# Patient Record
Sex: Female | Born: 1975 | Race: Black or African American | Hispanic: No | Marital: Single | Smoking: Former smoker
Health system: Southern US, Community
[De-identification: ages and names within clinical notes are randomized; demographics above are authoritative.]

## PROBLEM LIST (undated history)

## (undated) ENCOUNTER — Emergency Department (HOSPITAL_COMMUNITY): Admission: EM | Payer: Medicaid Other

## (undated) DIAGNOSIS — R21 Rash and other nonspecific skin eruption: Secondary | ICD-10-CM

## (undated) DIAGNOSIS — M1990 Unspecified osteoarthritis, unspecified site: Secondary | ICD-10-CM

## (undated) DIAGNOSIS — Z8042 Family history of malignant neoplasm of prostate: Secondary | ICD-10-CM

## (undated) DIAGNOSIS — E669 Obesity, unspecified: Secondary | ICD-10-CM

## (undated) DIAGNOSIS — G4730 Sleep apnea, unspecified: Secondary | ICD-10-CM

## (undated) DIAGNOSIS — N946 Dysmenorrhea, unspecified: Secondary | ICD-10-CM

## (undated) DIAGNOSIS — M659 Unspecified synovitis and tenosynovitis, unspecified site: Secondary | ICD-10-CM

## (undated) DIAGNOSIS — Z87898 Personal history of other specified conditions: Secondary | ICD-10-CM

## (undated) DIAGNOSIS — G5600 Carpal tunnel syndrome, unspecified upper limb: Secondary | ICD-10-CM

## (undated) DIAGNOSIS — G5602 Carpal tunnel syndrome, left upper limb: Secondary | ICD-10-CM

## (undated) DIAGNOSIS — R112 Nausea with vomiting, unspecified: Secondary | ICD-10-CM

## (undated) DIAGNOSIS — Z803 Family history of malignant neoplasm of breast: Secondary | ICD-10-CM

## (undated) DIAGNOSIS — K219 Gastro-esophageal reflux disease without esophagitis: Secondary | ICD-10-CM

## (undated) DIAGNOSIS — Z9889 Other specified postprocedural states: Secondary | ICD-10-CM

## (undated) DIAGNOSIS — R51 Headache: Secondary | ICD-10-CM

## (undated) DIAGNOSIS — M722 Plantar fascial fibromatosis: Secondary | ICD-10-CM

## (undated) HISTORY — PX: HERNIA REPAIR: SHX51

## (undated) HISTORY — PX: TOE SURGERY: SHX1073

## (undated) HISTORY — DX: Unspecified synovitis and tenosynovitis, unspecified site: M65.90

## (undated) HISTORY — DX: Dysmenorrhea, unspecified: N94.6

## (undated) HISTORY — DX: Synovitis and tenosynovitis, unspecified: M65.9

## (undated) HISTORY — DX: Family history of malignant neoplasm of breast: Z80.3

## (undated) HISTORY — PX: CHOLECYSTECTOMY: SHX55

## (undated) HISTORY — DX: Unspecified osteoarthritis, unspecified site: M19.90

## (undated) HISTORY — DX: Family history of malignant neoplasm of prostate: Z80.42

## (undated) HISTORY — DX: Plantar fascial fibromatosis: M72.2

## (undated) HISTORY — PX: HYSTEROPLASTY REPAIR OF UTERINE ANOMALY: SUR663

---

## 1997-06-15 ENCOUNTER — Encounter: Admission: RE | Admit: 1997-06-15 | Discharge: 1997-09-13 | Payer: Self-pay | Admitting: Obstetrics & Gynecology

## 1997-11-30 ENCOUNTER — Encounter: Admission: RE | Admit: 1997-11-30 | Discharge: 1998-02-28 | Payer: Self-pay | Admitting: Obstetrics & Gynecology

## 1998-11-15 ENCOUNTER — Emergency Department (HOSPITAL_COMMUNITY): Admission: EM | Admit: 1998-11-15 | Discharge: 1998-11-15 | Payer: Self-pay | Admitting: Emergency Medicine

## 1998-11-15 ENCOUNTER — Encounter: Payer: Self-pay | Admitting: Emergency Medicine

## 1999-05-28 ENCOUNTER — Encounter: Admission: RE | Admit: 1999-05-28 | Discharge: 1999-08-26 | Payer: Self-pay | Admitting: Family Medicine

## 1999-08-25 ENCOUNTER — Other Ambulatory Visit: Admission: RE | Admit: 1999-08-25 | Discharge: 1999-08-25 | Payer: Self-pay | Admitting: Obstetrics

## 1999-11-06 ENCOUNTER — Ambulatory Visit (HOSPITAL_COMMUNITY): Admission: RE | Admit: 1999-11-06 | Discharge: 1999-11-06 | Payer: Self-pay | Admitting: *Deleted

## 1999-11-06 ENCOUNTER — Encounter: Payer: Self-pay | Admitting: *Deleted

## 1999-12-15 ENCOUNTER — Inpatient Hospital Stay (HOSPITAL_COMMUNITY): Admission: AD | Admit: 1999-12-15 | Discharge: 1999-12-15 | Payer: Self-pay | Admitting: Obstetrics & Gynecology

## 2000-01-23 ENCOUNTER — Inpatient Hospital Stay (HOSPITAL_COMMUNITY): Admission: AD | Admit: 2000-01-23 | Discharge: 2000-01-23 | Payer: Self-pay | Admitting: Obstetrics & Gynecology

## 2000-02-11 ENCOUNTER — Ambulatory Visit (HOSPITAL_COMMUNITY): Admission: RE | Admit: 2000-02-11 | Discharge: 2000-02-11 | Payer: Self-pay | Admitting: Obstetrics & Gynecology

## 2000-02-20 ENCOUNTER — Inpatient Hospital Stay (HOSPITAL_COMMUNITY): Admission: AD | Admit: 2000-02-20 | Discharge: 2000-02-20 | Payer: Self-pay | Admitting: Obstetrics and Gynecology

## 2000-02-23 ENCOUNTER — Observation Stay (HOSPITAL_COMMUNITY): Admission: AD | Admit: 2000-02-23 | Discharge: 2000-02-24 | Payer: Self-pay | Admitting: Obstetrics and Gynecology

## 2000-03-08 ENCOUNTER — Inpatient Hospital Stay (HOSPITAL_COMMUNITY): Admission: AD | Admit: 2000-03-08 | Discharge: 2000-03-08 | Payer: Self-pay | Admitting: Obstetrics and Gynecology

## 2000-03-11 ENCOUNTER — Encounter: Payer: Self-pay | Admitting: Obstetrics and Gynecology

## 2000-03-11 ENCOUNTER — Inpatient Hospital Stay (HOSPITAL_COMMUNITY): Admission: RE | Admit: 2000-03-11 | Discharge: 2000-03-15 | Payer: Self-pay | Admitting: Obstetrics and Gynecology

## 2000-03-19 ENCOUNTER — Inpatient Hospital Stay (HOSPITAL_COMMUNITY): Admission: AD | Admit: 2000-03-19 | Discharge: 2000-03-19 | Payer: Self-pay | Admitting: Obstetrics and Gynecology

## 2000-04-23 ENCOUNTER — Encounter: Payer: Self-pay | Admitting: Emergency Medicine

## 2000-04-23 ENCOUNTER — Emergency Department (HOSPITAL_COMMUNITY): Admission: EM | Admit: 2000-04-23 | Discharge: 2000-04-23 | Payer: Self-pay | Admitting: Emergency Medicine

## 2000-06-17 ENCOUNTER — Encounter (HOSPITAL_BASED_OUTPATIENT_CLINIC_OR_DEPARTMENT_OTHER): Payer: Self-pay | Admitting: General Surgery

## 2000-06-21 ENCOUNTER — Encounter (HOSPITAL_BASED_OUTPATIENT_CLINIC_OR_DEPARTMENT_OTHER): Payer: Self-pay | Admitting: General Surgery

## 2000-06-21 ENCOUNTER — Ambulatory Visit (HOSPITAL_COMMUNITY): Admission: RE | Admit: 2000-06-21 | Discharge: 2000-06-22 | Payer: Self-pay | Admitting: General Surgery

## 2000-09-30 ENCOUNTER — Emergency Department (HOSPITAL_COMMUNITY): Admission: EM | Admit: 2000-09-30 | Discharge: 2000-09-30 | Payer: Self-pay

## 2000-10-08 ENCOUNTER — Ambulatory Visit (HOSPITAL_COMMUNITY): Admission: RE | Admit: 2000-10-08 | Discharge: 2000-10-08 | Payer: Self-pay | Admitting: General Surgery

## 2002-02-09 ENCOUNTER — Emergency Department (HOSPITAL_COMMUNITY): Admission: EM | Admit: 2002-02-09 | Discharge: 2002-02-09 | Payer: Self-pay | Admitting: Emergency Medicine

## 2003-11-10 ENCOUNTER — Inpatient Hospital Stay (HOSPITAL_COMMUNITY): Admission: AD | Admit: 2003-11-10 | Discharge: 2003-11-10 | Payer: Self-pay | Admitting: Obstetrics and Gynecology

## 2003-12-17 ENCOUNTER — Ambulatory Visit (HOSPITAL_COMMUNITY): Admission: RE | Admit: 2003-12-17 | Discharge: 2003-12-17 | Payer: Self-pay | Admitting: Obstetrics and Gynecology

## 2003-12-24 ENCOUNTER — Inpatient Hospital Stay (HOSPITAL_COMMUNITY): Admission: RE | Admit: 2003-12-24 | Discharge: 2003-12-27 | Payer: Self-pay | Admitting: Obstetrics and Gynecology

## 2007-01-24 HISTORY — PX: COLONOSCOPY: SHX174

## 2010-03-15 ENCOUNTER — Encounter: Payer: Self-pay | Admitting: Obstetrics and Gynecology

## 2010-05-21 ENCOUNTER — Emergency Department (HOSPITAL_COMMUNITY)
Admission: EM | Admit: 2010-05-21 | Discharge: 2010-05-21 | Disposition: A | Discharge status: Home / Self Care | Payer: Self-pay | Attending: Emergency Medicine | Admitting: Emergency Medicine

## 2010-05-21 DIAGNOSIS — K047 Periapical abscess without sinus: Secondary | ICD-10-CM | POA: Insufficient documentation

## 2010-05-21 DIAGNOSIS — K089 Disorder of teeth and supporting structures, unspecified: Secondary | ICD-10-CM | POA: Insufficient documentation

## 2010-05-21 DIAGNOSIS — K029 Dental caries, unspecified: Secondary | ICD-10-CM | POA: Insufficient documentation

## 2010-07-24 ENCOUNTER — Inpatient Hospital Stay (HOSPITAL_COMMUNITY)
Admission: AD | Admit: 2010-07-24 | Discharge: 2010-07-24 | Disposition: A | Discharge status: Home / Self Care | Payer: Self-pay | Source: Ambulatory Visit | Attending: Obstetrics & Gynecology | Admitting: Obstetrics & Gynecology

## 2010-07-24 ENCOUNTER — Inpatient Hospital Stay (HOSPITAL_COMMUNITY): Payer: Self-pay

## 2010-07-24 DIAGNOSIS — N949 Unspecified condition associated with female genital organs and menstrual cycle: Secondary | ICD-10-CM

## 2010-07-24 DIAGNOSIS — N938 Other specified abnormal uterine and vaginal bleeding: Secondary | ICD-10-CM | POA: Insufficient documentation

## 2010-07-24 LAB — WET PREP, GENITAL
Clue Cells Wet Prep HPF POC: NONE SEEN
Trich, Wet Prep: NONE SEEN
Yeast Wet Prep HPF POC: NONE SEEN

## 2010-07-24 LAB — CBC
HCT: 42.4 % (ref 36.0–46.0)
Hemoglobin: 13.7 g/dL (ref 12.0–15.0)
MCH: 28.9 pg (ref 26.0–34.0)
MCHC: 32.3 g/dL (ref 30.0–36.0)
MCV: 89.5 fL (ref 78.0–100.0)
Platelets: 304 10*3/uL (ref 150–400)
RBC: 4.74 MIL/uL (ref 3.87–5.11)
RDW: 14.1 % (ref 11.5–15.5)
WBC: 13.1 10*3/uL — ABNORMAL HIGH (ref 4.0–10.5)

## 2010-07-24 LAB — URINE MICROSCOPIC-ADD ON

## 2010-07-24 LAB — URINALYSIS, ROUTINE W REFLEX MICROSCOPIC
Bilirubin Urine: NEGATIVE
Glucose, UA: NEGATIVE mg/dL
Ketones, ur: NEGATIVE mg/dL
Nitrite: NEGATIVE
Protein, ur: NEGATIVE mg/dL
Specific Gravity, Urine: 1.025 (ref 1.005–1.030)
Urobilinogen, UA: 0.2 mg/dL (ref 0.0–1.0)
pH: 6 (ref 5.0–8.0)

## 2010-07-24 LAB — POCT PREGNANCY, URINE: Preg Test, Ur: NEGATIVE

## 2010-07-25 LAB — GC/CHLAMYDIA PROBE AMP, GENITAL
Chlamydia, DNA Probe: NEGATIVE
GC Probe Amp, Genital: NEGATIVE

## 2010-09-03 ENCOUNTER — Encounter: Payer: Self-pay | Admitting: Obstetrics and Gynecology

## 2010-10-16 ENCOUNTER — Ambulatory Visit: Payer: Self-pay | Admitting: Obstetrics and Gynecology

## 2010-11-07 ENCOUNTER — Encounter: Payer: Self-pay | Admitting: Obstetrics and Gynecology

## 2010-11-20 HISTORY — PX: WISDOM TOOTH EXTRACTION: SHX21

## 2010-12-03 ENCOUNTER — Other Ambulatory Visit (HOSPITAL_COMMUNITY)
Admission: RE | Admit: 2010-12-03 | Discharge: 2010-12-03 | Disposition: A | Discharge status: Home / Self Care | Payer: Medicaid Other | Source: Ambulatory Visit | Attending: Obstetrics and Gynecology | Admitting: Obstetrics and Gynecology

## 2010-12-03 ENCOUNTER — Ambulatory Visit (INDEPENDENT_AMBULATORY_CARE_PROVIDER_SITE_OTHER): Payer: Medicaid Other | Admitting: Obstetrics and Gynecology

## 2010-12-03 ENCOUNTER — Encounter: Payer: Self-pay | Admitting: Obstetrics and Gynecology

## 2010-12-03 VITALS — BP 130/83 | HR 89 | Temp 98.7°F | Ht 65.0 in | Wt 363.2 lb

## 2010-12-03 DIAGNOSIS — N949 Unspecified condition associated with female genital organs and menstrual cycle: Secondary | ICD-10-CM

## 2010-12-03 DIAGNOSIS — Z01812 Encounter for preprocedural laboratory examination: Secondary | ICD-10-CM

## 2010-12-03 DIAGNOSIS — N938 Other specified abnormal uterine and vaginal bleeding: Secondary | ICD-10-CM

## 2010-12-03 DIAGNOSIS — Z01419 Encounter for gynecological examination (general) (routine) without abnormal findings: Secondary | ICD-10-CM | POA: Insufficient documentation

## 2010-12-03 LAB — POCT PREGNANCY, URINE: Preg Test, Ur: NEGATIVE

## 2010-12-03 LAB — CBC
HCT: 34.8 % — ABNORMAL LOW (ref 36.0–46.0)
Hemoglobin: 10.8 g/dL — ABNORMAL LOW (ref 12.0–15.0)
MCH: 27 pg (ref 26.0–34.0)
MCHC: 31 g/dL (ref 30.0–36.0)
MCV: 87 fL (ref 78.0–100.0)
Platelets: 406 10*3/uL — ABNORMAL HIGH (ref 150–400)
RBC: 4 MIL/uL (ref 3.87–5.11)
RDW: 14.6 % (ref 11.5–15.5)
WBC: 9.9 10*3/uL (ref 4.0–10.5)

## 2010-12-03 MED ORDER — MEDROXYPROGESTERONE ACETATE 150 MG/ML IM SUSP
150.0000 mg | Freq: Once | INTRAMUSCULAR | Status: AC
  Administered 2010-12-03: 150 mg via INTRAMUSCULAR

## 2010-12-03 NOTE — Progress Notes (Signed)
Addended by: Lynnell Dike on: 12/03/2010 02:59 PM   Modules accepted: Orders

## 2010-12-03 NOTE — Progress Notes (Signed)
History   Dawn Galvan is a 35 year old G2P2 who presents today with complaint of dysfunctional uterine bleeding. The patient was referred from [facility] after being seen in May of this year for the same complaint. The bleeding started in May and has lightened slightly on occasion but never completely stopped still at the time of appointment. The bleeding is heavy and the patient also endorses passing large clots up to the size of a golf ball. She states that she has had to wear 2-3 pads at a time in order to avoid leaking blood during the day. She has to change her pads often, however the duration of time varies depending on the number of clots passed. The patient also reports fatigue, dizziness and lightheadedness over the past couple of months.   Chief Complaint  Patient presents with  . Menorrhagia    bleeding since May, large clots    Pertinent Gynecological History: Menses: flow is excessive with use of multiple pads or tampons on heaviest days Bleeding: dysfunctional uterine bleeding Contraception: none DES exposure: unknown Blood transfusions: none Sexually transmitted diseases: not currently sexually active Previous GYN Procedures: uterine repair following birth of second child  Last pap: patient unsure. performed today    Past Medical History  Diagnosis Date  . Abnormal Pap smear of cervix years ago    have been normal since    Past Surgical History  Procedure Date  . Wisdom tooth extraction 11/20/10    all four extracted  . Toe surgery     broken toe  . Cesarean section   . Cholecystectomy   . Hernia repair   . Hernia repair   . Hysteroplasty repair of uterine anomaly     Family History  Problem Relation Age of Onset  . Hypertension Mother   . Hypertension Father   . Diabetes Maternal Grandmother   . Diabetes Paternal Grandmother     History  Substance Use Topics  . Smoking status: Former Games developer  . Smokeless tobacco: Never Used  . Alcohol Use: No    Allergies:  No Known Allergies   (Not in a hospital admission)  Review of Systems - Negative except as noted in HPI  Physical Exam Blood pressure 130/83, pulse 89, temperature 98.7 F (37.1 C), temperature source Oral, height 5\' 5"  (1.651 m), weight 363 lb 3.2 oz (164.746 kg), last menstrual period 07/24/2010. General: Well-developed, morbidly obese female in no acute distress HEENT: Normocephalic, atraumatic. PERRLA  Female genitalia: Vagina: not indicated and normal appearing vagina with normal color and discharge, no lesions Cervix: not indicated and normal appearing cervix without discharge or lesions exam limited by obesity difficult to visualize the entire cervix.   Assessment: 1. DUB  Plan The endometrial biopsy was unable to be performed today due to difficulty visualizing the cervix secondary to body habitus. The pap smear was obtained and sent for cytology. The patient will have a CBC drawn today. She will also receive Depo Provera 150 mg IM today in an attempt to stop her bleeding. She is asked to follow-up in 2 weeks if she has not stopped bleeding or in 3 months for her next dose of Depo Provera if her bleeding is well-controlled.

## 2010-12-19 ENCOUNTER — Ambulatory Visit: Payer: Medicaid Other | Admitting: Family

## 2011-02-04 ENCOUNTER — Ambulatory Visit: Payer: Medicaid Other | Admitting: Advanced Practice Midwife

## 2011-02-19 ENCOUNTER — Ambulatory Visit (INDEPENDENT_AMBULATORY_CARE_PROVIDER_SITE_OTHER): Payer: Medicaid Other | Admitting: *Deleted

## 2011-02-19 VITALS — BP 123/83 | HR 86

## 2011-02-19 DIAGNOSIS — Z3049 Encounter for surveillance of other contraceptives: Secondary | ICD-10-CM

## 2011-02-19 MED ORDER — MEDROXYPROGESTERONE ACETATE 150 MG/ML IM SUSP
150.0000 mg | Freq: Once | INTRAMUSCULAR | Status: AC
  Administered 2011-02-19: 150 mg via INTRAMUSCULAR

## 2011-05-01 ENCOUNTER — Ambulatory Visit (INDEPENDENT_AMBULATORY_CARE_PROVIDER_SITE_OTHER): Payer: Medicaid Other | Admitting: *Deleted

## 2011-05-01 VITALS — BP 141/89 | HR 84 | Temp 97.9°F | Ht 64.0 in | Wt 353.4 lb

## 2011-05-01 DIAGNOSIS — Z3049 Encounter for surveillance of other contraceptives: Secondary | ICD-10-CM

## 2011-05-01 MED ORDER — MEDROXYPROGESTERONE ACETATE 150 MG/ML IM SUSP
150.0000 mg | Freq: Once | INTRAMUSCULAR | Status: AC
  Administered 2011-05-01: 150 mg via INTRAMUSCULAR

## 2011-05-07 ENCOUNTER — Ambulatory Visit: Payer: Medicaid Other

## 2011-05-27 ENCOUNTER — Ambulatory Visit: Payer: Medicaid Other | Admitting: Obstetrics and Gynecology

## 2011-06-11 ENCOUNTER — Ambulatory Visit: Payer: Medicaid Other | Admitting: Physician Assistant

## 2011-06-12 ENCOUNTER — Encounter: Payer: Self-pay | Admitting: Obstetrics and Gynecology

## 2011-06-12 ENCOUNTER — Ambulatory Visit (INDEPENDENT_AMBULATORY_CARE_PROVIDER_SITE_OTHER): Payer: Medicaid Other | Admitting: Obstetrics and Gynecology

## 2011-06-12 VITALS — BP 141/85 | HR 90 | Ht 64.0 in | Wt 357.7 lb

## 2011-06-12 DIAGNOSIS — E669 Obesity, unspecified: Secondary | ICD-10-CM

## 2011-06-12 DIAGNOSIS — N920 Excessive and frequent menstruation with regular cycle: Secondary | ICD-10-CM

## 2011-06-12 DIAGNOSIS — N939 Abnormal uterine and vaginal bleeding, unspecified: Secondary | ICD-10-CM

## 2011-06-12 DIAGNOSIS — N898 Other specified noninflammatory disorders of vagina: Secondary | ICD-10-CM

## 2011-06-12 MED ORDER — MISOPROSTOL 200 MCG PO TABS: 200.0000 ug | ORAL_TABLET | ORAL | Status: DC

## 2011-06-12 NOTE — Progress Notes (Signed)
Patient ID: Dawn Galvan, female   DOB: Apr 06, 1975, 36 y.o.   MRN: [id number] Dawn Galvan [id number] Chief Complaint  Patient presents with  . Discuss Depo    bleeds 2-3 weeks before next shot due      SUBJECTIVE  HPI: This 36 yo morbidly obese AAF G2P2002 has been followed for menorrhagia since 06/2010 and reports onset of bleeding 2-3 weeks before receiving her Depo Provera injection in Feb 2013. No heavy bleeding or othostatic sx and not bleeding currently. She was seen here Oct 2012 for menorrhagia and endometrial biopsy attempted by Dr. Okey Dupre was unsuccessful. At that time she had hgb  80f 10.8, neg UPT, Pap (neg) and received first Depo Provera. In May 2012 she had normal pelvic US with thin homogenous stripe and neg WP, GC and CT. After that visit she was briefly on OCPs to control bleeding but did not tolerate SE (nausea). She is in a mutually monogamous relationship and does not desire any more children. She is not taking iron supplement and to her knowledge has not had TSH. No PCP.   Past Medical History  Diagnosis Date  . Abnormal Pap smear of cervix years ago    have been normal since   OB hx significant for NSVD, then C/S for uterine rupture in second stage labor. Past Surgical History  Procedure Date  . Wisdom tooth extraction 11/20/10    all four extracted  . Toe surgery     broken toe  . Cesarean section   . Cholecystectomy   . Hernia repair   . Hernia repair   . Hysteroplasty repair of uterine anomaly    History   Social History  . Marital Status: Single    Spouse Name: N/A    Number of Children: N/A  . Years of Education: N/A   Occupational History  . Not on file.   Social History Main Topics  . Smoking status: Former Games developer  . Smokeless tobacco: Never Used  . Alcohol Use: No  . Drug Use: No  . Sexually Active: Not Currently   Other Topics Concern  . Not on file   Social History Narrative  . No narrative on file   Current Outpatient  Prescriptions on File Prior to Visit  Medication Sig Dispense Refill  . diphenhydrAMINE (BENADRYL) 25 mg capsule Take 25 mg by mouth every 6 (six) hours as needed.      . medroxyPROGESTERone (DEPO-PROVERA) 150 MG/ML injection Inject 150 mg into the muscle every 3 (three) months.      Marland Kitchen ibuprofen (ADVIL,MOTRIN) 800 MG tablet Take 800 mg by mouth every 8 (eight) hours as needed.        . misoprostol (CYTOTEC) 200 MCG tablet Take 1 tablet (200 mcg total) by mouth 1 day or 1 dose.  3 tablet  0  . oxyCODONE-acetaminophen (PERCOCET) 5-325 MG per tablet Take 1 tablet by mouth every 4 (four) hours as needed.         No Known Allergies  ROS: Pertinent items in HPI  OBJECTIVE Blood pressure 141/85, pulse 90, height 5\' 4"  (1.626 m), weight 162.252 kg (357 lb 11.2 oz). GENERAL: Well-developed, obese female in no acute distress.  Exam deferred    ASSESSMENT Morbid obesity Menorrhagia with bleeding while on DepoProvera  PLAN  Start iron 325 mg 1 tab bid Weight loss encouraged TSH sent Rx cytotetc 200 mg 3 po to take the night before next visit here for endometrial biopsy. C/W Dr. Marice Potter who  will do the biopsy and suggests endometrial ablation as a good option. Info given to pt.    Return in about 3 weeks (around 07/03/2011).    [name] 06/12/2011 2:14 PM

## 2011-06-12 NOTE — Patient Instructions (Signed)
Endometrial Ablation Endometrial ablation removes the lining of the uterus (endometrium). It is usually a same day, outpatient treatment. Ablation helps avoid major surgery (such as a hysterectomy). A hysterectomy is removal of the cervix and uterus. Endometrial ablation has less risk and complications, has a shorter recovery period and is less expensive. After endometrial ablation, most women will have little or no menstrual bleeding. You may not keep your fertility. Pregnancy is no longer likely after this procedure but if you are pre-menopausal, you still need to use a reliable method of birth control following the procedure because pregnancy can occur. REASONS TO HAVE THE PROCEDURE MAY INCLUDE:  Heavy periods.   Bleeding that is causing anemia.   Anovulatory bleeding, very irregular, bleeding.   Bleeding submucous fibroids (on the lining inside the uterus) if they are smaller than 3 centimeters.  REASONS NOT TO HAVE THE PROCEDURE MAY INCLUDE:  You wish to have more children.   You have a pre-cancerous or cancerous problem. The cause of any abnormal bleeding must be diagnosed before having the procedure.   You have pain coming from the uterus.   You have a submucus fibroid larger than 3 centimeters.   You recently had a baby.   You recently had an infection in the uterus.   You have a severe retro-flexed, tipped uterus and cannot insert the instrument to do the ablation.   You had a Cesarean section or deep major surgery on the uterus.   The inner cavity of the uterus is too large for the endometrial ablation instrument.  RISKS AND COMPLICATIONS   Perforation of the uterus.   Bleeding.   Infection of the uterus, bladder or vagina.   Injury to surrounding organs.   Cutting the cervix.   An air bubble to the lung (air embolus).   Pregnancy following the procedure.   Failure of the procedure to help the problem requiring hysterectomy.   Decreased ability to diagnose  cancer in the lining of the uterus.  BEFORE THE PROCEDURE  The lining of the uterus must be tested to make sure there is no pre-cancerous or cancer cells present.   Medications may be given to make the lining of the uterus thinner.   Ultrasound may be used to evaluate the size and look for abnormalities of the uterus.   Future pregnancy is not desired.  PROCEDURE  There are different ways to destroy the lining of the uterus.   Resectoscope - radio frequency-alternating electric current is the most common one used.   Cryotherapy - freezing the lining of the uterus.   Heated Free Liquid - heated salt (saline) solution inserted into the uterus.   Microwave - uses high energy microwaves in the uterus.   Thermal Balloon - a catheter with a balloon tip is inserted into the uterus and filled with heated fluid.  Your caregiver will talk with you about the method used in this clinic. They will also instruct you on the pros and cons of the procedure. Endometrial ablation is performed along with a procedure called operative hysteroscopy. A narrow viewing tube is inserted through the birth canal (vagina) and through the cervix into the uterus. A tiny camera attached to the viewing tube (hysteroscope) allows the uterine cavity to be shown on a TV monitor during surgery. Your uterus is filled with a harmless liquid to make the procedure easier. The lining of the uterus is then removed. The lining can also be removed with a resectoscope which allows your surgeon   to cut away the lining of the uterus under direct vision. Usually, you will be able to go home within an hour after the procedure. HOME CARE INSTRUCTIONS   Do not drive for 24 hours.   No tampons, douching or intercourse for 2 weeks or until your caregiver approves.   Rest at home for 24 to 48 hours. You may then resume normal activities unless told differently by your caregiver.   Take your temperature two times a day for 4 days, and record  it.   Take any medications your caregiver has ordered, as directed.   Use some form of contraception if you are pre-menopausal and do not want to get pregnant.  Bleeding after the procedure is normal. It varies from light spotting and mildly watery to bloody discharge for 4 to 6 weeks. You may also have mild cramping. Only take over-the-counter or prescription medicines for pain, discomfort, or fever as directed by your caregiver. Do not use aspirin, as this may aggravate bleeding. Frequent urination during the first 24 hours is normal. You will not know how effective your surgery is until at least 3 months after the surgery. SEEK IMMEDIATE MEDICAL CARE IF:   Bleeding is heavier than a normal menstrual cycle.   An oral temperature above 102 F (38.9 C) develops.   You have increasing cramps or pains not relieved with medication or develop belly (abdominal) pain which does not seem to be related to the same area of earlier cramping and pain.   You are light headed, weak or have fainting episodes.   You develop pain in the shoulder strap areas.   You have chest or leg pain.   You have abnormal vaginal discharge.   You have painful urination.  Document Released: 12/20/2003 Document Revised: 01/29/2011 Document Reviewed: 03/19/2007 [employer] Patient Information 2012 [employer]. 

## 2011-06-22 ENCOUNTER — Telehealth: Payer: Self-pay | Admitting: *Deleted

## 2011-06-22 NOTE — Telephone Encounter (Signed)
Patient left a message stating that at her last visit she was told to take iron but was not given a prescription. She also wanted to know if she could have a refill on her pain medicine.

## 2011-06-24 NOTE — Telephone Encounter (Signed)
Called pt and spoke w/gentleman who stated that Anihya would be available after 3pm today. I stated that we would call back.  Per Deirdre's not from 4/19, pt can be told to take ferrous sulfate 325 mg po BID. Also, pt may have Rx for ibuprofen sent per standing orders.

## 2011-06-24 NOTE — Telephone Encounter (Signed)
Pt left message @ 1554 that she was returning the nurse's call. I called pt and left message that I was calling to discuss her pain and to instruct her on how to take the recommended iron.  I will call back tomorrow.

## 2011-06-25 NOTE — Telephone Encounter (Signed)
Called pt and left message that I eas calling her back to discuss her pain and answer her question.

## 2011-06-26 NOTE — Telephone Encounter (Signed)
Patient called and left a message that she is unable to call us until after 3 pm, or receive a call until that time.

## 2011-07-02 MED ORDER — IBUPROFEN 800 MG PO TABS: 800.0000 mg | ORAL_TABLET | Freq: Three times a day (TID) | ORAL | Status: DC | PRN

## 2011-07-02 NOTE — Telephone Encounter (Signed)
Pt called [place] back and stated that she is off work all day today and can talk anytime. I called her back and she answered and then put me on hold and hung up.

## 2011-07-02 NOTE — Telephone Encounter (Signed)
Called pt and I advised pt that she can get the iron otc.  Pt stated that she is tired of the bleeding and that she is having severe cramping.  Pt wanted something for pain.  Per Leola Brazil, pt can have ibuprofen 800 mg prescribed.  I informed pt that it would be sent to pharmacy in which I verified with the pt.  I also informed pt to take the cytotec medication the night before her appt 07/16/11 so they will be able to do the endo bx.  Pt stated understanding and had no further questions.

## 2011-07-16 ENCOUNTER — Telehealth: Payer: Self-pay | Admitting: *Deleted

## 2011-07-16 ENCOUNTER — Ambulatory Visit: Payer: Medicaid Other | Admitting: Obstetrics & Gynecology

## 2011-07-16 NOTE — Telephone Encounter (Signed)
Patient called very upset that no one explained the side effects of cytotec to her. Patient states having severe cramping and diarrhea and wanted pain med called in to pharmacy. Advised patient that she could speak to physician at her appointment today about pain med. Patient states she was ill and our office would just have to deal with that today and hung up.

## 2011-07-17 ENCOUNTER — Ambulatory Visit (INDEPENDENT_AMBULATORY_CARE_PROVIDER_SITE_OTHER): Payer: Medicaid Other | Admitting: Medical

## 2011-07-17 DIAGNOSIS — N920 Excessive and frequent menstruation with regular cycle: Secondary | ICD-10-CM

## 2011-07-17 MED ORDER — MEDROXYPROGESTERONE ACETATE 150 MG/ML IM SUSP
150.0000 mg | INTRAMUSCULAR | Status: DC
  Administered 2011-07-17 – 2011-10-15 (×2): 150 mg via INTRAMUSCULAR

## 2011-08-19 ENCOUNTER — Ambulatory Visit: Payer: Medicaid Other | Admitting: Obstetrics & Gynecology

## 2011-10-05 ENCOUNTER — Ambulatory Visit: Payer: Medicaid Other

## 2011-10-07 ENCOUNTER — Telehealth: Payer: Self-pay | Admitting: *Deleted

## 2011-10-07 MED ORDER — IBUPROFEN 800 MG PO TABS: 800.0000 mg | ORAL_TABLET | Freq: Three times a day (TID) | ORAL | Status: DC | PRN

## 2011-10-07 NOTE — Telephone Encounter (Signed)
Pt wants a refill on ibuprofen 800 mg to help with her cramps. She is coming in for endo bx with Dr. Marice Potter on 8/21

## 2011-10-07 NOTE — Telephone Encounter (Signed)
Called pt and informed her that her refill has been sent to her pharmacy.  Pt voiced understanding.

## 2011-10-14 ENCOUNTER — Ambulatory Visit: Payer: Medicaid Other | Admitting: Obstetrics & Gynecology

## 2011-10-15 ENCOUNTER — Ambulatory Visit (INDEPENDENT_AMBULATORY_CARE_PROVIDER_SITE_OTHER): Payer: Medicaid Other

## 2011-10-15 VITALS — BP 143/89 | HR 79 | Wt 357.9 lb

## 2011-10-15 DIAGNOSIS — N920 Excessive and frequent menstruation with regular cycle: Secondary | ICD-10-CM

## 2011-11-11 ENCOUNTER — Other Ambulatory Visit: Payer: Medicaid Other | Admitting: Obstetrics & Gynecology

## 2011-11-30 ENCOUNTER — Other Ambulatory Visit: Payer: Medicaid Other | Admitting: Obstetrics & Gynecology

## 2011-12-01 ENCOUNTER — Ambulatory Visit (INDEPENDENT_AMBULATORY_CARE_PROVIDER_SITE_OTHER): Payer: Medicaid Other

## 2011-12-01 DIAGNOSIS — N390 Urinary tract infection, site not specified: Secondary | ICD-10-CM

## 2011-12-01 LAB — POCT URINALYSIS DIP (DEVICE)
Bilirubin Urine: NEGATIVE
Glucose, UA: NEGATIVE mg/dL
Ketones, ur: NEGATIVE mg/dL
Nitrite: NEGATIVE
Protein, ur: 30 mg/dL — AB
Specific Gravity, Urine: 1.025 (ref 1.005–1.030)
Urobilinogen, UA: 0.2 mg/dL (ref 0.0–1.0)
pH: 6 (ref 5.0–8.0)

## 2011-12-01 MED ORDER — SULFAMETHOXAZOLE-TRIMETHOPRIM 800-160 MG PO TABS: 1.0000 | ORAL_TABLET | Freq: Two times a day (BID) | ORAL | Status: AC

## 2011-12-01 MED ORDER — FLUCONAZOLE 150 MG PO TABS: 150.0000 mg | ORAL_TABLET | Freq: Once | ORAL | Status: DC

## 2011-12-01 NOTE — Progress Notes (Signed)
Pt brought urine in for burning during urination, urinary frequency.  Rx Septra DS according to protocol and ordered diflucan cause pt stated she gets a yeast infection after taking antibiotics.  Will send urine off for culture and I informed pt that if she its another bacteria that her current antibiotic does not tx will give her a return call back.  Pt stated understanding and had no further questions.

## 2011-12-03 LAB — URINE CULTURE: Colony Count: 30000

## 2011-12-07 ENCOUNTER — Telehealth: Payer: Self-pay | Admitting: *Deleted

## 2011-12-07 DIAGNOSIS — R102 Pelvic and perineal pain: Secondary | ICD-10-CM

## 2011-12-07 NOTE — Telephone Encounter (Signed)
Patient left a message requesting that we please refill her ibuprofen. She states that she spoke with a nurse last week that told her to just use otc ibuprofen but she states that this is not strong enough. Her pain is very severe and she needs to have the rx strength.

## 2011-12-08 MED ORDER — IBUPROFEN 800 MG PO TABS: 800.0000 mg | ORAL_TABLET | Freq: Three times a day (TID) | ORAL | Status: DC | PRN

## 2011-12-08 NOTE — Telephone Encounter (Signed)
Will route to house provider, Dr. Macon Large.

## 2011-12-08 NOTE — Telephone Encounter (Signed)
Rx for Ibuprofen refilled. Please call patient and advise her to pick up prescription.

## 2011-12-09 NOTE — Telephone Encounter (Signed)
Called patient and left her a message to return my call.  

## 2011-12-09 NOTE — Telephone Encounter (Signed)
Called pt and informed her that her Rx has been sent to her pharmacy as requested. Pt voiced understanding.

## 2011-12-28 ENCOUNTER — Telehealth: Payer: Self-pay | Admitting: *Deleted

## 2011-12-28 NOTE — Telephone Encounter (Signed)
Pt left message requesting a call back from the nurse.  

## 2011-12-29 NOTE — Telephone Encounter (Signed)
Spoke with patient. She was with someone at this time. She did not want to discuss her concerns and will call back at a more convenient time for her to talk.

## 2011-12-30 NOTE — Telephone Encounter (Signed)
Called pt and pt stated that she is having an odor.  I advised pt to try Refresh otc for a week to see if it gives some comfort as far as the odor and if does not to please schedule an appt to be evaluated by a provider.  Pt stated understanding.

## 2011-12-31 ENCOUNTER — Other Ambulatory Visit (HOSPITAL_COMMUNITY)
Admission: RE | Admit: 2011-12-31 | Discharge: 2011-12-31 | Disposition: A | Discharge status: Home / Self Care | Payer: Medicaid Other | Source: Ambulatory Visit | Attending: Obstetrics & Gynecology | Admitting: Obstetrics & Gynecology

## 2011-12-31 ENCOUNTER — Encounter: Payer: Self-pay | Admitting: Obstetrics & Gynecology

## 2011-12-31 ENCOUNTER — Ambulatory Visit: Payer: Medicaid Other

## 2011-12-31 ENCOUNTER — Ambulatory Visit (INDEPENDENT_AMBULATORY_CARE_PROVIDER_SITE_OTHER): Payer: Medicaid Other | Admitting: Obstetrics & Gynecology

## 2011-12-31 VITALS — BP 140/87 | HR 103 | Temp 97.5°F | Ht 65.0 in | Wt 363.0 lb

## 2011-12-31 DIAGNOSIS — N938 Other specified abnormal uterine and vaginal bleeding: Secondary | ICD-10-CM

## 2011-12-31 DIAGNOSIS — N760 Acute vaginitis: Secondary | ICD-10-CM | POA: Insufficient documentation

## 2011-12-31 DIAGNOSIS — N949 Unspecified condition associated with female genital organs and menstrual cycle: Secondary | ICD-10-CM

## 2011-12-31 DIAGNOSIS — N898 Other specified noninflammatory disorders of vagina: Secondary | ICD-10-CM

## 2011-12-31 DIAGNOSIS — B9689 Other specified bacterial agents as the cause of diseases classified elsewhere: Secondary | ICD-10-CM

## 2011-12-31 DIAGNOSIS — E669 Obesity, unspecified: Secondary | ICD-10-CM

## 2011-12-31 DIAGNOSIS — A499 Bacterial infection, unspecified: Secondary | ICD-10-CM

## 2011-12-31 DIAGNOSIS — Z01812 Encounter for preprocedural laboratory examination: Secondary | ICD-10-CM

## 2011-12-31 LAB — POCT PREGNANCY, URINE: Preg Test, Ur: NEGATIVE

## 2011-12-31 MED ORDER — MEDROXYPROGESTERONE ACETATE 150 MG/ML IM SUSP
300.0000 mg | INTRAMUSCULAR | Status: DC
  Administered 2011-12-31: 300 mg via INTRAMUSCULAR

## 2011-12-31 NOTE — Progress Notes (Signed)
History:  36 y.o. Dawn Galvan here today for evaluation of ongoing DUB. She has been on Depo Provera for a few years.   Also reports malodorous vaginal discharge.  The following portions of the patient's history were reviewed and updated as appropriate: allergies, current medications, past family history, past medical history, past social history, past surgical history and problem list.  Review of Systems:  Pertinent items are noted in HPI.  Objective:  Physical Exam Blood pressure 140/87, pulse 103, temperature 97.5 F (36.4 C), temperature source Oral, height 5\' 5"  (1.651 m), weight 363 lb (164.656 kg), last menstrual period 11/24/2011. Gen: NAD Abd: Soft, nontender and nondistended, morbidly obese habitus Pelvic: Normal appearing external genitalia; normal appearing vaginal mucosa.  Long vagina, used long speculum. Cervix tilted posteriorly.  Brown discharge seen.  Sample taken for wet prep.   ENDOMETRIAL BIOPSY     The indications for endometrial biopsy were reviewed.   Risks of the biopsy including cramping, bleeding, infection, uterine perforation, inadequate specimen and need for additional procedures  were discussed. The patient states she understands and agrees to undergo procedure today. Consent was signed. Time out was performed. Urine HCG was negative. A single-toothed tenaculum was placed on the anterior lip of the cervix to stabilize it. The 3 mm pipelle was attempted to be introduced into the endometrial cavity, unable to pass through internal os easily.  Patient did not tolerate procedure attempt well, asked me to stop the biopsy.   The instruments were removed from the patient's vagina. Minimal bleeding from the cervix was noted.   Assessment & Plan:  Will attempt endometrial biopsy in the OR under anesthesia, will do hysteroscopy, D&C in the OR. Depo Provera 300mg  IM given; the 150 mg dose may have been insufficient Follow up wet prep and manage accordingly Patient will be  contacted by surgical scheduler about her hysteroscopy, D&C

## 2011-12-31 NOTE — Patient Instructions (Signed)
Return to clinic for any scheduled appointments or for any gynecologic concerns as needed.   

## 2012-01-04 ENCOUNTER — Telehealth: Payer: Self-pay | Admitting: Obstetrics and Gynecology

## 2012-01-04 ENCOUNTER — Encounter (HOSPITAL_COMMUNITY): Payer: Self-pay | Admitting: Pharmacist

## 2012-01-04 DIAGNOSIS — N760 Acute vaginitis: Secondary | ICD-10-CM

## 2012-01-04 DIAGNOSIS — B9689 Other specified bacterial agents as the cause of diseases classified elsewhere: Secondary | ICD-10-CM

## 2012-01-04 DIAGNOSIS — N390 Urinary tract infection, site not specified: Secondary | ICD-10-CM

## 2012-01-04 MED ORDER — TINIDAZOLE 500 MG PO TABS: 2.0000 g | ORAL_TABLET | Freq: Every day | ORAL | Status: DC

## 2012-01-04 MED ORDER — FLUCONAZOLE 150 MG PO TABS: 150.0000 mg | ORAL_TABLET | Freq: Once | ORAL | Status: DC

## 2012-01-04 NOTE — Telephone Encounter (Signed)
Patient called back and notified of BV result and Rx. Patient states she gets yeast infection every time after taking antibiotic- patient request Diflucan. Sent to pharmacy on file. Patient agrees and satisfied.

## 2012-01-04 NOTE — Telephone Encounter (Signed)
Called patient in regard of BV result and Rx as directed by Dr. Macon Large. No Answer; left message to call clinic back.

## 2012-01-04 NOTE — Addendum Note (Signed)
Addended by: Jaynie Collins A on: 01/04/2012 09:41 AM   Modules accepted: Orders

## 2012-01-04 NOTE — Telephone Encounter (Signed)
Message copied by Toula Moos on Mon Jan 04, 2012  1:49 PM ------      Message from: Jaynie Collins A      Created: Mon Jan 04, 2012  9:42 AM       Wet prep showed BV, Tindamax e-prescribed to pharmacy on file.  Patient will be contacted regarding her procedure time and date this week.  Please let her know to pick up prescription.

## 2012-01-05 ENCOUNTER — Ambulatory Visit (HOSPITAL_COMMUNITY): Payer: Medicaid Other

## 2012-01-12 ENCOUNTER — Encounter (HOSPITAL_COMMUNITY)
Admission: RE | Admit: 2012-01-12 | Discharge: 2012-01-12 | Disposition: A | Discharge status: Home / Self Care | Payer: Medicaid Other | Source: Ambulatory Visit | Attending: Obstetrics & Gynecology | Admitting: Obstetrics & Gynecology

## 2012-01-12 ENCOUNTER — Ambulatory Visit (HOSPITAL_COMMUNITY)
Admission: RE | Admit: 2012-01-12 | Discharge: 2012-01-12 | Disposition: A | Discharge status: Home / Self Care | Payer: Medicaid Other | Source: Ambulatory Visit | Attending: Obstetrics & Gynecology | Admitting: Obstetrics & Gynecology

## 2012-01-12 ENCOUNTER — Encounter (HOSPITAL_COMMUNITY): Payer: Self-pay

## 2012-01-12 DIAGNOSIS — N938 Other specified abnormal uterine and vaginal bleeding: Secondary | ICD-10-CM

## 2012-01-12 DIAGNOSIS — N949 Unspecified condition associated with female genital organs and menstrual cycle: Secondary | ICD-10-CM | POA: Insufficient documentation

## 2012-01-12 HISTORY — DX: Obesity, unspecified: E66.9

## 2012-01-12 HISTORY — DX: Headache: R51

## 2012-01-12 LAB — CBC
HCT: 39.2 % (ref 36.0–46.0)
Hemoglobin: 12.6 g/dL (ref 12.0–15.0)
RBC: 4.62 MIL/uL (ref 3.87–5.11)
WBC: 14.1 10*3/uL — ABNORMAL HIGH (ref 4.0–10.5)

## 2012-01-12 NOTE — Patient Instructions (Signed)
Your procedure is scheduled on:01/13/12  Enter through the Main Entrance at :1130 am Pick up desk phone and dial [phone] and inform us of your arrival.  Please call [phone] if you have any problems the morning of surgery.  Remember: Do not eat or drink after midnight:tonight  Clear liquids ok until 9am Wed. Take these meds the morning of surgery with a sip of water:none  DO NOT wear jewelry, eye make-up, lipstick,body lotion, or dark fingernail polish. Do not shave for 48 hours prior to surgery.   Patients discharged on the day of surgery will not be allowed to drive home.

## 2012-01-13 ENCOUNTER — Encounter (HOSPITAL_COMMUNITY): Payer: Self-pay | Admitting: Anesthesiology

## 2012-01-13 ENCOUNTER — Encounter (HOSPITAL_COMMUNITY)
Admission: RE | Disposition: A | Discharge status: Home / Self Care | Payer: Self-pay | Source: Ambulatory Visit | Attending: Obstetrics & Gynecology

## 2012-01-13 ENCOUNTER — Ambulatory Visit (HOSPITAL_COMMUNITY): Payer: Medicaid Other | Admitting: Anesthesiology

## 2012-01-13 ENCOUNTER — Ambulatory Visit (HOSPITAL_COMMUNITY)
Admission: RE | Admit: 2012-01-13 | Discharge: 2012-01-13 | Disposition: A | Discharge status: Home / Self Care | Payer: Medicaid Other | Source: Ambulatory Visit | Attending: Obstetrics & Gynecology | Admitting: Obstetrics & Gynecology

## 2012-01-13 DIAGNOSIS — Z01812 Encounter for preprocedural laboratory examination: Secondary | ICD-10-CM | POA: Insufficient documentation

## 2012-01-13 DIAGNOSIS — E669 Obesity, unspecified: Secondary | ICD-10-CM

## 2012-01-13 DIAGNOSIS — R102 Pelvic and perineal pain: Secondary | ICD-10-CM

## 2012-01-13 DIAGNOSIS — Z30430 Encounter for insertion of intrauterine contraceptive device: Secondary | ICD-10-CM

## 2012-01-13 DIAGNOSIS — Z01818 Encounter for other preprocedural examination: Secondary | ICD-10-CM | POA: Insufficient documentation

## 2012-01-13 DIAGNOSIS — N938 Other specified abnormal uterine and vaginal bleeding: Secondary | ICD-10-CM | POA: Insufficient documentation

## 2012-01-13 DIAGNOSIS — N949 Unspecified condition associated with female genital organs and menstrual cycle: Secondary | ICD-10-CM | POA: Insufficient documentation

## 2012-01-13 HISTORY — PX: HYSTEROSCOPY WITH D & C: SHX1775

## 2012-01-13 LAB — PREGNANCY, URINE: Preg Test, Ur: NEGATIVE

## 2012-01-13 SURGERY — DILATATION AND CURETTAGE /HYSTEROSCOPY
Anesthesia: General | Site: Vagina | Wound class: Clean Contaminated

## 2012-01-13 MED ORDER — PROPOFOL 10 MG/ML IV EMUL
INTRAVENOUS | Status: AC
  Filled 2012-01-13: qty 20

## 2012-01-13 MED ORDER — DOCUSATE SODIUM 100 MG PO CAPS: 100.0000 mg | ORAL_CAPSULE | Freq: Two times a day (BID) | ORAL | Status: DC | PRN

## 2012-01-13 MED ORDER — KETOROLAC TROMETHAMINE 30 MG/ML IJ SOLN
INTRAMUSCULAR | Status: DC | PRN
  Administered 2012-01-13: 30 mg via INTRAVENOUS

## 2012-01-13 MED ORDER — ONDANSETRON HCL 4 MG/2ML IJ SOLN
INTRAMUSCULAR | Status: AC
  Filled 2012-01-13: qty 2

## 2012-01-13 MED ORDER — LIDOCAINE HCL (CARDIAC) 20 MG/ML IV SOLN
INTRAVENOUS | Status: DC | PRN
  Administered 2012-01-13: 50 mg via INTRAVENOUS

## 2012-01-13 MED ORDER — FENTANYL CITRATE 0.05 MG/ML IJ SOLN
INTRAMUSCULAR | Status: AC
  Filled 2012-01-13: qty 5

## 2012-01-13 MED ORDER — ONDANSETRON HCL 4 MG/2ML IJ SOLN
INTRAMUSCULAR | Status: DC | PRN
  Administered 2012-01-13: 4 mg via INTRAVENOUS

## 2012-01-13 MED ORDER — KETOROLAC TROMETHAMINE 30 MG/ML IJ SOLN
INTRAMUSCULAR | Status: AC
  Filled 2012-01-13: qty 1

## 2012-01-13 MED ORDER — LIDOCAINE HCL (CARDIAC) 20 MG/ML IV SOLN
INTRAVENOUS | Status: AC
  Filled 2012-01-13: qty 5

## 2012-01-13 MED ORDER — MIDAZOLAM HCL 2 MG/2ML IJ SOLN
INTRAMUSCULAR | Status: AC
  Filled 2012-01-13: qty 2

## 2012-01-13 MED ORDER — FENTANYL CITRATE 0.05 MG/ML IJ SOLN
INTRAMUSCULAR | Status: AC
  Administered 2012-01-13: 25 ug via INTRAVENOUS
  Filled 2012-01-13: qty 2

## 2012-01-13 MED ORDER — PROPOFOL 10 MG/ML IV EMUL
INTRAVENOUS | Status: DC | PRN
  Administered 2012-01-13: 300 mg via INTRAVENOUS

## 2012-01-13 MED ORDER — FENTANYL CITRATE 0.05 MG/ML IJ SOLN
25.0000 ug | INTRAMUSCULAR | Status: DC | PRN
  Administered 2012-01-13: 25 ug via INTRAVENOUS

## 2012-01-13 MED ORDER — BUPIVACAINE HCL 0.5 % IJ SOLN
INTRAMUSCULAR | Status: DC | PRN
  Administered 2012-01-13: 30 mL

## 2012-01-13 MED ORDER — FENTANYL CITRATE 0.05 MG/ML IJ SOLN
INTRAMUSCULAR | Status: DC | PRN
  Administered 2012-01-13 (×5): 50 ug via INTRAVENOUS
  Administered 2012-01-13: 100 ug via INTRAVENOUS
  Administered 2012-01-13: 50 ug via INTRAVENOUS

## 2012-01-13 MED ORDER — MEPERIDINE HCL 25 MG/ML IJ SOLN: 6.2500 mg | INTRAMUSCULAR | Status: DC | PRN

## 2012-01-13 MED ORDER — GLYCINE 1.5 % IR SOLN
Status: DC | PRN
  Administered 2012-01-13: 3000 mL

## 2012-01-13 MED ORDER — IBUPROFEN 800 MG PO TABS: 800.0000 mg | ORAL_TABLET | Freq: Three times a day (TID) | ORAL | Status: DC | PRN

## 2012-01-13 MED ORDER — OXYCODONE-ACETAMINOPHEN 5-325 MG PO TABS: 1.0000 | ORAL_TABLET | Freq: Four times a day (QID) | ORAL | Status: DC | PRN

## 2012-01-13 MED ORDER — MIDAZOLAM HCL 5 MG/5ML IJ SOLN
INTRAMUSCULAR | Status: DC | PRN
  Administered 2012-01-13: 1 mg via INTRAVENOUS

## 2012-01-13 MED ORDER — BUPIVACAINE HCL (PF) 0.5 % IJ SOLN
INTRAMUSCULAR | Status: AC
  Filled 2012-01-13: qty 30

## 2012-01-13 MED ORDER — METOCLOPRAMIDE HCL 5 MG/ML IJ SOLN: 10.0000 mg | Freq: Once | INTRAMUSCULAR | Status: DC | PRN

## 2012-01-13 MED ORDER — LACTATED RINGERS IV SOLN
INTRAVENOUS | Status: DC
  Administered 2012-01-13 (×2): via INTRAVENOUS

## 2012-01-13 MED ORDER — DEXAMETHASONE SODIUM PHOSPHATE 10 MG/ML IJ SOLN
INTRAMUSCULAR | Status: AC
  Filled 2012-01-13: qty 1

## 2012-01-13 SURGICAL SUPPLY — 16 items
CANISTER SUCTION 2500CC (MISCELLANEOUS) ×2 IMPLANT
CATH ROBINSON RED A/P 16FR (CATHETERS) ×2 IMPLANT
CLOTH BEACON ORANGE TIMEOUT ST (SAFETY) ×2 IMPLANT
CONTAINER PREFILL 10% NBF 60ML (FORM) ×2 IMPLANT
DRAPE PROXIMA HALF (DRAPES) ×2 IMPLANT
DRESSING TELFA 8X3 (GAUZE/BANDAGES/DRESSINGS) ×2 IMPLANT
ELECT LOOP GYNE PRO 24FR (CUTTING LOOP)
ELECTRODE LOOP GYNE PRO 24FR (CUTTING LOOP) IMPLANT
GLOVE BIO SURGEON STRL SZ7 (GLOVE) ×2 IMPLANT
GOWN STRL REIN XL XLG (GOWN DISPOSABLE) ×4 IMPLANT
LOOP ANGLED CUTTING 22FR (CUTTING LOOP) IMPLANT
Mirena IUD (Miscellaneous) ×2 IMPLANT
PACK HYSTEROSCOPY LF (CUSTOM PROCEDURE TRAY) ×2 IMPLANT
PAD OB MATERNITY 4.3X12.25 (PERSONAL CARE ITEMS) ×2 IMPLANT
TOWEL OR 17X24 6PK STRL BLUE (TOWEL DISPOSABLE) ×4 IMPLANT
WATER STERILE IRR 1000ML POUR (IV SOLUTION) ×2 IMPLANT

## 2012-01-13 NOTE — Transfer of Care (Signed)
Immediate Anesthesia Transfer of Care Note  Patient: Dawn Galvan  Procedure(s) Performed: Procedure(s) (LRB) with comments: DILATATION AND CURETTAGE /HYSTEROSCOPY (N/A) - Pap smear  Patient Location: PACU  Anesthesia Type:General  Level of Consciousness: awake, alert  and oriented  Airway & Oxygen Therapy: Patient Spontanous Breathing and Patient connected to nasal cannula oxygen  Post-op Assessment: Report given to PACU RN and Post -op Vital signs reviewed and stable  Post vital signs: Reviewed and stable  Complications: No apparent anesthesia complications

## 2012-01-13 NOTE — H&P (View-Only) (Signed)
History:  35 y.o. G2P2002 here today for evaluation of ongoing DUB. She has been on Depo Provera for a few years.   Also reports malodorous vaginal discharge.  The following portions of the patient's history were reviewed and updated as appropriate: allergies, current medications, past family history, past medical history, past social history, past surgical history and problem list.  Review of Systems:  Pertinent items are noted in HPI.  Objective:  Physical Exam Blood pressure 140/87, pulse 103, temperature 97.5 F (36.4 C), temperature source Oral, height 5' 5" (1.651 m), weight 363 lb (164.656 kg), last menstrual period 11/24/2011. Gen: NAD Abd: Soft, nontender and nondistended, morbidly obese habitus Pelvic: Normal appearing external genitalia; normal appearing vaginal mucosa.  Long vagina, used long speculum. Cervix tilted posteriorly.  Brown discharge seen.  Sample taken for wet prep.   ENDOMETRIAL BIOPSY     The indications for endometrial biopsy were reviewed.   Risks of the biopsy including cramping, bleeding, infection, uterine perforation, inadequate specimen and need for additional procedures  were discussed. The patient states she understands and agrees to undergo procedure today. Consent was signed. Time out was performed. Urine HCG was negative. A single-toothed tenaculum was placed on the anterior lip of the cervix to stabilize it. The 3 mm pipelle was attempted to be introduced into the endometrial cavity, unable to pass through internal os easily.  Patient did not tolerate procedure attempt well, asked me to stop the biopsy.   The instruments were removed from the patient's vagina. Minimal bleeding from the cervix was noted.   Assessment & Plan:  Will attempt endometrial biopsy in the OR under anesthesia, will do hysteroscopy, D&C in the OR. Depo Provera 300mg IM given; the 150 mg dose may have been insufficient Follow up wet prep and manage accordingly Patient will be  contacted by surgical scheduler about her hysteroscopy, D&C   

## 2012-01-13 NOTE — Anesthesia Postprocedure Evaluation (Signed)
Anesthesia Post Note  Patient: Dawn Galvan  Procedure(s) Performed: Procedure(s) (LRB): DILATATION AND CURETTAGE /HYSTEROSCOPY (N/A)  Anesthesia type: General  Patient location: PACU  Post pain: Pain level controlled  Post assessment: Post-op Vital signs reviewed  Last Vitals:  Filed Vitals:   01/13/12 1400  BP:   Pulse: 95  Temp: 37.2 C  Resp:     Post vital signs: Reviewed  Level of consciousness: sedated  Complications: No apparent anesthesia complicationsfj

## 2012-01-13 NOTE — Interval H&P Note (Signed)
History and Physical Interval Note:  01/13/2012 12:21 PM  Dawn Galvan  has presented today for surgery, with the diagnosis of DUB  The various methods of treatment have been discussed with the patient and family. After consideration of risks, benefits and other options for treatment, the patient has consented to Procedure(s) (LRB) with comments:DILATATION AND CURETTAGE /HYSTEROSCOPY (N/A), pap smear as a surgical intervention.  Also added on Mirena IUD insertion after detailed counseling.   The patient's history has been reviewed, patient examined, no change in status, stable for surgery.  I have reviewed the patient's chart and labs.  Ultrasound report is below.  01/12/2012  *RADIOLOGY REPORT*  Clinical Data: Dysfunctional uterine bleeding.  Morbid obesity.  On Depo-Provera.  TRANSABDOMINAL AND TRANSVAGINAL ULTRASOUND OF PELVIS  Technique:  Both transabdominal and transvaginal ultrasound examinations of the pelvis were performed.  Transabdominal technique was performed for global imaging of the pelvis including uterus, ovaries, adnexal regions, and pelvic cul-de-sac.  It was necessary to proceed with endovaginal exam following the transabdominal exam to visualize the endometrium and ovaries.  Comparison:  07/24/2010  Findings: Uterus:  8.3 x 4.5 x 5.0 cm.  No fibroids or other uterine mass identified.  Endometrium: Double layer thickness measures 9 mm.  No focal lesion visualized.  Right ovary: 1.5 x 1.2 x 1.8 cm.  Normal appearance.  Left ovary: not directly visualized by transabdominal or transvaginal sonography, however no adnexal mass identified.  Other Findings:  No free fluid  IMPRESSION:  1.  Normal appearance of uterus and right ovary. 2.  Nonvisualization of left ovary, however no adnexal mass or other significant abnormality identified. 3.  Endometrial thickness measures 9 mm.  If bleeding remains unresponsive to hormonal or medical therapy, sonohysterogram should be considered for focal lesion  work-up. (Ref:  Radiological Reasoning: Algorithmic Workup of Abnormal Vaginal Bleeding with Endovaginal Sonography and Sonohysterography. AJR 2008; 161:W96-04)   Original Report Authenticated By: Myles Rosenthal, M.D.    01/12/2012  TRANSABDOMINAL AND TRANSVAGINAL ULTRASOUND OF PELVIS Clinical Data: Dysfunctional uterine bleeding.  Morbid obesity.  On Depo-Provera.  S  Technique:  Both transabdominal and transvaginal ultrasound examinations of the pelvis were performed.  Transabdominal technique was performed for global imaging of the pelvis including uterus, ovaries, adnexal regions, and pelvic cul-de-sac.  It was necessary to proceed with endovaginal exam following the transabdominal exam to visualize the endometrium and ovaries.  Comparison:  07/24/2010  Findings: Uterus:  8.3 x 4.5 x 5.0 cm.  No fibroids or other uterine mass identified.  Endometrium: Double layer thickness measures 9 mm.  No focal lesion visualized.  Right ovary: 1.5 x 1.2 x 1.8 cm.  Normal appearance.  Left ovary: not directly visualized by transabdominal or transvaginal sonography, however no adnexal mass identified.  Other Findings:  No free fluid  IMPRESSION:  1.  Normal appearance of uterus and right ovary. 2.  Nonvisualization of left ovary, however no adnexal mass or other significant abnormality identified. 3.  Endometrial thickness measures 9 mm.  If bleeding remains unresponsive to hormonal or medical therapy, sonohysterogram should be considered for focal lesion work-up. (Ref:  Radiological Reasoning: Algorithmic Workup of Abnormal Vaginal Bleeding with Endovaginal Sonography and Sonohysterography. AJR 2008; 540:J81-19)   Original Report Authenticated By: Myles Rosenthal, M.D.    Questions were answered to the patient's satisfaction.  To OR when ready.   Jaynie Collins, MD, FACOG Attending Obstetrician & Gynecologist Faculty Practice, [facility] [place]

## 2012-01-13 NOTE — Anesthesia Preprocedure Evaluation (Addendum)
Anesthesia Evaluation  Patient identified by MRN, date of birth, ID band Patient awake    Reviewed: Allergy & Precautions, H&P , NPO status , Patient's Chart, lab work & pertinent test results  Airway Mallampati: III TM Distance: >3 FB Neck ROM: Full    Dental No notable dental hx. (+) Teeth Intact   Pulmonary shortness of breath and with exertion, sleep apnea ,  Probable undiagnosed OSA breath sounds clear to auscultation  Pulmonary exam normal       Cardiovascular negative cardio ROS  Rhythm:Regular Rate:Normal     Neuro/Psych  Headaches, Anxiety    GI/Hepatic negative GI ROS, Neg liver ROS,   Endo/Other  Morbid obesity  Renal/GU negative Renal ROS  negative genitourinary   Musculoskeletal negative musculoskeletal ROS (+)   Abdominal Normal abdominal exam  (+) + obese,   Peds  Hematology negative hematology ROS (+)   Anesthesia Other Findings   Reproductive/Obstetrics negative OB ROS DUB                           Anesthesia Physical Anesthesia Plan  ASA: III  Anesthesia Plan: General   Post-op Pain Management:    Induction: Intravenous  Airway Management Planned: Natural Airway and LMA  Additional Equipment:   Intra-op Plan:   Post-operative Plan: Extubation in OR  Informed Consent: I have reviewed the patients History and Physical, chart, labs and discussed the procedure including the risks, benefits and alternatives for the proposed anesthesia with the patient or authorized representative who has indicated his/her understanding and acceptance.   Dental advisory given  Plan Discussed with: CRNA, Anesthesiologist and Surgeon  Anesthesia Plan Comments:        Anesthesia Quick Evaluation

## 2012-01-13 NOTE — Op Note (Signed)
PREOPERATIVE DIAGNOSIS:  Dysfunctional uterine bleeding, morbid obesity, inability to do in-office examination. POSTOPERATIVE DIAGNOSIS: The same PROCEDURE: Hysteroscopy, Dilation and Curettage, Mirena IUD placement, Pap Smear. SURGEON:  Dr. Jaynie Collins   INDICATIONS: 36 y.o. [id number]  here for scheduled surgery for evaluation of DUB.   Risks of surgery were discussed with the patient including but not limited to: bleeding ; infection; injury to uterus or surrounding organs; intrauterine scarring which may impair future fertility; need for additional procedures including laparotomy or laparoscopy; and other postoperative/anesthesia complications. Written informed consent was obtained.    FINDINGS:  8 week size uterus.  Diffuse proliferative endometrium with polypoid lesions noted next to right ostium.  Normal ostia bilaterally.  ANESTHESIA:   General, paracervical block. INTRAVENOUS FLUIDS: 900 ml of LR FLUID DEFICITS:  110 ml of Glycine ESTIMATED BLOOD LOSS:  50 ml SPECIMENS: Endometrial curettings sent to pathology COMPLICATIONS:  None immediate.  PROCEDURE DETAILS:  The patient was then taken to the operating room where general anesthesia was administered and was found to be adequate.  After an adequate timeout was performed, she was placed in the dorsal lithotomy position and examined; speculum was placed in the vagina and a pap smear was obtained with some difficulty secondary to habitus. She was then prepped and draped in the sterile manner.   Her bladder was catheterized for a small amount of clear, yellow urine. A speculum was then placed in the patient's vagina and a single tooth tenaculum was applied to the anterior lip of the cervix.   A paracervical block using 30 ml of 0.5% Marcaine was administered.  The cervix was sounded to 8 cm and dilated manually with metal dilators to accommodate the 5 mm diagnostic hysteroscope.  Once the cervix was dilated, the hysteroscope was inserted under  direct visualization using glycine as a suspension medium.  The uterine cavity was carefully examined, both ostia were recognized, and the findings above were noted.   After further careful visualization of the uterine cavity, the hysteroscope was removed under direct visualization.  A sharp curettage was then performed to obtain a moderate amount of endometrial curettings; also used the polyp forceps to remove the polypoid lesions.  Mirena IUD was then placed per the manufacturer's recommendations.  Strings trimmed to 3 cm.   The tenaculum was removed from the anterior lip of the cervix and the vaginal speculum was removed after noting good hemostasis.  The patient tolerated the procedure well and was taken to the recovery area awake, extubated and in stable condition.  The patient will be discharged to home as per PACU criteria.  Routine postoperative instructions given.  She was prescribed Percocet, Ibuprofen and Colace.  She will follow up in the clinic on 02/01/12  for postoperative evaluation.

## 2012-01-14 ENCOUNTER — Telehealth: Payer: Self-pay | Admitting: *Deleted

## 2012-01-14 ENCOUNTER — Encounter (HOSPITAL_COMMUNITY): Payer: Self-pay | Admitting: Obstetrics & Gynecology

## 2012-01-14 NOTE — Telephone Encounter (Signed)
Called pt and informed her of results as requested by Dr. Macon Large. Pt instructed to keep clinic appt as scheduled on 02/01/12. She voiced understanding.

## 2012-01-14 NOTE — Telephone Encounter (Signed)
Message copied by Jill Side on Thu Jan 14, 2012 12:05 PM ------      Message from: Jaynie Collins A      Created: Thu Jan 14, 2012 11:25 AM       Benign pathology, just had some polyps.  Please call to inform patient of results.

## 2012-01-25 ENCOUNTER — Telehealth: Payer: Self-pay | Admitting: General Practice

## 2012-01-25 NOTE — Telephone Encounter (Signed)
Patient called and stated that Dr Macon Large did her surgery the week before last and has been having a lot of cramping since then and has been taking a lot of pain medicine percocet, tylenol, and ibuprofen and nothing is working and she is urinating a whole lot and thinks it's the IUD causing all these problems and pain and needs an appt as soon as possible to get this IUD taken out immediately and would like a call back. Called patient back and told her I received her message and instructed the patient to please stop taking tylenol & percocet at the same time because that's very damaging to your liver. Patient stated she could not help it she knows it's bad for her but the pain is absolutely unbearable and that she needs this IUD out and that she needed to be seen sooner than Monday and that she could not wait till then nor did she have the time or someone to watch her kids so that she could go to the emergency room. Was able to get patient a sooner appt for 12/4 at 3:45, patient stated she did not know if she could make that appt because there was no one to watch her kids and get them off the bus and she does not live here in [place]. Patient states she is going to call her mother and talk to her about what arrangements can be made and will call us back.

## 2012-01-27 ENCOUNTER — Ambulatory Visit: Payer: Medicaid Other | Admitting: Obstetrics & Gynecology

## 2012-02-01 ENCOUNTER — Ambulatory Visit: Payer: Medicaid Other | Admitting: Obstetrics & Gynecology

## 2012-02-10 ENCOUNTER — Telehealth: Payer: Self-pay

## 2012-02-10 NOTE — Telephone Encounter (Signed)
Pt called and stated that she is still having an odor and that she was recommended refresh otc that is too expensive can something be prescribed for me to take.  Called pt and pt informed me what is stated above.  I informed that there is nothing that can be prescribed for the odor, but we do have samples of the Represh that she can come pick up.  Pt stated I will not be able to come to [place] till next week.  I informed her that the sample will be here for her.  Pt stated "ok" and had no further questions.

## 2012-02-15 ENCOUNTER — Ambulatory Visit: Payer: Medicaid Other | Admitting: Obstetrics & Gynecology

## 2012-03-03 ENCOUNTER — Telehealth: Payer: Self-pay

## 2012-03-03 NOTE — Telephone Encounter (Signed)
Pt called and stated that she is still having pain and bleeding with urinary frequency since her surgery and she can not get transportation.    Called pt and pt informed me that same thing as stated above.  I asked pt if she had a post op appt.  Pt stated that she could not make it to the post op appt due to not having transportation.  I informed pt that it is highly encouraged that you come in for her post op cause that is the time for when issues such as she is having can be evaluated and addressed.  Pt stated that she would have to call back to set up an appt. so that she can find transportation.  I advised pt that if pain becomes intense to please go to [facility].  Pt stated understanding and did not have any further questions.

## 2012-03-04 ENCOUNTER — Ambulatory Visit: Payer: Medicaid Other | Admitting: Obstetrics & Gynecology

## 2012-03-17 ENCOUNTER — Ambulatory Visit: Payer: Medicaid Other

## 2012-03-18 ENCOUNTER — Encounter: Payer: Medicaid Other | Admitting: Obstetrics & Gynecology

## 2012-03-25 ENCOUNTER — Telehealth: Payer: Self-pay

## 2012-03-25 NOTE — Telephone Encounter (Signed)
Pt called and stated that she was refused the Depo Provera last week and now she is having bleeding.  Called pt and pt informed the above.  Pt was yelling on another phone call while I was on hold.  Pt hung up phone.  Returned pt call and pt was still yelling on another phone and she asked me to hold.  Pt returns to phone yelling and saying that she has an IUD and that she was told that she did not need the Depo Provera.  Pt keeps yelling and I can not understand pt.  I stated to pt that I can not understand her if she continues to yell at me.  Pt got upset and hung the phone.

## 2012-03-28 NOTE — Progress Notes (Signed)
This encounter was created in error - please disregard.

## 2012-04-07 ENCOUNTER — Ambulatory Visit: Payer: Medicaid Other | Admitting: Obstetrics & Gynecology

## 2012-04-20 ENCOUNTER — Encounter: Payer: Self-pay | Admitting: Obstetrics & Gynecology

## 2012-05-05 ENCOUNTER — Encounter: Payer: Self-pay | Admitting: Obstetrics & Gynecology

## 2012-05-05 ENCOUNTER — Ambulatory Visit (INDEPENDENT_AMBULATORY_CARE_PROVIDER_SITE_OTHER): Payer: Medicaid Other | Admitting: Obstetrics & Gynecology

## 2012-05-05 ENCOUNTER — Other Ambulatory Visit: Payer: Self-pay | Admitting: Obstetrics & Gynecology

## 2012-05-05 VITALS — BP 143/97 | HR 87 | Temp 98.7°F | Ht 65.0 in | Wt 361.2 lb

## 2012-05-05 DIAGNOSIS — N898 Other specified noninflammatory disorders of vagina: Secondary | ICD-10-CM

## 2012-05-05 DIAGNOSIS — N760 Acute vaginitis: Secondary | ICD-10-CM

## 2012-05-05 DIAGNOSIS — B9689 Other specified bacterial agents as the cause of diseases classified elsewhere: Secondary | ICD-10-CM

## 2012-05-05 DIAGNOSIS — Z9889 Other specified postprocedural states: Secondary | ICD-10-CM

## 2012-05-05 DIAGNOSIS — Z09 Encounter for follow-up examination after completed treatment for conditions other than malignant neoplasm: Secondary | ICD-10-CM

## 2012-05-05 NOTE — Patient Instructions (Signed)
Return to clinic for any scheduled appointments or for any gynecologic concerns as needed.   

## 2012-05-05 NOTE — Progress Notes (Signed)
History:  37 y.o. [id number] here today for follow up after pap smear, hysteroscopy, D&C, Mirena insertion on 01/13/12. Did not come for her postop appointment secondary to transportation issues. Reports vaginal discharge and odor. No bleeding since 02/2012, she is satisfied with the Mirena.  The following portions of the patient's history were reviewed and updated as appropriate: allergies, current medications, past family history, past medical history, past social history, past surgical history and problem list.  Review of Systems:  Pertinent items are noted in HPI.  Objective:  Physical Exam BP 143/97  Pulse 87  Temp(Src) 98.7 F (37.1 C) (Oral)  Ht 5\' 5"  (1.651 m)  Wt 361 lb 3.2 oz (163.839 kg)  BMI 60.11 kg/m2 Gen: NAD Abd: Soft, obese, nontender and nondistended Pelvic: Normal external genitalia. White discharge seen, sample taken for wet prep.  Labs and Imaging 01/13/12 Endometrium, curettage - POLYPOID PROLIFERATIVE PHASE ENDOMETRIUM WITH PROGESTERONE INDUCED STROMAL CHANGE. - NEGATIVE FOR HYPERPLASIA OR MALIGNANCY. - BENIGN ENDOCERVICAL MUCOSA. - FRAGMENTS OF BENIGN SMOOTH MUSCLE. - DETACHED FRAGMENTS OF SQUAMOUS EPITHELIUM; NEGATIVE FOR INTRAEPITHELIAL LESION OR MALIGNANCY. Negative pap smear, HPV negative   Assessment & Plan:  Normal postoperative exam Will follow up results of wet prep and manage accordingly

## 2012-05-07 LAB — WET PREP BY MOLECULAR PROBE
Candida species: NEGATIVE
Trichomonas vaginosis: NEGATIVE

## 2012-05-09 ENCOUNTER — Telehealth: Payer: Self-pay | Admitting: Obstetrics and Gynecology

## 2012-05-09 MED ORDER — TINIDAZOLE 500 MG PO TABS: 2.0000 g | ORAL_TABLET | Freq: Every day | ORAL | Status: DC

## 2012-05-09 NOTE — Addendum Note (Signed)
Addended by: Jaynie Collins A on: 05/09/2012 01:58 PM   Modules accepted: Orders

## 2012-05-09 NOTE — Telephone Encounter (Addendum)
Message copied by Toula Moos on Mon May 09, 2012  3:20 PM ------      Message from: Jaynie Collins A      Created: Mon May 09, 2012  1:59 PM       BV on wet prep. Tinidazole eprescribed.  Please call to inform patient of results and tell her to pick up prescription.       ------called patient; no answer. Left message to call clinic back for important information.

## 2012-05-10 ENCOUNTER — Telehealth: Payer: Self-pay

## 2012-05-10 NOTE — Telephone Encounter (Addendum)
Patient returned a nurse call and left an alternate number to be contacted at.  [phone] I tried to reach patient at the number given to [place] on the nurse line and was informed that I had reached the wrong number.  I confirmed the number with the person I had on the line and was informed Dawn Galvan could not be reached at that number.  Patient has BV on wet prep.  Tinidazole was eprescribed for her.  Please call patient to inform her of the results and that her medication is ready for pick up.

## 2012-05-12 NOTE — Telephone Encounter (Signed)
Attempted to contact patient but a message stated this person is unavailable right now- couldn't leave message

## 2012-05-16 ENCOUNTER — Encounter: Payer: Self-pay | Admitting: *Deleted

## 2012-05-16 NOTE — Telephone Encounter (Signed)
Called patient on both numbers listed. Both had message that patient voicemail box was full. Will send certified letter with results.

## 2012-12-28 ENCOUNTER — Telehealth: Payer: Self-pay | Admitting: *Deleted

## 2012-12-28 NOTE — Telephone Encounter (Signed)
Dawn Galvan called and left a message stating she spoke with a nurse earlier this morning.  States the nurse told her to go to urgent care or community wellness because we did not have any appointments to see a provider.  States she was trying to tell her that she has medicaid and she can't be seen in urgent care unless her primary care doctor ( whom she has never seen) approves it.  States she called urgent care and they said she couldn't be seen with medicaid. She states she hasn't gotten an answer at [facility] and wellness.  States she is having irritaiton and lots of urination.  Per chart review no telephone notes viewed, but patient does have an appointment to give urine specimen in the am. Discussed with another nurse and she did tell patient to come in to provide urine to check for uti. Also did tell her to go to urgent care and they should take medicaid or call others of her choice.  Called [name] and left a message we are returning your call, please keep your appointment to give specimen tomorrow.

## 2012-12-29 ENCOUNTER — Ambulatory Visit (INDEPENDENT_AMBULATORY_CARE_PROVIDER_SITE_OTHER): Payer: Medicaid Other | Admitting: General Practice

## 2012-12-29 DIAGNOSIS — R350 Frequency of micturition: Secondary | ICD-10-CM

## 2012-12-29 LAB — POCT URINALYSIS DIP (DEVICE)
Bilirubin Urine: NEGATIVE
Glucose, UA: NEGATIVE mg/dL
Ketones, ur: NEGATIVE mg/dL
Leukocytes, UA: NEGATIVE
Nitrite: NEGATIVE
Protein, ur: NEGATIVE mg/dL
Specific Gravity, Urine: 1.025 (ref 1.005–1.030)
Urobilinogen, UA: 0.2 mg/dL (ref 0.0–1.0)
pH: 6 (ref 5.0–8.0)

## 2012-12-29 NOTE — Telephone Encounter (Signed)
Patient came by today and dropped off urine. Urine sent for culture and patient knows we will call her if anything comes back.

## 2013-01-03 ENCOUNTER — Telehealth: Payer: Self-pay | Admitting: *Deleted

## 2013-01-03 DIAGNOSIS — G8929 Other chronic pain: Secondary | ICD-10-CM

## 2013-01-03 LAB — URINE CULTURE

## 2013-01-03 MED ORDER — IBUPROFEN 800 MG PO TABS: 800.0000 mg | ORAL_TABLET | Freq: Three times a day (TID) | ORAL | Status: DC | PRN

## 2013-01-03 NOTE — Telephone Encounter (Signed)
Pt called nurse line requesting advice for what to do for pain in back (sharp) with cramps, bleeding (clots) since Friday.  Pt does have mirena IUD and is taking tylenol.

## 2013-01-03 NOTE — Telephone Encounter (Signed)
Called pt and discussed her sx. She states that she has had very little problem or pain since receiving the Mirena IUD 1 year ago. Now she is having a very heavy episode of bleeding with back pain and abdominal cramps. Tylenol is not working. I advised pt that she will most likely have better pain relief from ibuprofen. Pt voiced understanding and requested Rx to be sent to her pharmacy. Rx order received from Wynelle Bourgeois CNM and sent to pharmacy.

## 2013-01-04 ENCOUNTER — Telehealth: Payer: Self-pay | Admitting: *Deleted

## 2013-01-04 ENCOUNTER — Other Ambulatory Visit: Payer: Self-pay | Admitting: Obstetrics and Gynecology

## 2013-01-04 MED ORDER — CEPHALEXIN 500 MG PO CAPS: 500.0000 mg | ORAL_CAPSULE | Freq: Four times a day (QID) | ORAL | Status: DC

## 2013-01-04 NOTE — Telephone Encounter (Addendum)
Message copied by Jill Side on Wed Jan 04, 2013  9:30 AM ------      Message from: CONSTANT, PEGGY      Created: Wed Jan 04, 2013  8:44 AM       Please inform patient of positive UTI and Rx e-prescribed            Thanks            Peggy  ------  Called pt and informed her of +UTI and need for treatment. Pt voiced understanding and will obtain medication today.

## 2013-02-20 ENCOUNTER — Ambulatory Visit (INDEPENDENT_AMBULATORY_CARE_PROVIDER_SITE_OTHER): Payer: Medicaid Other | Admitting: Obstetrics & Gynecology

## 2013-02-20 ENCOUNTER — Telehealth: Payer: Self-pay | Admitting: *Deleted

## 2013-02-20 VITALS — BP 135/88 | HR 92 | Ht 65.0 in | Wt 375.8 lb

## 2013-02-20 DIAGNOSIS — Z309 Encounter for contraceptive management, unspecified: Secondary | ICD-10-CM

## 2013-02-20 DIAGNOSIS — N949 Unspecified condition associated with female genital organs and menstrual cycle: Secondary | ICD-10-CM

## 2013-02-20 DIAGNOSIS — Z30432 Encounter for removal of intrauterine contraceptive device: Secondary | ICD-10-CM

## 2013-02-20 DIAGNOSIS — N938 Other specified abnormal uterine and vaginal bleeding: Secondary | ICD-10-CM

## 2013-02-20 DIAGNOSIS — Z30017 Encounter for initial prescription of implantable subdermal contraceptive: Secondary | ICD-10-CM

## 2013-02-20 DIAGNOSIS — N925 Other specified irregular menstruation: Secondary | ICD-10-CM

## 2013-02-20 MED ORDER — ETONOGESTREL 68 MG ~~LOC~~ IMPL
68.0000 mg | DRUG_IMPLANT | Freq: Once | SUBCUTANEOUS | Status: AC
  Administered 2013-02-20: 68 mg via SUBCUTANEOUS

## 2013-02-20 NOTE — Telephone Encounter (Signed)
Pt called nurse line demanding an appointment. States her IUD is embedded and confirmed by CAT scan.  Tresa Endo gave appointment for 02/20/2013 at 1445.    Spoke with patient.  CAT scan from [facility].  Pt states she will be here for appt at 1445.

## 2013-02-20 NOTE — Progress Notes (Signed)
Patient ID: Dawn Galvan, female   DOB: [birth year], 37 y.o.   MRN: [id number]  Chief Complaint  Patient presents with  . IUD Problem    HPI Dawn Galvan is a 37 y.o. female.  [id number] No LMP recorded. Patient is not currently having periods (Reason: IUD). Pelvic and low back pain with CT showing possible iud embedded in myometrium. She requests removal and wants Nexplanon. The method and the risk of insertion problem, difficult removal, irregular bleeding and possible less effective than IUD for contraception.  Risk that pelvic pain may not be relieved.   HPI  Past Medical History  Diagnosis Date  . Abnormal Pap smear of cervix years ago    have been normal since  . Obesity   . Anxiety   . Shortness of breath     on exertion  . Headache     Past Surgical History  Procedure Laterality Date  . Wisdom tooth extraction  11/20/10    all four extracted  . Toe surgery      broken toe  . Cesarean section    . Cholecystectomy    . Hernia repair    . Hernia repair    . Hysteroplasty repair of uterine anomaly    . Hysteroscopy w/d&c  01/13/2012    Procedure: DILATATION AND CURETTAGE /HYSTEROSCOPY;  Surgeon: Tereso Newcomer, MD;  Location: [facility];  Service: Gynecology;  Laterality: N/A;  Pap smear    Family History  Problem Relation Age of Onset  . Hypertension Mother   . Hypertension Father   . Diabetes Maternal Grandmother   . Diabetes Paternal Grandmother     Social History History  Substance Use Topics  . Smoking status: Former Games developer  . Smokeless tobacco: Never Used  . Alcohol Use: No    No Known Allergies  Current Outpatient Prescriptions  Medication Sig Dispense Refill  . ibuprofen (ADVIL,MOTRIN) 800 MG tablet Take 1 tablet (800 mg total) by mouth every 8 (eight) hours as needed.  30 tablet  0  . acetaminophen (TYLENOL) 500 MG tablet Take 500 mg by mouth every 6 (six) hours as needed for pain.      . cephALEXin (KEFLEX) 500 MG capsule Take 1 capsule (500 mg  total) by mouth 4 (four) times daily.  28 capsule  0  . diphenhydrAMINE (BENADRYL) 25 mg capsule Take 25 mg by mouth every 6 (six) hours as needed.      . docusate sodium (COLACE) 100 MG capsule Take 1 capsule (100 mg total) by mouth 2 (two) times daily as needed for constipation.  30 capsule  2  . fluconazole (DIFLUCAN) 150 MG tablet Take 1 tablet (150 mg total) by mouth once.  1 tablet  0  . oxyCODONE-acetaminophen (PERCOCET/ROXICET) 5-325 MG per tablet Take 1-2 tablets by mouth every 6 (six) hours as needed for pain.  30 tablet  0  . tinidazole (TINDAMAX) 500 MG tablet Take 4 tablets (2,000 mg total) by mouth daily with breakfast. For two days  8 tablet  2  . tinidazole (TINDAMAX) 500 MG tablet Take 4 tablets (2,000 mg total) by mouth daily with breakfast. For two days  8 tablet  2   No current facility-administered medications for this visit.    Review of Systems Review of Systems  Constitutional: Negative for fever.  Genitourinary: Positive for pelvic pain. Negative for frequency and flank pain.  Musculoskeletal: Positive for back pain.    Blood pressure 135/88, pulse 92, height 5\' 5"  (  1.651 m), weight 375 lb 12.8 oz (170.462 kg).  Physical Exam Physical Exam  Nursing note and vitals reviewed. Constitutional: She is oriented to person, place, and time. No distress.  Morbid obesity  Genitourinary: Vagina normal and uterus normal. No vaginal discharge found.  iud string seen, grasped and remove intact  Neurological: She is alert and oriented to person, place, and time.  Skin: Skin is warm and dry.  Psychiatric: She has a normal mood and affect. Her behavior is normal.  Patient given informed consent, signed copy in the chart, time out was performed. Pregnancy test was neg. Appropriate time out taken.  Patient's left arm was prepped and draped in the usual sterile fashion.. The ruler used to measure and mark insertion area.  Pt was prepped with alcohol swab and then injected with 3  cc of 1% lidocaine with epinephrine.  Pt was prepped with betadine, Nexplanon removed form packaging,  Device confirmed in needle, then inserted full length of needle and withdrawn per handbook instructions.  Pt insertion site covered with guaze.   Minimal blood loss.  Pt tolerated the procedure well.   Data Reviewed CT scan, notes  Assessment    H/O menstrual problems IUD removed   Nexplanon placed  Plan    F/u 3  months        ARNOLD,JAMES 02/20/2013, 4:33 PM

## 2013-02-20 NOTE — Progress Notes (Signed)
Had cat scan to r/o kidney stones. Showed that iud was imbedded.

## 2013-02-27 ENCOUNTER — Encounter: Payer: Self-pay | Admitting: *Deleted

## 2013-02-27 ENCOUNTER — Telehealth: Payer: Self-pay | Admitting: *Deleted

## 2013-02-27 NOTE — Telephone Encounter (Signed)
Pt called nurse line requesting information regarding nexplanon and overweight patient.  Questions the effectiveness of the medication in prevention of pregnancy.  If it is lower than 85% effective, desires additional medication.

## 2013-02-28 NOTE — Telephone Encounter (Signed)
Called patient stating I was returning her phone call. Patient voiced concern over her bleeding history and that she's been bleeding since she got the nexplanon put it and  Is worried about the nexplanon not working or helping. Discussed concerns with patient and advised her to call us back if she still wasn't feeling well in the next few weeks and to not wait to call us until she is very sick. Told patient I would send a note to our front office staff to make her an appt for April to follow up on her nexplanon and at that time her and the provider can discuss if the nexplanon is working for her or not. Patient was satisfied, verbalized understanding and had no further questions

## 2013-03-01 ENCOUNTER — Encounter: Payer: Self-pay | Admitting: *Deleted

## 2013-05-01 ENCOUNTER — Telehealth: Payer: Self-pay | Admitting: *Deleted

## 2013-05-01 NOTE — Telephone Encounter (Signed)
Pt left message stating that she has the Nexplanon for birth control. She is having light spotting and wants to know if this is normal. Also, she is waiting to hear about an appt for April.  I returned pt's call and explained that she may have irregular bleeding for the first 3-6 months after Nexplanon insertion. Some women continue to have periods but most times they are very light and not every month. She should call us if she is having severe or heavy bleeding.  Pt should call the appt line to set up her follow up appt with Dr. Debroah LoopArnold in April. Pt voiced understanding.

## 2013-05-04 ENCOUNTER — Ambulatory Visit: Payer: Medicaid Other | Admitting: Obstetrics & Gynecology

## 2013-06-09 ENCOUNTER — Ambulatory Visit: Payer: Medicaid Other | Admitting: Obstetrics & Gynecology

## 2013-07-03 ENCOUNTER — Telehealth: Payer: Self-pay | Admitting: *Deleted

## 2013-07-03 ENCOUNTER — Ambulatory Visit: Payer: Medicaid Other | Admitting: Obstetrics & Gynecology

## 2013-07-03 NOTE — Telephone Encounter (Signed)
Called Dawn HarmanDana and left a message she missed a scheduled appointment- please call back to reschedule if you are having any issues.  ( per chart was follow up for nexaplanon insertion/ bleeding issues)

## 2013-07-10 ENCOUNTER — Other Ambulatory Visit: Payer: Self-pay | Admitting: Orthopedic Surgery

## 2013-07-24 DIAGNOSIS — G5602 Carpal tunnel syndrome, left upper limb: Secondary | ICD-10-CM

## 2013-07-24 HISTORY — DX: Carpal tunnel syndrome, left upper limb: G56.02

## 2013-08-10 ENCOUNTER — Encounter (HOSPITAL_BASED_OUTPATIENT_CLINIC_OR_DEPARTMENT_OTHER): Payer: Self-pay | Admitting: *Deleted

## 2013-08-10 DIAGNOSIS — R21 Rash and other nonspecific skin eruption: Secondary | ICD-10-CM

## 2013-08-10 HISTORY — DX: Rash and other nonspecific skin eruption: R21

## 2013-08-10 NOTE — Pre-Procedure Instructions (Signed)
BMI reviewed with Dr. Ivin Bootyrews; pt. is at great risk of complications due to obesity; needs to be done at [facility].  Sarah at Dr. Merrilee SeashoreKuzma's office notified.

## 2013-08-10 NOTE — Progress Notes (Signed)
08/10/13 1414  OBSTRUCTIVE SLEEP APNEA  Have you ever been diagnosed with sleep apnea through a sleep study? No  Do you snore loudly (loud enough to be heard through closed doors)?  1  Do you often feel tired, fatigued, or sleepy during the daytime? 1  Has anyone observed you stop breathing during your sleep? 1  Do you have, or are you being treated for high blood pressure? 0  BMI more than 35 kg/m2? 1  Age over 874 years old? 0  Gender: 0  Obstructive Sleep Apnea Score 4  Score 4 or greater  Results sent to PCP (Tanna FurryBrittany Hout, PA-C)

## 2013-08-16 ENCOUNTER — Encounter (HOSPITAL_COMMUNITY): Payer: Self-pay | Admitting: Pharmacy Technician

## 2013-08-17 ENCOUNTER — Encounter (HOSPITAL_COMMUNITY): Payer: Self-pay | Admitting: *Deleted

## 2013-08-17 ENCOUNTER — Ambulatory Visit (HOSPITAL_BASED_OUTPATIENT_CLINIC_OR_DEPARTMENT_OTHER): Admission: RE | Admit: 2013-08-17 | Payer: Medicaid Other | Source: Ambulatory Visit | Admitting: Orthopedic Surgery

## 2013-08-17 HISTORY — DX: Carpal tunnel syndrome, left upper limb: G56.02

## 2013-08-17 HISTORY — DX: Unspecified osteoarthritis, unspecified site: M19.90

## 2013-08-17 HISTORY — DX: Rash and other nonspecific skin eruption: R21

## 2013-08-17 HISTORY — DX: Personal history of other specified conditions: Z87.898

## 2013-08-17 SURGERY — CARPAL TUNNEL RELEASE
Anesthesia: Choice | Laterality: Left

## 2013-08-17 MED ORDER — FENTANYL CITRATE 0.05 MG/ML IJ SOLN: 50.0000 ug | INTRAMUSCULAR | Status: DC | PRN

## 2013-08-17 MED ORDER — LACTATED RINGERS IV SOLN: INTRAVENOUS | Status: DC

## 2013-08-17 MED ORDER — DEXTROSE 5 % IV SOLN
3.0000 g | INTRAVENOUS | Status: AC
  Administered 2013-08-18: 3 g via INTRAVENOUS
  Filled 2013-08-17: qty 3000

## 2013-08-17 MED ORDER — MIDAZOLAM HCL 2 MG/ML PO SYRP: 12.0000 mg | ORAL_SOLUTION | Freq: Once | ORAL | Status: DC | PRN

## 2013-08-17 MED ORDER — MIDAZOLAM HCL 2 MG/2ML IJ SOLN: 1.0000 mg | INTRAMUSCULAR | Status: DC | PRN

## 2013-08-17 MED ORDER — CHLORHEXIDINE GLUCONATE 4 % EX LIQD
60.0000 mL | Freq: Once | CUTANEOUS | Status: DC
  Filled 2013-08-17: qty 60

## 2013-08-18 ENCOUNTER — Encounter (HOSPITAL_COMMUNITY): Payer: Medicaid Other | Admitting: Anesthesiology

## 2013-08-18 ENCOUNTER — Ambulatory Visit (HOSPITAL_COMMUNITY): Payer: Medicaid Other | Admitting: Anesthesiology

## 2013-08-18 ENCOUNTER — Encounter (HOSPITAL_COMMUNITY): Payer: Self-pay | Admitting: Anesthesiology

## 2013-08-18 ENCOUNTER — Ambulatory Visit (HOSPITAL_COMMUNITY)
Admission: RE | Admit: 2013-08-18 | Discharge: 2013-08-18 | Disposition: A | Discharge status: Home / Self Care | Payer: Medicaid Other | Source: Ambulatory Visit | Attending: Orthopedic Surgery | Admitting: Orthopedic Surgery

## 2013-08-18 ENCOUNTER — Encounter (HOSPITAL_COMMUNITY)
Admission: RE | Disposition: A | Discharge status: Home / Self Care | Payer: Self-pay | Source: Ambulatory Visit | Attending: Orthopedic Surgery

## 2013-08-18 DIAGNOSIS — Z6841 Body Mass Index (BMI) 40.0 and over, adult: Secondary | ICD-10-CM | POA: Insufficient documentation

## 2013-08-18 DIAGNOSIS — Z79899 Other long term (current) drug therapy: Secondary | ICD-10-CM | POA: Insufficient documentation

## 2013-08-18 DIAGNOSIS — G5600 Carpal tunnel syndrome, unspecified upper limb: Secondary | ICD-10-CM | POA: Diagnosis not present

## 2013-08-18 DIAGNOSIS — E669 Obesity, unspecified: Secondary | ICD-10-CM | POA: Diagnosis not present

## 2013-08-18 DIAGNOSIS — Z87891 Personal history of nicotine dependence: Secondary | ICD-10-CM | POA: Insufficient documentation

## 2013-08-18 HISTORY — PX: CARPAL TUNNEL RELEASE: SHX101

## 2013-08-18 LAB — BASIC METABOLIC PANEL
BUN: 7 mg/dL (ref 6–23)
CO2: 23 mEq/L (ref 19–32)
Calcium: 9.4 mg/dL (ref 8.4–10.5)
Chloride: 105 mEq/L (ref 96–112)
Creatinine, Ser: 0.67 mg/dL (ref 0.50–1.10)
Glucose, Bld: 100 mg/dL — ABNORMAL HIGH (ref 70–99)
POTASSIUM: 4.6 meq/L (ref 3.7–5.3)
SODIUM: 141 meq/L (ref 137–147)

## 2013-08-18 LAB — CBC
HCT: 40.1 % (ref 36.0–46.0)
HEMOGLOBIN: 13 g/dL (ref 12.0–15.0)
MCH: 28.3 pg (ref 26.0–34.0)
MCHC: 32.4 g/dL (ref 30.0–36.0)
MCV: 87.4 fL (ref 78.0–100.0)
PLATELETS: 303 10*3/uL (ref 150–400)
RBC: 4.59 MIL/uL (ref 3.87–5.11)
RDW: 14.6 % (ref 11.5–15.5)
WBC: 12.1 10*3/uL — ABNORMAL HIGH (ref 4.0–10.5)

## 2013-08-18 LAB — HCG, SERUM, QUALITATIVE: PREG SERUM: NEGATIVE

## 2013-08-18 SURGERY — CARPAL TUNNEL RELEASE
Anesthesia: Choice | Site: Hand | Laterality: Left

## 2013-08-18 MED ORDER — BUPIVACAINE HCL (PF) 0.25 % IJ SOLN
INTRAMUSCULAR | Status: AC
  Filled 2013-08-18: qty 30

## 2013-08-18 MED ORDER — HYDROCODONE-ACETAMINOPHEN 5-325 MG PO TABS: ORAL_TABLET | ORAL | Status: DC

## 2013-08-18 MED ORDER — 0.9 % SODIUM CHLORIDE (POUR BTL) OPTIME
TOPICAL | Status: DC | PRN
  Administered 2013-08-18: 1000 mL

## 2013-08-18 MED ORDER — HYDROMORPHONE HCL PF 1 MG/ML IJ SOLN
INTRAMUSCULAR | Status: AC
  Filled 2013-08-18: qty 1

## 2013-08-18 MED ORDER — FENTANYL CITRATE 0.05 MG/ML IJ SOLN
INTRAMUSCULAR | Status: DC | PRN
  Administered 2013-08-18 (×5): 100 ug via INTRAVENOUS

## 2013-08-18 MED ORDER — ONDANSETRON HCL 4 MG/2ML IJ SOLN
INTRAMUSCULAR | Status: DC | PRN
Start: 2013-08-18 — End: 2013-08-18
  Administered 2013-08-18: 4 mg via INTRAVENOUS

## 2013-08-18 MED ORDER — PROPOFOL 10 MG/ML IV BOLUS
INTRAVENOUS | Status: AC
  Filled 2013-08-18: qty 20

## 2013-08-18 MED ORDER — LIDOCAINE HCL (CARDIAC) 20 MG/ML IV SOLN
INTRAVENOUS | Status: DC | PRN
  Administered 2013-08-18: 100 mg via INTRAVENOUS

## 2013-08-18 MED ORDER — PROPOFOL 10 MG/ML IV BOLUS
INTRAVENOUS | Status: DC | PRN
  Administered 2013-08-18 (×4): 100 mg via INTRAVENOUS

## 2013-08-18 MED ORDER — DEXTROSE 5 % IV SOLN
INTRAVENOUS | Status: DC | PRN
  Administered 2013-08-18: 13:00:00 via INTRAVENOUS

## 2013-08-18 MED ORDER — ONDANSETRON HCL 4 MG/2ML IJ SOLN
INTRAMUSCULAR | Status: AC
  Filled 2013-08-18: qty 2

## 2013-08-18 MED ORDER — HYDROMORPHONE HCL PF 1 MG/ML IJ SOLN
0.2500 mg | INTRAMUSCULAR | Status: DC | PRN
  Administered 2013-08-18: 0.5 mg via INTRAVENOUS

## 2013-08-18 MED ORDER — FENTANYL CITRATE 0.05 MG/ML IJ SOLN
INTRAMUSCULAR | Status: AC
  Filled 2013-08-18: qty 5

## 2013-08-18 MED ORDER — BUPIVACAINE HCL (PF) 0.25 % IJ SOLN
INTRAMUSCULAR | Status: DC | PRN
  Administered 2013-08-18: 30 mL

## 2013-08-18 MED ORDER — HYDROCODONE-ACETAMINOPHEN 5-325 MG PO TABS
ORAL_TABLET | ORAL | Status: AC
  Filled 2013-08-18: qty 2

## 2013-08-18 MED ORDER — LACTATED RINGERS IV SOLN
INTRAVENOUS | Status: DC | PRN
  Administered 2013-08-18: 13:00:00 via INTRAVENOUS

## 2013-08-18 MED ORDER — LACTATED RINGERS IV SOLN
INTRAVENOUS | Status: DC
  Administered 2013-08-18: 11:00:00 via INTRAVENOUS

## 2013-08-18 SURGICAL SUPPLY — 41 items
BANDAGE ELASTIC 3 VELCRO ST LF (GAUZE/BANDAGES/DRESSINGS) IMPLANT
BANDAGE ELASTIC 4 VELCRO ST LF (GAUZE/BANDAGES/DRESSINGS) ×2 IMPLANT
BANDAGE GAUZE ELAST BULKY 4 IN (GAUZE/BANDAGES/DRESSINGS) ×2 IMPLANT
BNDG ESMARK 4X9 LF (GAUZE/BANDAGES/DRESSINGS) ×2 IMPLANT
CORDS BIPOLAR (ELECTRODE) ×2 IMPLANT
COVER SURGICAL LIGHT HANDLE (MISCELLANEOUS) ×2 IMPLANT
CUFF TOURNIQUET SINGLE 18IN (TOURNIQUET CUFF) ×2 IMPLANT
CUFF TOURNIQUET SINGLE 24IN (TOURNIQUET CUFF) IMPLANT
DRAPE SURG 17X23 STRL (DRAPES) ×2 IMPLANT
DURAPREP 26ML APPLICATOR (WOUND CARE) ×2 IMPLANT
GAUZE XEROFORM 1X8 LF (GAUZE/BANDAGES/DRESSINGS) ×2 IMPLANT
GLOVE BIO SURGEON STRL SZ7.5 (GLOVE) ×6 IMPLANT
GLOVE BIOGEL PI IND STRL 6.5 (GLOVE) ×2 IMPLANT
GLOVE BIOGEL PI IND STRL 8 (GLOVE) ×1 IMPLANT
GLOVE BIOGEL PI INDICATOR 6.5 (GLOVE) ×2
GLOVE BIOGEL PI INDICATOR 8 (GLOVE) ×1
GLOVE ECLIPSE 7.5 STRL STRAW (GLOVE) ×2 IMPLANT
GOWN BRE IMP PREV XXLGXLNG (GOWN DISPOSABLE) ×2 IMPLANT
GOWN STRL REUS W/ TWL LRG LVL3 (GOWN DISPOSABLE) ×3 IMPLANT
GOWN STRL REUS W/TWL LRG LVL3 (GOWN DISPOSABLE) ×3
KIT BASIN OR (CUSTOM PROCEDURE TRAY) ×2 IMPLANT
KIT ROOM TURNOVER OR (KITS) ×2 IMPLANT
NEEDLE HYPO 25GX1X1/2 BEV (NEEDLE) ×2 IMPLANT
NS IRRIG 1000ML POUR BTL (IV SOLUTION) ×2 IMPLANT
PACK ORTHO EXTREMITY (CUSTOM PROCEDURE TRAY) ×2 IMPLANT
PAD ABD 8X10 STRL (GAUZE/BANDAGES/DRESSINGS) ×2 IMPLANT
PAD ARMBOARD 7.5X6 YLW CONV (MISCELLANEOUS) ×4 IMPLANT
PADDING CAST ABS 4INX4YD NS (CAST SUPPLIES) ×1
PADDING CAST ABS COTTON 4X4 ST (CAST SUPPLIES) ×1 IMPLANT
SPONGE GAUZE 4X4 12PLY (GAUZE/BANDAGES/DRESSINGS) ×2 IMPLANT
SPONGE LAP 4X18 X RAY DECT (DISPOSABLE) ×2 IMPLANT
SUCTION FRAZIER TIP 10 FR DISP (SUCTIONS) ×2 IMPLANT
SUT ETHILON 3 0 PS 1 (SUTURE) ×2 IMPLANT
SUT ETHILON 4 0 PS 2 18 (SUTURE) ×2 IMPLANT
SUT VIC AB 3-0 SH 18 (SUTURE) ×2 IMPLANT
SYR CONTROL 10ML LL (SYRINGE) ×2 IMPLANT
TOWEL OR 17X24 6PK STRL BLUE (TOWEL DISPOSABLE) ×2 IMPLANT
TOWEL OR 17X26 10 PK STRL BLUE (TOWEL DISPOSABLE) ×2 IMPLANT
TUBE CONNECTING 12X1/4 (SUCTIONS) ×2 IMPLANT
UNDERPAD 30X30 INCONTINENT (UNDERPADS AND DIAPERS) ×2 IMPLANT
WATER STERILE IRR 1000ML POUR (IV SOLUTION) ×2 IMPLANT

## 2013-08-18 NOTE — Anesthesia Preprocedure Evaluation (Signed)
Anesthesia Evaluation    Airway       Dental   Pulmonary former smoker,          Cardiovascular     Neuro/Psych    GI/Hepatic   Endo/Other    Renal/GU      Musculoskeletal   Abdominal   Peds  Hematology   Anesthesia Other Findings   Reproductive/Obstetrics                           Anesthesia Physical Anesthesia Plan  ASA: III  Anesthesia Plan:    Post-op Pain Management:    Induction:   Airway Management Planned:   Additional Equipment:   Intra-op Plan:   Post-operative Plan:   Informed Consent:   Plan Discussed with:   Anesthesia Plan Comments:         Anesthesia Quick Evaluation

## 2013-08-18 NOTE — Brief Op Note (Signed)
08/18/2013  2:11 PM  PATIENT:  Dawn Galvan  38 y.o. female  PRE-OPERATIVE DIAGNOSIS:  bilateral carpal tunnel  POST-OPERATIVE DIAGNOSIS:  bilateral carpal tunnel  PROCEDURE:  Procedure(s): LEFT CARPAL TUNNEL RELEASE (Left)  SURGEON:  Surgeon(s) and Role:    * Tami RibasKevin R Kuzma, MD - Primary  PHYSICIAN ASSISTANT:   ASSISTANTS: none   ANESTHESIA:   local and IV sedation  EBL:  Total I/O In: 350 [I.V.:350] Out: -   BLOOD ADMINISTERED:none  DRAINS: none   LOCAL MEDICATIONS USED:  MARCAINE     SPECIMEN:  No Specimen  DISPOSITION OF SPECIMEN:  N/A  COUNTS:  YES  TOURNIQUET:   Total Tourniquet Time Documented: Forearm (Left) - 26 minutes Total: Forearm (Left) - 26 minutes   DICTATION: .Other Dictation: Dictation Number [id number]  PLAN OF CARE: Discharge to home after PACU  PATIENT DISPOSITION:  PACU - hemodynamically stable.

## 2013-08-18 NOTE — H&P (Signed)
Dawn Galvan is an 38 y.o. female.   Chief Complaint: carpal tunnel syndrome HPI: 38 yo rhd female with greater than three years symptoms in bilateral hands.  Pins and needles sensation.  Nocturnal symptoms nightly that wake her and cause her to shake her hands to get relief.  Positive nerve conduction studies.  She wishes to have a carpal tunnel release for management of symptoms.  Past Medical History  Diagnosis Date  . Obesity   . Headache(784.0)     migraines or sinus  . Shortness of breath     with lying supine  . History of vertigo     states comes and goes; no current med.  . Osteoarthritis   . Rash 08/10/2013    right thigh  . Carpal tunnel syndrome of left wrist 07/2013    Past Surgical History  Procedure Laterality Date  . Wisdom tooth extraction  11/20/10  . Toe surgery Left     second toe. rod placed  . Cesarean section    . Cholecystectomy    . Hysteroplasty repair of uterine anomaly    . Hysteroscopy w/d&c  01/13/2012    Procedure: DILATATION AND CURETTAGE /HYSTEROSCOPY;  Surgeon: Osborne Oman, MD;  Location: [facility];  Service: Gynecology;  Laterality: N/A;  Pap smear  . Hernia repair      x 2    Family History  Problem Relation Age of Onset  . Hypertension Mother   . Hypertension Father   . Diabetes Maternal Grandmother   . Diabetes Paternal Grandmother    Social History:  reports that she quit smoking about 4 years ago. She has never used smokeless tobacco. She reports that she does not drink alcohol or use illicit drugs.  Allergies: No Known Allergies  Medications Prior to Admission  Medication Sig Dispense Refill  . acetaminophen (TYLENOL) 500 MG tablet Take 1,500 mg by mouth every 6 (six) hours as needed (for pain).      . Tetrahydrozoline HCl (EYE DROPS OP) Place 2 drops into both eyes 2 (two) times daily as needed (for dry eyes). OTC allergy eye drops        Results for orders placed during the hospital encounter of 08/18/13 (from the past 48  hour(s))  BASIC METABOLIC PANEL     Status: Abnormal   Collection Time    08/18/13  9:57 AM      Result Value Ref Range   Sodium 141  137 - 147 mEq/L   Potassium 4.6  3.7 - 5.3 mEq/L   Chloride 105  96 - 112 mEq/L   CO2 23  19 - 32 mEq/L   Glucose, Bld 100 (*) 70 - 99 mg/dL   BUN 7  6 - 23 mg/dL   Creatinine, Ser 0.67  0.50 - 1.10 mg/dL   Calcium 9.4  8.4 - 10.5 mg/dL   GFR calc non Af Amer >90  >90 mL/min   GFR calc Af Amer >90  >90 mL/min   Comment: (NOTE)     The eGFR has been calculated using the CKD EPI equation.     This calculation has not been validated in all clinical situations.     eGFR's persistently <90 mL/min signify possible Chronic Kidney     Disease.  CBC     Status: Abnormal   Collection Time    08/18/13  9:57 AM      Result Value Ref Range   WBC 12.1 (*) 4.0 - 10.5 K/uL  RBC 4.59  3.87 - 5.11 MIL/uL   Hemoglobin 13.0  12.0 - 15.0 g/dL   HCT 40.1  36.0 - 46.0 %   MCV 87.4  78.0 - 100.0 fL   MCH 28.3  26.0 - 34.0 pg   MCHC 32.4  30.0 - 36.0 g/dL   RDW 14.6  11.5 - 15.5 %   Platelets 303  150 - 400 K/uL  HCG, SERUM, QUALITATIVE     Status: None   Collection Time    08/18/13  9:57 AM      Result Value Ref Range   Preg, Serum NEGATIVE  NEGATIVE   Comment:            THE SENSITIVITY OF THIS     METHODOLOGY IS >10 mIU/mL.    No results found.   A comprehensive review of systems was negative except for: Eyes: positive for contacts/glasses Behavioral/Psych: positive for anxiety and sleep disturbance  Blood pressure 164/84, pulse 84, temperature 98.6 F (37 C), temperature source Oral, resp. rate 20, height '5\' 4"'  (1.626 m), weight 171.942 kg (379 lb 1 oz), last menstrual period 05/18/2013, SpO2 100.00%.  General appearance: alert, cooperative and appears stated age Head: Normocephalic, without obvious abnormality, atraumatic Neck: supple, symmetrical, trachea midline Resp: clear to auscultation bilaterally Cardio: regular rate and rhythm GI: non  tender Extremities: intact sensation and capillary refill all digits.  +epl/fpl/io.  no wounds. Pulses: 2+ and symmetric Skin: Skin color, texture, turgor normal. No rashes or lesions Neurologic: Grossly normal Incision/Wound: none  Assessment/Plan Bilateral carpal tunnel syndrome.  Non operative and operative treatment options were discussed with the patient and patient wishes to proceed with operative treatment. Risks, benefits, and alternatives of surgery were discussed and the patient agrees with the plan of care.   KUZMA,KEVIN R 08/18/2013, 11:06 AM

## 2013-08-18 NOTE — Progress Notes (Signed)
Pt. Vague regarding LMP, states the IUD has been removed, she has an expenon implant in R arm, states last period was several months ago.

## 2013-08-18 NOTE — Anesthesia Procedure Notes (Signed)
Procedure Name: MAC Date/Time: 08/18/2013 1:29 PM Performed by: Wray KearnsFOLEY, ROGER A Pre-anesthesia Checklist: Patient identified, Timeout performed, Emergency Drugs available, Suction available and Patient being monitored Patient Re-evaluated:Patient Re-evaluated prior to inductionOxygen Delivery Method: Nasal cannula Preoxygenation: Pre-oxygenation with 100% oxygen Intubation Type: IV induction Airway Equipment and Method: Patient positioned with wedge pillow Placement Confirmation: positive ETCO2 Dental Injury: Teeth and Oropharynx as per pre-operative assessment

## 2013-08-18 NOTE — Discharge Instructions (Signed)

## 2013-08-18 NOTE — Transfer of Care (Signed)
Immediate Anesthesia Transfer of Care Note  Patient: Dawn Galvan  Procedure(s) Performed: Procedure(s): LEFT CARPAL TUNNEL RELEASE (Left)  Patient Location: PACU  Anesthesia Type:MAC  Level of Consciousness: oriented, sedated, patient cooperative and responds to stimulation  Airway & Oxygen Therapy: Patient Spontanous Breathing and Patient connected to nasal cannula oxygen  Post-op Assessment: Report given to PACU RN, Post -op Vital signs reviewed and stable, Patient moving all extremities and Patient moving all extremities X 4  Post vital signs: Reviewed and stable  Complications: No apparent anesthesia complications

## 2013-08-19 NOTE — Op Note (Signed)
Dawn Galvan:  Hottinger, [name]                  ACCOUNT NO.:  [id number]  MEDICAL RECORD NO.:  [id number]  LOCATION:  [facility]                         FACILITY:  [facility]  PHYSICIAN:  Betha LoaKevin Kuzma, MD        DATE OF BIRTH:  07/17/1975  DATE OF PROCEDURE:  08/18/2013 DATE OF DISCHARGE:  08/18/2013                              OPERATIVE REPORT   PREOPERATIVE DIAGNOSIS:  Left carpal tunnel syndrome.  POSTOPERATIVE DIAGNOSIS:  Left carpal tunnel syndrome.  PROCEDURE:  Left carpal tunnel release.  SURGEON:  Betha LoaKevin Kuzma, MD  ASSISTANTS:  None.  ANESTHESIA:  IV sedation with local.  IV FLUIDS:  Per anesthesia flow sheet.  ESTIMATED BLOOD LOSS:  Minimal.  COMPLICATIONS:  None.  SPECIMENS:  None.  TOURNIQUET TIME:  26 minutes.  DISPOSITION:  Stable to PACU.  INDICATIONS:  [name] is a 38 year old female who has had symptoms in bilateral hands for 3 years or more.  She has pins and needle sensation. She has positive nerve conduction studies.  She has nocturnal symptoms that wake her at night causing her to shake her hands to gain relief. She wished to have a carpal tunnel release for management of symptoms. Risks, benefits, and alternatives of surgery were discussed including risk of blood loss, infection, damage to nerves, vessels, tendons, ligaments, bone, failure of surgery, need for additional surgery, complications with wound healing, continued pain, continued carpal tunnel syndrome.  She voiced understanding of these risks and elected to proceed.  OPERATIVE COURSE:  After being identified preoperatively by myself, the patient and I agreed upon procedure and site of procedure.  Surgical site was marked.  The risks, benefits, and alternatives of surgery were reviewed and she wished to proceed.  Surgical consent had been signed. She was given IV Ancef as preoperative antibiotic prophylaxis.  She was transferred to the operating room and placed on the operating room table in supine  position with left upper extremity on arm board.  IV sedation was induced by anesthesiologist.  A surgical pause was performed between surgeons, anesthesia, and operating room staff, and all were in agreement as to the patient, procedure, and site of procedure.  Local anesthesia in the surgical area was infiltrated with 10 mL of 0.25% plain Marcaine.  The left upper extremity was then prepped and draped in normal sterile orthopedic fashion.  A surgical pause was again performed between surgeons, anesthesia, and operating room staff, and all were in agreement as to the patient, procedure, and site of procedure.  Tourniquet at the proximal aspect of the forearm was inflated to 250 mmHg after exsanguination with and esmark bandage.  An incision was made over the transverse carpal ligament and carried through the skin and subcutaneous tissues by a spreading technique. Bipolar electrocautery was used to obtain hemostasis.  The palmar fascia was sharply incised.  Transverse carpal ligament was identified.  It was sharply incised with the knife blade.  It was released distally.  Care was taken to ensure complete decompression distally.  Skin was incised proximally.  Scissors were used to split the distal aspect of the volar antebrachial fascia.  The wound was flattened and hyperemic.  The motor  branch was identified and was intact.  The wound was copiously irrigated with sterile saline.  It was then closed with 4-0 nylon in a horizontal mattress fashion.  It was then dressed with sterile Xeroform, 4x4s, and ABD and wrapped with Kerlix and Ace bandage.  Tourniquet was deflated at 26 minutes.  Fingertips were pink with brisk capillary refill after deflation of the tourniquet.  The operative drapes were broken down and the patient was awoken from anesthesia safely.  She was transferred back to stretcher and taken to PACU in stable condition.  I will see her back in the office in 1 week for  postoperative followup.  I will give her Norco 5/325, 1-2 p.o. q.6 hours p.r.n. pain, dispensed #30.     Betha LoaKevin Kuzma, MD     KK/MEDQ  D:  08/18/2013  T:  08/19/2013  Job:  [id number]

## 2013-08-22 ENCOUNTER — Encounter (HOSPITAL_COMMUNITY): Payer: Self-pay | Admitting: Orthopedic Surgery

## 2013-08-28 NOTE — Anesthesia Postprocedure Evaluation (Signed)
  Anesthesia Post-op Note  Patient: Dawn Galvan  Procedure(s) Performed: Procedure(s): LEFT CARPAL TUNNEL RELEASE (Left)  Patient Location: PACU  Anesthesia Type:MAC  Level of Consciousness: awake, alert , oriented and patient cooperative  Airway and Oxygen Therapy: Patient Spontanous Breathing  Post-op Pain: none  Post-op Assessment: Post-op Vital signs reviewed, Patient's Cardiovascular Status Stable, Respiratory Function Stable, Patent Airway, No signs of Nausea or vomiting and Adequate PO intake  Post-op Vital Signs: stable  Last Vitals:  Filed Vitals:   08/18/13 1542  BP: 109/45  Pulse: 81  Temp: 36.7 C  Resp: 20    Complications: No apparent anesthesia complications

## 2013-10-03 ENCOUNTER — Ambulatory Visit: Payer: Medicaid Other | Attending: Orthopedic Surgery | Admitting: Occupational Therapy

## 2013-10-03 DIAGNOSIS — M6281 Muscle weakness (generalized): Secondary | ICD-10-CM | POA: Insufficient documentation

## 2013-10-03 DIAGNOSIS — IMO0001 Reserved for inherently not codable concepts without codable children: Secondary | ICD-10-CM | POA: Diagnosis present

## 2013-10-03 DIAGNOSIS — M25549 Pain in joints of unspecified hand: Secondary | ICD-10-CM | POA: Diagnosis not present

## 2013-12-25 ENCOUNTER — Encounter (HOSPITAL_COMMUNITY): Payer: Self-pay | Admitting: Orthopedic Surgery

## 2014-02-07 DIAGNOSIS — Z6841 Body Mass Index (BMI) 40.0 and over, adult: Secondary | ICD-10-CM | POA: Insufficient documentation

## 2014-02-07 HISTORY — DX: Morbid (severe) obesity due to excess calories: E66.01

## 2014-02-23 HISTORY — PX: LAPAROSCOPIC GASTRIC SLEEVE RESECTION: SHX5895

## 2014-06-08 DIAGNOSIS — R7303 Prediabetes: Secondary | ICD-10-CM | POA: Insufficient documentation

## 2014-11-11 DIAGNOSIS — G4733 Obstructive sleep apnea (adult) (pediatric): Secondary | ICD-10-CM | POA: Insufficient documentation

## 2015-07-10 ENCOUNTER — Telehealth: Payer: Self-pay | Admitting: Physician Assistant

## 2015-07-10 NOTE — Telephone Encounter (Signed)
APT. REMINDER CALL, LMTCB °

## 2015-07-11 ENCOUNTER — Ambulatory Visit: Payer: Medicaid Other | Admitting: Internal Medicine

## 2015-07-24 ENCOUNTER — Telehealth: Payer: Self-pay | Admitting: Physician Assistant

## 2015-07-24 NOTE — Telephone Encounter (Signed)
APT. REMINDER CALL, LMTCB °

## 2015-07-25 ENCOUNTER — Ambulatory Visit: Payer: Medicaid Other | Admitting: Internal Medicine

## 2015-08-02 ENCOUNTER — Encounter: Payer: Self-pay | Admitting: Internal Medicine

## 2015-08-02 ENCOUNTER — Ambulatory Visit (INDEPENDENT_AMBULATORY_CARE_PROVIDER_SITE_OTHER): Payer: Self-pay | Admitting: Internal Medicine

## 2015-08-02 VITALS — BP 114/72 | HR 80 | Temp 98.5°F | Resp 18 | Ht 64.5 in | Wt 323.6 lb

## 2015-08-02 DIAGNOSIS — G5601 Carpal tunnel syndrome, right upper limb: Secondary | ICD-10-CM

## 2015-08-02 DIAGNOSIS — F329 Major depressive disorder, single episode, unspecified: Secondary | ICD-10-CM

## 2015-08-02 DIAGNOSIS — G5603 Carpal tunnel syndrome, bilateral upper limbs: Secondary | ICD-10-CM

## 2015-08-02 DIAGNOSIS — F32A Depression, unspecified: Secondary | ICD-10-CM

## 2015-08-02 DIAGNOSIS — M62830 Muscle spasm of back: Secondary | ICD-10-CM

## 2015-08-02 DIAGNOSIS — Z9884 Bariatric surgery status: Secondary | ICD-10-CM

## 2015-08-02 DIAGNOSIS — Z6841 Body Mass Index (BMI) 40.0 and over, adult: Secondary | ICD-10-CM

## 2015-08-02 DIAGNOSIS — E669 Obesity, unspecified: Secondary | ICD-10-CM

## 2015-08-02 MED ORDER — CYCLOBENZAPRINE HCL 5 MG PO TABS: 5.0000 mg | ORAL_TABLET | Freq: Three times a day (TID) | ORAL | Status: DC | PRN

## 2015-08-02 MED ORDER — CITALOPRAM HYDROBROMIDE 20 MG PO TABS: 20.0000 mg | ORAL_TABLET | Freq: Every day | ORAL | Status: DC

## 2015-08-02 NOTE — Patient Instructions (Signed)
[name],  Thank you for coming to see us today. For your muscle spasms, the most important things are to get back to exercising. There are no specific precautions, just to be careful when using her back. I would ask the trainers to help work on back strengthening exercises as well. At work, make sure to stand up and stretch for 1-2 minutes every hour as well since this has a lot to do with the spasms. You can always use ibuprofen for muscle soreness as well. I have prescribed a short course of muscle relaxants to use only if you're having a severe spasm. These medications are not to be taken regularly, only when you're having bad symptoms. Finally, ask one of your friends to give you massage! This should help a lot as well.  [facility] your carpal tunnel[facility] and [facility] if [facility] wrist splint will fit and where it nightly. This should help with her symptoms. We will check back in a few months to see if this helps, and if not we can have you see a surgeon again for a possible release surgery.  Finally, we will start you on a medication similar to Prozac, called Celexa, which works the exact same way but has less weight gain side effects. Both the muscle relaxant and Celexa cost $4 [facility] [place].  Let us know if you have any other questions and you can always call if you have any other issues that you want us to address.  Thanks, Reubin MilanBilly Kareli Hossain

## 2015-08-05 DIAGNOSIS — M62830 Muscle spasm of back: Secondary | ICD-10-CM | POA: Insufficient documentation

## 2015-08-05 DIAGNOSIS — F329 Major depressive disorder, single episode, unspecified: Secondary | ICD-10-CM | POA: Insufficient documentation

## 2015-08-05 DIAGNOSIS — G5601 Carpal tunnel syndrome, right upper limb: Secondary | ICD-10-CM | POA: Insufficient documentation

## 2015-08-05 DIAGNOSIS — G5600 Carpal tunnel syndrome, unspecified upper limb: Secondary | ICD-10-CM | POA: Insufficient documentation

## 2015-08-05 DIAGNOSIS — F32A Depression, unspecified: Secondary | ICD-10-CM | POA: Insufficient documentation

## 2015-08-05 NOTE — Progress Notes (Signed)
Internal Medicine Clinic Attending  Case discussed with Dr. Kennedy at the time of the visit.  We reviewed the resident's history and exam and pertinent patient test results.  I agree with the assessment, diagnosis, and plan of care documented in the resident's note.  

## 2015-08-05 NOTE — Assessment & Plan Note (Signed)
Was previously on Prozac for depression. Her only main depressive symptom at this time is avolition, which in combination with her muscle spasms, have caused her to not exercises much. Otherwise, feels that her symptoms are not affecting her severely and more significantly than usual. She has not taken Prozac for the past month and a half due to cost. She is interested in restarting an SSRI. -Counseled patient that of the many SSRIs, Prozac does have a tendency for weight gain and that other alternatives may be better for her -Started patient on citalopram 20 mg daily -GAD at next visit -Consider increasing dose to 40 mg daily (also on [facility] $4 list) if symptoms still not well controlled. Consider a trial for at least 6 months

## 2015-08-05 NOTE — Progress Notes (Signed)
   Patient ID: Dawn Galvan female   DOB: 11-May-1975 40 y.o.   MRN: [id number]  Subjective:   HPI: Dawn Galvan is a 40 y.o. with PMH of morbid obesity s/p gastric sleeve 10/2014 and depression who [facility] to establish care in our clinic as a new patient. Today, her only acute complaint is of back spasms.   Please see problem-based charting for status of medical issues pertinent to this visit.  Review of Systems: Pertinent items noted in HPI and remainder of comprehensive ROS otherwise negative.  Objective:  Physical Exam: Filed Vitals:   08/02/15 1339  BP: 114/72  Pulse: 80  Temp: 98.5 F (36.9 C)  TempSrc: Oral  Resp: 18  Height: 5' 4.5" (1.638 m)  Weight: 323 lb 9.6 oz (146.784 kg)  SpO2: 99%   Gen: Well-appearing, alert and oriented to person, place, and time HEENT: Oropharynx clear without erythema or exudate.  Neck: No cervical LAD, no thyromegaly or nodules, no JVD noted. CV: Normal rate, regular rhythm, no murmurs, rubs, or gallops Pulmonary: Normal effort, CTA bilaterally, no wheezing, rales, or rhonchi Abdominal: Soft, non-tender, non-distended, without rebound, guarding, or masses Extremities: Distal pulses 2+ in upper and lower extremities bilaterally, no tenderness, erythema or edema MSK: Nontender knot 3cm in diameter over the right lumbar paraspinal muscles. Normal ROM without pain. Neurologic exam normal. Skin: No atypical appearing moles. No rashes  Assessment & Plan:  Please see problem-based charting for assessment and plan.  Reubin MilanBilly Ladawna Walgren, MD Resident Physician, PGY-1 Department of Internal Medicine [facility]

## 2015-08-05 NOTE — Assessment & Plan Note (Signed)
For the past 2 months, she has noted intermittent sharp pain on the right side of her lower back, not associated with any activity, and not improved or worsened by any identifiable factors. She says these episodes only last a few seconds, and are actually decreasing in severity over the past few weeks, now once daily or sometimes every other day. 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 She was previously exercising several times a week with a personal trainer, but the trainer wanted clearance from a physician before reinitiating exercise. I believe this is most likely due to muscular strain in the paraspinal muscles. Although she does not exhibit point tenderness, there is a knot over the area that symptoms occur. Intermittently in between these sharp episodes, she does not have pain. -Instructed patient on regular stretching at work, at least every hour getting up for 1-2 minutes to stretch -Counseled patient that continued exercise with focus on back strengthening may in fact improve her symptoms -Massage therapy over affected area -Prescribed short course of as needed cyclobenzaprine only to be taken if spasms occur -May also use OTC topical therapy such as Aspercreme

## 2015-08-05 NOTE — Assessment & Plan Note (Signed)
Patient already underwent release surgery in 2014 in her left hand, now with progressive worsening symptoms with nighttime and morning numbness in her right hand over the past few years. She has always had bilateral symptoms, but deferred right-sided release as it is her dominant hand. She does not have symptoms in her left hand after the release. She previously was unable to use a splint because of her obesity, but now with 70 pound weight loss over the past several months, may be a candidate. -Instructed patient to try using a wrist splint at night in the right hand -Told patient to call us if splints do not fit and we can refer to surgery for evaluation of right carpal tunnel release -Follow-up at next visit

## 2015-08-05 NOTE — Assessment & Plan Note (Signed)
Since her gastric sleeve 9 months ago, she has lost roughly 70 pounds but has plateaued recently given that she has not exercised in the past month and a half from intermittent back spasms. Please see muscle spasm of the back assessment and plan for further detail. She is being monitored with routine lab work by her Teacher, English as a foreign languagebariatric surgeon, last seen 3 months ago. -Continue follow-up with bariatrics -Continue multivitamin, vitamin D, calcium -Continue biotin, zinc, for hair loss -Continue Benefiber and Imodium as needed for diarrhea

## 2015-08-15 ENCOUNTER — Ambulatory Visit: Payer: Medicaid Other

## 2015-08-22 ENCOUNTER — Ambulatory Visit: Payer: Medicaid Other

## 2015-09-04 DIAGNOSIS — Z9884 Bariatric surgery status: Secondary | ICD-10-CM | POA: Insufficient documentation

## 2015-09-05 ENCOUNTER — Ambulatory Visit: Payer: Medicaid Other

## 2015-12-04 ENCOUNTER — Telehealth: Payer: Self-pay | Admitting: Internal Medicine

## 2015-12-04 NOTE — Telephone Encounter (Signed)
Apt. Reminder call, lmtcb °

## 2016-02-10 ENCOUNTER — Ambulatory Visit: Payer: Medicaid Other

## 2016-02-25 ENCOUNTER — Telehealth: Payer: Self-pay | Admitting: Internal Medicine

## 2016-02-25 NOTE — Telephone Encounter (Signed)
APT. REMINDER CALL, LMTCB °

## 2016-02-26 ENCOUNTER — Ambulatory Visit: Payer: Medicaid Other

## 2016-02-27 ENCOUNTER — Telehealth: Payer: Self-pay | Admitting: Internal Medicine

## 2016-02-27 NOTE — Telephone Encounter (Signed)
APT. REMINDER CALL, LMTCB °

## 2016-02-28 ENCOUNTER — Ambulatory Visit (INDEPENDENT_AMBULATORY_CARE_PROVIDER_SITE_OTHER): Payer: Medicaid Other | Admitting: Internal Medicine

## 2016-02-28 ENCOUNTER — Ambulatory Visit: Payer: Medicaid Other

## 2016-02-28 ENCOUNTER — Encounter (INDEPENDENT_AMBULATORY_CARE_PROVIDER_SITE_OTHER): Payer: Self-pay

## 2016-02-28 VITALS — BP 108/87 | HR 74 | Temp 98.2°F | Wt 321.6 lb

## 2016-02-28 DIAGNOSIS — R350 Frequency of micturition: Secondary | ICD-10-CM | POA: Insufficient documentation

## 2016-02-28 DIAGNOSIS — R10812 Left upper quadrant abdominal tenderness: Secondary | ICD-10-CM | POA: Diagnosis not present

## 2016-02-28 DIAGNOSIS — Z87891 Personal history of nicotine dependence: Secondary | ICD-10-CM | POA: Diagnosis not present

## 2016-02-28 DIAGNOSIS — R10811 Right upper quadrant abdominal tenderness: Secondary | ICD-10-CM | POA: Diagnosis not present

## 2016-02-28 DIAGNOSIS — Z87440 Personal history of urinary (tract) infections: Secondary | ICD-10-CM

## 2016-02-28 DIAGNOSIS — F329 Major depressive disorder, single episode, unspecified: Secondary | ICD-10-CM | POA: Diagnosis not present

## 2016-02-28 DIAGNOSIS — Z6841 Body Mass Index (BMI) 40.0 and over, adult: Secondary | ICD-10-CM | POA: Diagnosis not present

## 2016-02-28 DIAGNOSIS — R631 Polydipsia: Secondary | ICD-10-CM

## 2016-02-28 DIAGNOSIS — K117 Disturbances of salivary secretion: Secondary | ICD-10-CM

## 2016-02-28 DIAGNOSIS — Z9884 Bariatric surgery status: Secondary | ICD-10-CM | POA: Diagnosis not present

## 2016-02-28 LAB — GLUCOSE, CAPILLARY: GLUCOSE-CAPILLARY: 90 mg/dL (ref 65–99)

## 2016-02-28 LAB — POCT GLYCOSYLATED HEMOGLOBIN (HGB A1C): Hemoglobin A1C: 5.8

## 2016-02-28 NOTE — Progress Notes (Signed)
   CC: Frequent urination  HPI:  [name] is a 41 y.o. woman with a history of morbid obesity status post gastric sleeve, anxiety, frequent headaches who is here today for excessive thirst and urinary frequency. This problem is ongoing since about October of last year. She was seen in an emergency department visit once in September for urinary tract infection that was treated and resolved. She has frequent urge to urinate with no episodes of incontinence. Nocturia is about 1-2 episodes per night. There is a large amount of urine when she does go to the bathroom and she feels appropriately emptied afterwards. She denies any associated pain, blood, or odor with her urine. She does also have dry eyes for which she started recently using lubricating eyedrops at least daily.  She has had limited access to healthcare since her previous visit in May of last year due to insurance coverage. She scheduled a visit today [facility] [place] [facility].   See problem based assessment and plan below for additional details  Past Medical History:  Diagnosis Date  . 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 Headache(784.0)    migraines or sinus  . History of vertigo    states comes and goes; no current med.  . Obesity   . Osteoarthritis   . Rash 08/10/2013   right thigh  . Shortness of breath    with lying supine    Review of Systems:  Review of Systems  Constitutional: Negative for malaise/fatigue.  HENT:       Dry mouth  Eyes: Negative for blurred vision.       Dry eyes  Respiratory: Negative for shortness of breath.   Cardiovascular: Negative for leg swelling.  Gastrointestinal: Positive for diarrhea.  Genitourinary: Positive for frequency and urgency. Negative for dysuria.  Musculoskeletal: Negative for myalgias.  Skin: Negative for rash.  Neurological: Negative for sensory change.  Endo/Heme/Allergies: Positive for polydipsia.    Physical  Exam: Physical Exam  Constitutional: She is oriented to person, place, and time and well-developed, well-nourished, and in no distress.  HENT:  Mouth/Throat: Oropharynx is clear and moist.  Eyes: Conjunctivae are normal.  Neck: Normal range of motion. Neck supple.  Cardiovascular: Normal rate and regular rhythm.   Pulmonary/Chest: Effort normal and breath sounds normal.  Abdominal: Soft.  Mild bilateral upper quadrant tenderness, BS normal  Musculoskeletal: She exhibits no edema.  Lymphadenopathy:    She has no cervical adenopathy.  Neurological: She is alert and oriented to person, place, and time.  Skin: Skin is warm and dry.  Psychiatric: Affect normal.    Vitals:   02/28/16 1105  BP: 108/87  Pulse: 74  Temp: 98.2 F (36.8 C)  TempSrc: Oral  SpO2: 98%  Weight: (!) 321 lb 9.6 oz (145.9 kg)    Assessment & Plan:   See Encounters Tab for problem based charting.  Patient discussed with Dr. Cleda DaubE. Hoffman

## 2016-02-29 LAB — URINALYSIS, ROUTINE W REFLEX MICROSCOPIC
Bilirubin, UA: NEGATIVE
GLUCOSE, UA: NEGATIVE
KETONES UA: NEGATIVE
LEUKOCYTES UA: NEGATIVE
Nitrite, UA: NEGATIVE
Protein, UA: NEGATIVE
RBC, UA: NEGATIVE
SPEC GRAV UA: 1.024 (ref 1.005–1.030)
Urobilinogen, Ur: 1 mg/dL (ref 0.2–1.0)
pH, UA: 5 (ref 5.0–7.5)

## 2016-02-29 LAB — CBC
Hematocrit: 39.3 % (ref 34.0–46.6)
Hemoglobin: 13 g/dL (ref 11.1–15.9)
MCH: 29 pg (ref 26.6–33.0)
MCHC: 33.1 g/dL (ref 31.5–35.7)
MCV: 88 fL (ref 79–97)
PLATELETS: 296 10*3/uL (ref 150–379)
RBC: 4.49 x10E6/uL (ref 3.77–5.28)
RDW: 15.8 % — AB (ref 12.3–15.4)
WBC: 9.3 10*3/uL (ref 3.4–10.8)

## 2016-02-29 LAB — BMP8+ANION GAP
ANION GAP: 17 mmol/L (ref 10.0–18.0)
BUN / CREAT RATIO: 12 (ref 9–23)
BUN: 9 mg/dL (ref 6–24)
CO2: 22 mmol/L (ref 18–29)
Calcium: 9 mg/dL (ref 8.7–10.2)
Chloride: 104 mmol/L (ref 96–106)
Creatinine, Ser: 0.75 mg/dL (ref 0.57–1.00)
GFR, EST AFRICAN AMERICAN: 115 mL/min/{1.73_m2} (ref >59)
GFR, EST NON AFRICAN AMERICAN: 100 mL/min/{1.73_m2} (ref >59)
Glucose: 92 mg/dL (ref 65–99)
POTASSIUM: 4.3 mmol/L (ref 3.5–5.2)
Sodium: 143 mmol/L (ref 134–144)

## 2016-03-02 NOTE — Assessment & Plan Note (Signed)
A: [name] now has a several month history of xerostomia and urinary frequency. Given her obesity medical history screening for diabetes was performed but with negative findings. Urinalysis is entirely bland without protein, glucose, blood, or bacteria. Isolated urologic abnormalities should generally not be associated with excessive thirst and mouth dryness. It is possible that she is having dry mouth and some urinary frequency is secondary to polydipsia. Possible causes at this time include a sicca syndrome but physical exam does not reveal dry membranes or advanced for age dental disease. She has mild dry eye complaints at this time. Medication-induced xerostomia is reported in up to 20% of patients on citalopram which is a possible cause although this medicine was started on his current dose in May of last year and her symptoms did not start until about September. Her depression is been well controlled since getting out of this medicine though so I would not want to try discontinuing it without a change to a different therapy. Unfortunately xerostomia is reported as a side effect in most antidepressants at varying incidence so alternative therapies might not improve this effect.  - Checking UA, CBC, Bmet today - Will recommend trial of alternative class antidepressant buproprion probably if no alternative cause identified

## 2016-03-03 NOTE — Progress Notes (Signed)
Internal Medicine Clinic Attending  Case discussed with Dr. Rice at the time of the visit.  We reviewed the resident's history and exam and pertinent patient test results.  I agree with the assessment, diagnosis, and plan of care documented in the resident's note.  

## 2016-03-04 ENCOUNTER — Encounter: Payer: Self-pay | Admitting: Internal Medicine

## 2016-03-04 ENCOUNTER — Telehealth: Payer: Self-pay | Admitting: Internal Medicine

## 2016-03-04 DIAGNOSIS — F3341 Major depressive disorder, recurrent, in partial remission: Secondary | ICD-10-CM

## 2016-03-04 DIAGNOSIS — R350 Frequency of micturition: Secondary | ICD-10-CM

## 2016-03-04 MED ORDER — BUPROPION HCL 100 MG PO TABS: 100.0000 mg | ORAL_TABLET | Freq: Two times a day (BID) | ORAL | 0 refills | Status: DC

## 2016-03-04 NOTE — Telephone Encounter (Signed)
Discussed the recent test results with [name] that were checked during her recent encounter for urinary frequency including her basic metabolic panel, hemoglobin A1c, and urinalysis that were all unremarkable. This suggests against any metabolic or intrinsic renal causes for her urinary frequency. When asked about symptoms again today she is continuing to have a very dry mouth and having urinary frequency. It's possible she has an isolated urologic problem and also ongoing xerostomia but I'm suspicious that these could be related and so for urinary symptoms are due to some polydipsia.  After discussion she was amenable to temporally holding her Celexa and giving a trial of bupropion for depression treatment in the meantime. I sent this prescription to her preferred pharmacy and recommended her to follow up in 3 weeks after starting this medicine taken discuss its efficacy and if her urinary symptoms are improving.

## 2016-03-04 NOTE — Telephone Encounter (Signed)
Wonder if patient has Interstitial Cystitis? Would question/screen for this during next office visit - if not scheduled with me (pelvic fullness or pain, bladder pain when full - relieved by urination, dysuria, dyspareunia). Could consider trial of timed-voiding, hydroxyzine in the mean time. Would also benefit from TCA if IC.

## 2016-03-04 NOTE — Telephone Encounter (Signed)
Pt calling back for test results °

## 2016-03-06 ENCOUNTER — Telehealth: Payer: Self-pay

## 2016-03-06 NOTE — Telephone Encounter (Signed)
Needs to speak with the nurse regarding meds. Please call back.

## 2016-03-09 ENCOUNTER — Encounter: Payer: Self-pay | Admitting: Obstetrics & Gynecology

## 2016-03-09 ENCOUNTER — Ambulatory Visit (INDEPENDENT_AMBULATORY_CARE_PROVIDER_SITE_OTHER): Payer: Medicaid Other | Admitting: Obstetrics & Gynecology

## 2016-03-09 VITALS — BP 114/59 | HR 78 | Ht 64.5 in | Wt 318.8 lb

## 2016-03-09 DIAGNOSIS — Z3049 Encounter for surveillance of other contraceptives: Secondary | ICD-10-CM | POA: Diagnosis present

## 2016-03-09 DIAGNOSIS — Z3046 Encounter for surveillance of implantable subdermal contraceptive: Secondary | ICD-10-CM

## 2016-03-09 DIAGNOSIS — Z30017 Encounter for initial prescription of implantable subdermal contraceptive: Secondary | ICD-10-CM

## 2016-03-09 MED ORDER — ETONOGESTREL 68 MG ~~LOC~~ IMPL
68.0000 mg | DRUG_IMPLANT | Freq: Once | SUBCUTANEOUS | Status: AC
  Administered 2016-03-09: 68 mg via SUBCUTANEOUS

## 2016-03-09 NOTE — Progress Notes (Signed)
GYNECOLOGY OFFICE PROCEDURE NOTE  Dawn Galvan is a 41 y.o. [id number] here for Nexplanon removal and Nexplanon insertion.  Last pap smear was on 01/13/2012 and was normal.  No other gynecologic concerns.  Nexplanon Removal and Insertion  Patient identified, informed consent performed, consent signed.   Patient does understand that irregular bleeding is a very common side effect of this medication. She was advised to have backup contraception for one week after replacement of the implant. Pregnancy test in clinic today was negative.  Appropriate time out taken.Nexplanon site identified. Area prepped in usual sterile fashon. One ml of 1% lidocaine was used to anesthetize the area at the distal end of the implant. A small stab incision was made right beside the implant on the distal portion. The Nexplanon rod was grasped using hemostats and removed without difficulty. There was minimal blood loss. There were no complications. Area was then injected with 3 ml of 1 % lidocaine. She was re-prepped with betadine, Nexplanon removed from packaging, Device confirmed in needle, then inserted full length of needle and withdrawn per handbook instructions. Nexplanon was able to palpated in the patient's arm; patient palpated the insert herself.  There was minimal blood loss. Patient insertion site covered with guaze and a pressure bandage to reduce any bruising. The patient tolerated the procedure well and was given post procedure instructions.  She was advised to have backup contraception for one week.    Dawn PhenixJames G Arnold, MD Attending Obstetrician & Gynecologist, [facility]

## 2016-03-09 NOTE — Patient Instructions (Signed)
Etonogestrel implant °What is this medicine? °ETONOGESTREL (et oh noe JES trel) is a contraceptive (birth control) device. It is used to prevent pregnancy. It can be used for up to 3 years. °COMMON BRAND NAME(S): Implanon, Nexplanon °What should I tell my health care provider before I take this medicine? °They need to know if you have any of these conditions: °-abnormal vaginal bleeding °-blood vessel disease or blood clots °-cancer of the breast, cervix, or liver °-depression °-diabetes °-gallbladder disease °-headaches °-heart disease or recent heart attack °-high blood pressure °-high cholesterol °-kidney disease °-liver disease °-renal disease °-seizures °-tobacco smoker °-an unusual or allergic reaction to etonogestrel, other hormones, anesthetics or antiseptics, medicines, foods, dyes, or preservatives °-pregnant or trying to get pregnant °-breast-feeding °How should I use this medicine? °This device is inserted just under the skin on the inner side of your upper arm by a health care professional. °Talk to your pediatrician regarding the use of this medicine in children. Special care may be needed. °What if I miss a dose? °This does not apply. °What may interact with this medicine? °Do not take this medicine with any of the following medications: °-amprenavir °-bosentan °-fosamprenavir °This medicine may also interact with the following medications: °-barbiturate medicines for inducing sleep or treating seizures °-certain medicines for fungal infections like ketoconazole and itraconazole °-griseofulvin °-medicines to treat seizures like carbamazepine, felbamate, oxcarbazepine, phenytoin, topiramate °-modafinil °-phenylbutazone °-rifampin °-some medicines to treat HIV infection like atazanavir, indinavir, lopinavir, nelfinavir, tipranavir, ritonavir °-St. John's wort °What should I watch for while using this medicine? °This product does not protect you against HIV infection (AIDS) or other sexually transmitted  diseases. °You should be able to feel the implant by pressing your fingertips over the skin where it was inserted. Contact your doctor if you cannot feel the implant, and use a non-hormonal birth control method (such as condoms) until your doctor confirms that the implant is in place. If you feel that the implant may have broken or become bent while in your arm, contact your healthcare provider. °What side effects may I notice from receiving this medicine? °Side effects that you should report to your doctor or health care professional as soon as possible: °-allergic reactions like skin rash, itching or hives, swelling of the face, lips, or tongue °-breast lumps °-changes in emotions or moods °-depressed mood °-heavy or prolonged menstrual bleeding °-pain, irritation, swelling, or bruising at the insertion site °-scar at site of insertion °-signs of infection at the insertion site such as fever, and skin redness, pain or discharge °-signs of pregnancy °-signs and symptoms of a blood clot such as breathing problems; changes in vision; chest pain; severe, sudden headache; pain, swelling, warmth in the leg; trouble speaking; sudden numbness or weakness of the face, arm or leg °-signs and symptoms of liver injury like dark yellow or brown urine; general ill feeling or flu-like symptoms; light-colored stools; loss of appetite; nausea; right upper belly pain; unusually weak or tired; yellowing of the eyes or skin °-unusual vaginal bleeding, discharge °-signs and symptoms of a stroke like changes in vision; confusion; trouble speaking or understanding; severe headaches; sudden numbness or weakness of the face, arm or leg; trouble walking; dizziness; loss of balance or coordination °Side effects that usually do not require medical attention (report to your doctor or health care professional if they continue or are bothersome): °-acne °-back pain °-breast pain °-changes in weight °-dizziness °-general ill feeling or flu-like  symptoms °-headache °-irregular menstrual bleeding °-nausea °-sore throat °-vaginal   irritation or inflammation °Where should I keep my medicine? °This drug is given in a hospital or clinic and will not be stored at home. °© 2017 [employer]vier/Gold Standard (2015-03-14 10:56:20) ° °

## 2016-03-10 NOTE — Telephone Encounter (Signed)
Pt is calling back, needs to speak with a nurse regarding meds.

## 2016-03-10 NOTE — Telephone Encounter (Signed)
Have lm for rtc x 2

## 2016-03-18 ENCOUNTER — Ambulatory Visit: Payer: Medicaid Other | Admitting: Student

## 2016-03-18 ENCOUNTER — Telehealth: Payer: Self-pay | Admitting: *Deleted

## 2016-03-18 NOTE — Telephone Encounter (Signed)
Pt left message stating that she would like to be seen by a provider.  She is having pain in her arm @ the Nexplanon site. I returned pt's call and left message stating that we can see her in the office today @ 3pm. If she is not able to keep this appt, please call back to reschedule.

## 2016-03-19 ENCOUNTER — Telehealth: Payer: Self-pay

## 2016-03-19 ENCOUNTER — Ambulatory Visit: Payer: Medicaid Other

## 2016-03-19 ENCOUNTER — Telehealth: Payer: Self-pay | Admitting: Internal Medicine

## 2016-03-19 NOTE — Telephone Encounter (Signed)
APT. REMINDER CALL, LMTCB °

## 2016-03-19 NOTE — Telephone Encounter (Signed)
Pt called and requested that she get another appt other than the 3:20pm she was offered for today from the front office.  Called the pt and informed her that it is the soonest appt that we can could get for her, today at 3:20pm.  The next appt will not be until February 5th in the am unless cancellations.  Pt stated that she has to pick up daughter from school and does not want to take chances with that so she will go to the emergency room and pt hung up.

## 2016-03-20 ENCOUNTER — Ambulatory Visit: Payer: Medicaid Other

## 2016-03-26 ENCOUNTER — Telehealth: Payer: Self-pay | Admitting: Internal Medicine

## 2016-03-26 NOTE — Telephone Encounter (Signed)
APT. REMINDER CALL, LMTCB °

## 2016-03-27 ENCOUNTER — Ambulatory Visit: Payer: Medicaid Other

## 2016-03-30 ENCOUNTER — Ambulatory Visit: Payer: Medicaid Other | Admitting: Obstetrics and Gynecology

## 2016-04-28 ENCOUNTER — Ambulatory Visit: Payer: Medicaid Other

## 2016-04-29 ENCOUNTER — Telehealth: Payer: Self-pay | Admitting: Internal Medicine

## 2016-04-29 NOTE — Telephone Encounter (Signed)
APT. REMINDER CALL, LMTCB °

## 2016-04-30 ENCOUNTER — Ambulatory Visit: Payer: Medicaid Other

## 2016-04-30 ENCOUNTER — Telehealth: Payer: Self-pay

## 2016-04-30 NOTE — Telephone Encounter (Signed)
Pt was called and she immediately states she cant keep wasting her time by coming in to get her medicine filled, she was ask that she make an appt and she said she had issues, I informed her if there were transportation issues we could help and she said "forget you, im through" and hung up

## 2016-04-30 NOTE — Telephone Encounter (Signed)
Needs to speak with a nurse regarding buPROPion (WELLBUTRIN) 100 MG tablet. Please call back.

## 2016-05-01 ENCOUNTER — Ambulatory Visit: Payer: Medicaid Other

## 2016-05-01 NOTE — Telephone Encounter (Signed)
Interesting... Thank you for handling this Myriam JacobsonHelen. Please let me know if she requests any more medications, or if she continues to be disrespectful.

## 2016-06-24 ENCOUNTER — Telehealth: Payer: Self-pay | Admitting: Internal Medicine

## 2016-06-24 NOTE — Telephone Encounter (Signed)
APT. REMINDER CALL, LMTCB °

## 2016-06-25 ENCOUNTER — Ambulatory Visit (HOSPITAL_COMMUNITY)
Admission: RE | Admit: 2016-06-25 | Discharge: 2016-06-25 | Disposition: A | Discharge status: Home / Self Care | Payer: Medicaid Other | Source: Ambulatory Visit | Attending: Oncology | Admitting: Oncology

## 2016-06-25 ENCOUNTER — Ambulatory Visit (INDEPENDENT_AMBULATORY_CARE_PROVIDER_SITE_OTHER): Payer: Medicaid Other | Admitting: Internal Medicine

## 2016-06-25 VITALS — BP 122/94 | HR 92 | Temp 98.7°F | Resp 20 | Ht 64.5 in | Wt 322.0 lb

## 2016-06-25 DIAGNOSIS — M25571 Pain in right ankle and joints of right foot: Secondary | ICD-10-CM

## 2016-06-25 DIAGNOSIS — F419 Anxiety disorder, unspecified: Secondary | ICD-10-CM

## 2016-06-25 DIAGNOSIS — M79671 Pain in right foot: Secondary | ICD-10-CM | POA: Diagnosis present

## 2016-06-25 DIAGNOSIS — E669 Obesity, unspecified: Secondary | ICD-10-CM

## 2016-06-25 DIAGNOSIS — F3341 Major depressive disorder, recurrent, in partial remission: Secondary | ICD-10-CM

## 2016-06-25 DIAGNOSIS — F329 Major depressive disorder, single episode, unspecified: Secondary | ICD-10-CM | POA: Diagnosis not present

## 2016-06-25 DIAGNOSIS — M79673 Pain in unspecified foot: Secondary | ICD-10-CM | POA: Insufficient documentation

## 2016-06-25 DIAGNOSIS — M7731 Calcaneal spur, right foot: Secondary | ICD-10-CM | POA: Diagnosis not present

## 2016-06-25 MED ORDER — SERTRALINE HCL 50 MG PO TABS: 50.0000 mg | ORAL_TABLET | Freq: Every day | ORAL | 2 refills | Status: DC

## 2016-06-25 NOTE — Assessment & Plan Note (Signed)
Was on prozac, switched to citalopram due to weight gain concern with prozac. Had dry mouth with citalopram and was put on wellbutrin for her anxiety+depression. She stopped taking it due to headaches.  Wants to go back on medication for her depression and anxiety.  Will try zoloft 50mg  daily for now. f/up in 6 weeks.

## 2016-06-25 NOTE — Patient Instructions (Addendum)
Start taking zoloft 50mg  daily for your mood. Give it few weeks to work. Come back in 6 weeks for follow up.  Will get xray of your ankle.   Take ibuprofen 600mg  every 4 hours for 3-4 days.

## 2016-06-25 NOTE — Progress Notes (Signed)
   CC: right heel pain  HPI:  Dawn Galvan is a 41 y.o. with PMH listed below, here with right heel pain  Past Medical History:  Diagnosis Date  . 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 Headache(784.0)    migraines or sinus  . History of vertigo    states comes and goes; no current med.  . Obesity   . Osteoarthritis   . Rash 08/10/2013   right thigh  . Shortness of breath    with lying supine   Right heel pain x2 weeks on the back of the ankle area, no injuries, pain radiates to plantar area with walking, also hurts to press the brake in the car. No numbness/tingling. No fevers, no swelling. No hx of gout.   Also wants to start taking medication for her depression/anxiety. Was on prozac >> celexa >> wellbutrtin, wellbutrin d/ced by patient due to headaches. NO SI/HI/AVH.  Review of Systems:    Review of Systems  Eyes: Negative for blurred vision and double vision.  Cardiovascular: Negative for chest pain and palpitations.  Musculoskeletal: Positive for joint pain.  Neurological: Negative for dizziness and headaches.  Psychiatric/Behavioral: Positive for depression. The patient is nervous/anxious.      Physical Exam:  Vitals:   06/25/16 1552  BP: (!) 122/94  Pulse: 92  Resp: 20  Temp: 98.7 F (37.1 C)  TempSrc: Oral  SpO2: 99%  Weight: (!) 322 lb (146.1 kg)  Height: 5' 4.5" (1.638 m)   Physical Exam  Constitutional: She is oriented to person, place, and time. She appears well-developed and well-nourished.  Obese female.  Cardiovascular: Normal rate and regular rhythm.  Exam reveals no gallop and no friction rub.   No murmur heard. Respiratory: Effort normal and breath sounds normal. No respiratory distress. She has no wheezes.  Musculoskeletal: Normal range of motion.  Right ankle: Has tenderness to palpation over the posterior medial ankle, also over the achilles tendon. Has pain with inversion of the right ankle. Other movements don't cause pain.    No swelling or redness.  Neurological: She is alert and oriented to person, place, and time.    Assessment & Plan:   See Encounters Tab for problem based charting.  Patient discussed with Dr. Heide SparkNarendra

## 2016-06-25 NOTE — Assessment & Plan Note (Signed)
Right heel pain x2 weeks on the back of the ankle area, no injuries, pain radiates to plantar area with walking, also hurts to press the brake in the car. No numbness/tingling. No fevers, no swelling. No hx of gout.   Has tenderness over the R achilles tendon and posterior lateral ankle joint. Unclear cause, no injuries. No swelling or erythema to suggest gout.  This is likely achilles tendonitis. Will rule out fracture or subluxation with Xray of the ankle. Continue supportive care for now with scheduled ibuprofen 600mg  every 4-6 hours.

## 2016-07-01 NOTE — Progress Notes (Signed)
Internal Medicine Clinic Attending  Case discussed with Dr. Ahmed at the time of the visit.  We reviewed the resident's history and exam and pertinent patient test results.  I agree with the assessment, diagnosis, and plan of care documented in the resident's note. 

## 2016-07-06 ENCOUNTER — Telehealth: Payer: Self-pay | Admitting: Internal Medicine

## 2016-07-06 NOTE — Telephone Encounter (Signed)
ADVIL NOT WORKING FOR ANKLE PAIN, WANTS SOMETHING STRONGER

## 2016-07-07 NOTE — Telephone Encounter (Signed)
Pt is calling back to speak with a nurse about pain. Please call pt back.

## 2016-07-07 NOTE — Telephone Encounter (Signed)
She needs to come in for re-evaluation.

## 2016-07-07 NOTE — Telephone Encounter (Signed)
Per Hme, patient was offered an appt but refused & was very rude on the phone.

## 2016-07-08 ENCOUNTER — Telehealth: Payer: Self-pay | Admitting: Internal Medicine

## 2016-07-08 DIAGNOSIS — M79671 Pain in right foot: Secondary | ICD-10-CM

## 2016-07-08 NOTE — Telephone Encounter (Signed)
Done

## 2016-07-08 NOTE — Telephone Encounter (Signed)
Patient requesting Referral to Sports Medicine.  Please Advise.

## 2016-07-14 ENCOUNTER — Ambulatory Visit (INDEPENDENT_AMBULATORY_CARE_PROVIDER_SITE_OTHER): Payer: Medicaid Other | Admitting: Internal Medicine

## 2016-07-14 ENCOUNTER — Other Ambulatory Visit (HOSPITAL_COMMUNITY)
Admission: RE | Admit: 2016-07-14 | Discharge: 2016-07-14 | Disposition: A | Discharge status: Home / Self Care | Payer: Medicaid Other | Source: Ambulatory Visit | Attending: Internal Medicine | Admitting: Internal Medicine

## 2016-07-14 DIAGNOSIS — J029 Acute pharyngitis, unspecified: Secondary | ICD-10-CM | POA: Insufficient documentation

## 2016-07-14 DIAGNOSIS — K219 Gastro-esophageal reflux disease without esophagitis: Secondary | ICD-10-CM | POA: Insufficient documentation

## 2016-07-14 LAB — RAPID STREP SCREEN (MED CTR MEBANE ONLY): STREPTOCOCCUS, GROUP A SCREEN (DIRECT): POSITIVE — AB

## 2016-07-14 MED ORDER — AMOXICILLIN 500 MG PO TABS: 500.0000 mg | ORAL_TABLET | Freq: Two times a day (BID) | ORAL | 0 refills | Status: DC

## 2016-07-14 MED ORDER — FLUCONAZOLE 150 MG PO TABS: 150.0000 mg | ORAL_TABLET | Freq: Every day | ORAL | 0 refills | Status: DC

## 2016-07-14 MED ORDER — CHILDRENS IBUPROFEN 100 MG/5ML PO SUSP: 600.0000 mg | Freq: Four times a day (QID) | ORAL | 0 refills | Status: DC

## 2016-07-14 MED ORDER — AMOXICILLIN 250 MG/5ML PO SUSR: 500.0000 mg | Freq: Two times a day (BID) | ORAL | 0 refills | Status: AC

## 2016-07-14 NOTE — Patient Instructions (Signed)
It was a pleasure seeing you today. [facility]  I sent a prescription to your pharmacy called amoxicillin. You're to take 1 pill twice daily for 10 days. Please make sure you finish all of these pills.  I will call you with results of the tests we did today.  For pain you may do as we discussed in clinic.

## 2016-07-14 NOTE — Progress Notes (Signed)
   CC: Sore Throat  HPI: [name] is a 41 y.o. female with a past medical history as listed below who presents with Sore throat.   Past Medical History:  Diagnosis Date  . 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 Headache(784.0)    migraines or sinus  . History of vertigo    states comes and goes; no current med.  . Obesity   . Osteoarthritis   . Rash 08/10/2013   right thigh  . Shortness of breath    with lying supine     Review of Systems: Endorses post nasal drip, cough and subjective fevers. Denies n/v. Denies being around sick contacts    Physical Exam: There were no vitals filed for this visit. BP 128/87 (BP Location: Left Wrist, Patient Position: Sitting, Cuff Size: Normal)   Pulse 83   Temp 99.6 F (37.6 C) (Oral)   Wt (!) 320 lb (145.2 kg)   SpO2 99%   BMI 54.08 kg/m   Physical Exam  Constitutional: She is oriented to person, place, and time. She appears well-developed and well-nourished.  HENT:  Mouth/Throat: Oropharyngeal exudate present.  Extremely erythematous tonsils with tonsillar exudate. Very tender submandibular lymph nodes on the right.  Cardiovascular: Normal rate and regular rhythm.  Exam reveals no friction rub.   No murmur heard. Respiratory: Effort normal and breath sounds normal.  GI: Soft. Bowel sounds are normal.  Musculoskeletal: She exhibits no edema.  Neurological: She is alert and oriented to person, place, and time.    Assessment & Plan:  See encounters tab for problem based medical decision making. Patient discussed with Dr. Criselda PeachesMullen  Signed: Thomasene Lotaylor, Vandy Tsuchiya, MD 07/14/2016, 2:09 PM  Pager: [phone]

## 2016-07-14 NOTE — Assessment & Plan Note (Addendum)
Patient presents with 48 hour history of pharyngitis. Associated with this she has had a subjective fever and general malaise. She denies nausea, vomiting or abdominal pain. She denies diarrhea. She denies cough. Only has minimal postnasal drip. She has not been around sick contacts at home. She has not experienced throat pain like this in the past. Her throat pain is so severe that she feels like she is having difficulty swallowing secretions secondary to pain. No concern for airway obstruction. Physical examination reveals enlarged, erythematous tonsils with exudate. Posterior pharyngeal wall erythematous. Palpable tender cervical lymphadenopathy also present. The differential diagnosis for the patient's pharyngitis would be secondary to bacterial or viral etiologies. Another potential concern would be pharyngitis caused by gonorrhea. Patient endorses having new sexual partners and recently having practiced oral sex. She does have quite a lot of pharyngeal exudate although I do not know if these would be considered copious. At this time we'll also order gonorrhea culture of throat. We will order rapid strep test and culture. Centor score of 3. Will empirically treat with antibiotics. -- Amoxicillin 500 mg twice a day 10 days -- Throat lozenges -- Prescribed diflucan 150 mg for yeast infection PPX, pt states every time she takes an antibiotic she gets a yeast infection. I counseled her not to take the medication until she has finished her antibiotic and developed symptoms. She endorsed understanding of this.  ADDENDUM Patient's rapid strep test positive. We'll wait for cultures and gonorrhea culture. Continue management as documented above.

## 2016-07-15 ENCOUNTER — Telehealth: Payer: Self-pay

## 2016-07-15 LAB — CERVICOVAGINAL ANCILLARY ONLY
Chlamydia: NEGATIVE
NEISSERIA GONORRHEA: NEGATIVE

## 2016-07-15 NOTE — Telephone Encounter (Signed)
Patient informed of results.  

## 2016-07-15 NOTE — Progress Notes (Signed)
Internal Medicine Clinic Attending  Case discussed with Dr. Taylor at the time of the visit.  We reviewed the resident's history and exam and pertinent patient test results.  I agree with the assessment, diagnosis, and plan of care documented in the resident's note. 

## 2016-07-15 NOTE — Telephone Encounter (Signed)
Robin from [facility] needs to speak with a nurse about Citalopram.

## 2016-07-15 NOTE — Telephone Encounter (Signed)
Requesting test result. Please call pt back.  

## 2016-07-22 NOTE — Telephone Encounter (Signed)
Per robin at [facility] this has been resolved

## 2016-07-24 ENCOUNTER — Ambulatory Visit (INDEPENDENT_AMBULATORY_CARE_PROVIDER_SITE_OTHER): Payer: Medicaid Other | Admitting: Family Medicine

## 2016-07-24 DIAGNOSIS — M79671 Pain in right foot: Secondary | ICD-10-CM

## 2016-07-24 MED ORDER — PREDNISONE 10 MG PO TABS: ORAL_TABLET | ORAL | 0 refills | Status: DC

## 2016-07-24 NOTE — Progress Notes (Signed)
  Dawn Galvan - [age] y.o. female MRN [id number]  Date of birth: [birth year]  SUBJECTIVE:  Including CC & ROS.   Dawn Galvan is a 41 year old female that is presenting with right Achilles pain. The pain has been occurring for about 2-4 weeks. She has taken ibuprofen with minimal relief. She has significant tenderness over the mid belly of the Achilles. There is significant tenderness just to touch. She's also tried heating and muscle rub on the area. She works from home. The pain is worse with plantar flexion or dorsiflexion. She had wear sandals as shoes hurt when it rubs on it.  ROS: No unexpected weight loss, fever, chills, swelling, instability, muscle pain, numbness/tingling, redness, otherwise see HPI   HISTORY: Past Medical, Surgical, Social, and Family History Reviewed & Updated per EMR.   Pertinent Historical Findings include: PMHx: prediabetes  Surgical: s/p bariatric surgery    Social:  No tobacco use, occasional alcohol use  FHx: HTN, DM2, Cancer  Medications: ibuprofen   DATA REVIEWED: 06/25/16: Right ankle x-ray: Prominent enthesophyte at the posterior calcaneus with minimal spurring at the plantar calcaneus  PHYSICAL EXAM:  VS: BP 138/89   Ht 5\' 4"  (1.626 m)   Wt (!) 316 lb (143.3 kg)   BMI 54.24 kg/m  PHYSICAL EXAM: Gen: NAD, alert, cooperative with exam, morbidly obese HEENT: clear conjunctiva, EOMI CV:  no edema, capillary refill brisk,  Resp: non-labored, normal speech Skin: no rashes, normal turgor  Neuro: no gross deficits.  Psych:  alert and oriented MSK: Right ankle: Significant tenderness to palpation of the mid belly of the Achilles. Normal ankle range of motion. No overlying erythema or swelling. No significant tenderness palpation over the insertion of the Achilles. Normal strength to resistance. Neurovascular intact.  Limited ultrasound: Right achilles:  Hypoechoic change that is superior to the Achilles itself in the soft tissue. The Achilles is  thickened. Calcaneal spur observed.  Summary: Soft tissue swelling is observed as well as thickened Achilles  Ultrasound and interpretation by Clare GandyJeremy Spyros Winch, MD   ASSESSMENT & PLAN:   Heel pain She does have a thickened Achilles but unclear as to why she is having the soft tissue edema. Unclear if this is an infection versus inflammatory change. 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 - Try prednisone for 6 day course. - She will call if symptoms get worse as we may need to try an antibiotic such as Keflex. - Follow-up in 10 days.

## 2016-07-24 NOTE — Assessment & Plan Note (Addendum)
She does have a thickened Achilles but unclear as to why she is having the soft tissue edema. Unclear if this is an infection versus inflammatory change. 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 - Try prednisone for 6 day course. - She will call if symptoms get worse as we may need to try an antibiotic such as Keflex. - Follow-up in 10 days.

## 2016-07-24 NOTE — Patient Instructions (Signed)
Thank you for coming in,   Please let me know if this gets worse with the prednisone.   We may need to try an antibiotic if it does.     Please feel free to call with any questions or concerns at any time, at [phone]. --Dr. Jordan LikesSchmitz

## 2016-07-26 ENCOUNTER — Telehealth: Payer: Self-pay | Admitting: Internal Medicine

## 2016-07-26 DIAGNOSIS — J029 Acute pharyngitis, unspecified: Secondary | ICD-10-CM

## 2016-07-26 MED ORDER — FLUCONAZOLE 150 MG PO TABS: 150.0000 mg | ORAL_TABLET | ORAL | 0 refills | Status: DC

## 2016-07-26 NOTE — Telephone Encounter (Signed)
   Reason for call:   I received a call from [name] at 5:57 PM indicating that she has a yeast infection after completing a course of antibiotics.   Pertinent Data:   Patient was seen for [facility]rep throat in [facility] on 5/22 and treated with a 10 day course of Amoxicillin which she completed on 5/31  Patient reported frequent yeast infections with antibiotics and was prescribed Diflucan 150 mg once which she took on 5/31 as instructed.  She reports experiencing vaginal pruritus on 6/1 and now with a malodor beginning today. She denies any rash or discharge.  She uses [facility] @ [address]   Assessment / Plan / Recommendations:   Will resend Fluconazole 150 mg every 72 hours x 2 doses at preferred pharmacy.  As always, pt is advised that if symptoms worsen or new symptoms arise, they should follow up in clinic, go to an urgent care facility or to to ER for further evaluation.   Darreld McleanPatel, Madeline Pho, MD   07/26/2016, 6:04 PM

## 2016-08-05 ENCOUNTER — Encounter: Payer: Self-pay | Admitting: Student

## 2016-08-05 ENCOUNTER — Ambulatory Visit (INDEPENDENT_AMBULATORY_CARE_PROVIDER_SITE_OTHER): Payer: Medicaid Other | Admitting: Student

## 2016-08-05 DIAGNOSIS — M79671 Pain in right foot: Secondary | ICD-10-CM | POA: Diagnosis not present

## 2016-08-05 MED ORDER — FLUCONAZOLE 100 MG PO TABS: ORAL_TABLET | ORAL | 0 refills | Status: DC

## 2016-08-05 MED ORDER — CEPHALEXIN 500 MG PO CAPS: 500.0000 mg | ORAL_CAPSULE | Freq: Four times a day (QID) | ORAL | 0 refills | Status: DC

## 2016-08-05 NOTE — Progress Notes (Signed)
  Dawn PilonDana C Twersky - 41 y.o. female MRN [id number]  Date of birth: [birth year]  SUBJECTIVE:  Including CC & ROS.  CC: right achilles pain  Presents with right Achilles pain that has been ongoing for about a month. She states that it is swollen.  Occurred after she tweaked it previously.  She was seen a week and a half ago and given prednisone. She was having acute pain and difficulty with even light touch to the area. A fluid pocket was seen superficial to Achilles tendon and thought to be inflammatory. Reports prednisone did not help at all. She denies any fevers or chills. Denies any recent infection or cuts to her leg.  She has problems going up and down stairs and wearing shoes that are not flip flops.     ROS: No unexpected weight loss, fever, chills, instability, muscle pain, numbness/tingling, redness, otherwise see HPI   PMHx - Updated and reviewed.  Contributory factors include: Obesity PSHx - Updated and reviewed.  Contributory factors include:  bariatric surgery FHx - Updated and reviewed.  Contributory factors include:  Negative Social Hx - Updated and reviewed. Contributory factors include: Negative Medications - reviewed   DATA REVIEWED: Previous office visit on ultrasound on 07/24/2016 which showed swelling superficial to the Achilles tendon  PHYSICAL EXAM:  VS: BP:130/83  HR: bpm  TEMP: ( )  RESP:   HT:5\' 4"  (162.6 cm)   WT:(!) 316 lb (143.3 kg)  BMI:54.4 PHYSICAL EXAM: Gen: NAD, alert, cooperative with exam, well-appearing HEENT: clear conjunctiva,  CV:  no edema, capillary refill brisk, normal rate Resp: non-labored Skin: no rashes, normal turgor  Neuro: no gross deficits.  Psych:  alert and oriented  Ankle & Foot: No visible ecchymosis, erythema, ulcers, calluses, blister +swelling of right achilles  Achilles tendon with tenderness to palpation No tenderness on lateral and medial malleolus No sign of peroneal tendon subluxations or tenderness to  palpation Decreased ROM in dorsi- and plantarflexion, but has full ROM inversion, and eversion of the foot; flexion and extension of the toes Strength: 4/5 in plantar and dorsiflexion on right, full strength in all directions on left. Sensation: intact Vascular: intact w/ dorsalis pedis & posterior tibialis pulses 2+  Limited ultrasound of right Achilles tendon:  Increased soft tissue swelling seen superficial to Achilles tendon Achilles tendon itself appears intact with no hypoechoic or hyperechoic changes Increased blood flow seen to the soft tissue swelling  Summary: Findings consistent with soft tissue swelling superficial to Achilles tendon  Ultrasound performed and interpreted by Cardell PeachAlicia Danniella Robben, DO   ASSESSMENT & PLAN:   Heel pain Unclear etiology of soft tissue swelling of Achilles tendon. The prednisone did not help. We'll try Keflex to see if this helps for a possible infectious etiology. In 2 days she will follow-up for reassessment. If at that time she reports no improvement in her pain, would recommend an MRI to further assess.

## 2016-08-05 NOTE — Assessment & Plan Note (Addendum)
Unclear etiology of soft tissue swelling of Achilles tendon. The prednisone did not help. We'll try Keflex to see if this helps for a possible infectious etiology. In 2 days she will follow-up for reassessment. If at that time she reports no improvement in her pain, would recommend an MRI to further assess.

## 2016-08-07 ENCOUNTER — Ambulatory Visit: Payer: Medicaid Other | Admitting: Family Medicine

## 2016-08-11 ENCOUNTER — Telehealth: Payer: Self-pay | Admitting: Obstetrics & Gynecology

## 2016-08-11 NOTE — Telephone Encounter (Signed)
Patient called to say she patient has a bump on her labia and wants a nurse to call her. An appointment has been made for 07/09.

## 2016-08-12 NOTE — Telephone Encounter (Signed)
Spoke with patient concerning bump on labia. I advised patient to make sure she is keeping her vagina dry.  She reports buying some cream otc to help with irritation. Patient has appointment on 08/31/2016

## 2016-08-31 ENCOUNTER — Ambulatory Visit: Payer: Medicaid Other | Admitting: Obstetrics & Gynecology

## 2016-08-31 ENCOUNTER — Telehealth: Payer: Self-pay | Admitting: *Deleted

## 2016-08-31 NOTE — Telephone Encounter (Signed)
Dawn HarmanDana called this am  and states she  was supposed to have an appt today at 1:40 but needs to cancel it.States she tried calling appointment line but can;t get through. Would like someone to call her back.

## 2016-09-02 NOTE — Telephone Encounter (Signed)
Notified registars to call patient.

## 2016-09-04 ENCOUNTER — Ambulatory Visit: Payer: Medicaid Other | Admitting: Family Medicine

## 2016-09-23 ENCOUNTER — Other Ambulatory Visit: Payer: Self-pay | Admitting: *Deleted

## 2016-09-23 DIAGNOSIS — F3341 Major depressive disorder, recurrent, in partial remission: Secondary | ICD-10-CM

## 2016-09-24 ENCOUNTER — Telehealth: Payer: Self-pay

## 2016-09-24 NOTE — Telephone Encounter (Signed)
Needs to speak with a nurse about meds. Please call pt back.  

## 2016-09-25 MED ORDER — SERTRALINE HCL 50 MG PO TABS: 50.0000 mg | ORAL_TABLET | Freq: Every day | ORAL | 0 refills | Status: DC

## 2016-09-25 NOTE — Telephone Encounter (Signed)
This has been resolved, sertraline

## 2016-10-21 ENCOUNTER — Other Ambulatory Visit: Payer: Self-pay | Admitting: Internal Medicine

## 2016-10-21 DIAGNOSIS — F3341 Major depressive disorder, recurrent, in partial remission: Secondary | ICD-10-CM

## 2017-03-09 ENCOUNTER — Ambulatory Visit: Payer: Medicaid Other | Admitting: Internal Medicine

## 2017-03-09 ENCOUNTER — Encounter: Payer: Self-pay | Admitting: Internal Medicine

## 2017-03-09 ENCOUNTER — Other Ambulatory Visit: Payer: Self-pay

## 2017-03-09 DIAGNOSIS — M79671 Pain in right foot: Secondary | ICD-10-CM

## 2017-03-09 DIAGNOSIS — M7731 Calcaneal spur, right foot: Secondary | ICD-10-CM | POA: Diagnosis not present

## 2017-03-09 DIAGNOSIS — M7661 Achilles tendinitis, right leg: Secondary | ICD-10-CM

## 2017-03-09 NOTE — Patient Instructions (Signed)
[name],  Is a pleasure meeting you today. A referral has been to podiatry.  Please follow up as needed.

## 2017-03-09 NOTE — Progress Notes (Signed)
   CC: Right heel pain  HPI:  [name] is a 42 y.o. female with history noted below that presents to the acute care clinic for follow-up on right heel pain. Please see problem based charting from the status of patient's chronic medical conditions.  Past Medical History:  Diagnosis Date  . 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 Headache(784.0)    migraines or sinus  . History of vertigo    states comes and goes; no current med.  . Obesity   . Osteoarthritis   . Rash 08/10/2013   right thigh  . Shortness of breath    with lying supine    Review of Systems:  Review of Systems  Constitutional: Negative for chills and fever.  Musculoskeletal: Negative for falls.  Neurological: Negative for tingling and focal weakness.     Physical Exam:  Vitals:   03/09/17 1538  BP: 129/77  Pulse: 80  Temp: 98.4 F (36.9 C)  TempSrc: Oral  SpO2: 100%  Weight: (!) 330 lb 4.8 oz (149.8 kg)  Height: 5\' 4"  (1.626 m)   Physical Exam  Musculoskeletal: She exhibits no edema.  Skin: Skin is warm and dry. No rash noted. No erythema.  Tenderness to right heel and achillis tendon  2+ pedal pulse on right     Assessment & Plan:   See encounters tab for problem based medical decision making.    Patient discussed with Dr. Oswaldo DoneVincent

## 2017-03-09 NOTE — Assessment & Plan Note (Signed)
Assessment: Right heel pain Patient reports a 7 month history of right heel pain. She states that the pain is located to the right heel and there is tenderness over the right Achilles tendon. She states that the pain is sharp and aggravated when walking. She denies any trauma to the area, fever/chills or swelling. She had a right ankle x-ray that showed a prominent enthesophyte at the posterior calcaneus with minimal spurring at the plantar calcaneus.  On exam no swelling or erythema is noted to the right foot. She does have tenderness to the heel and Achilles tendon. She has been seen by sports medicine and has been treated with prednisone and Keflex with little benefit. She has tried NSAIDs with Little benefit. Exam still consistent with achiles tendonitis.  Will refer to podiatry for Cam boot to allow for proper healing.    [facility]

## 2017-03-11 NOTE — Progress Notes (Signed)
Internal Medicine Clinic Attending  Case discussed with Dr. Hoffman at the time of the visit.  We reviewed the resident's history and exam and pertinent patient test results.  I agree with the assessment, diagnosis, and plan of care documented in the resident's note.  

## 2017-03-17 DIAGNOSIS — M47812 Spondylosis without myelopathy or radiculopathy, cervical region: Secondary | ICD-10-CM | POA: Diagnosis not present

## 2017-03-17 DIAGNOSIS — G5601 Carpal tunnel syndrome, right upper limb: Secondary | ICD-10-CM | POA: Diagnosis not present

## 2017-03-17 DIAGNOSIS — M542 Cervicalgia: Secondary | ICD-10-CM | POA: Diagnosis not present

## 2017-03-19 ENCOUNTER — Ambulatory Visit: Payer: Self-pay | Admitting: Podiatry

## 2017-03-24 ENCOUNTER — Ambulatory Visit (HOSPITAL_COMMUNITY)
Admission: RE | Admit: 2017-03-24 | Discharge: 2017-03-24 | Disposition: A | Discharge status: Home / Self Care | Payer: Medicaid Other | Source: Ambulatory Visit | Attending: Internal Medicine | Admitting: Internal Medicine

## 2017-03-24 ENCOUNTER — Encounter: Payer: Self-pay | Admitting: Internal Medicine

## 2017-03-24 ENCOUNTER — Ambulatory Visit: Payer: Medicaid Other | Admitting: Internal Medicine

## 2017-03-24 DIAGNOSIS — R0981 Nasal congestion: Secondary | ICD-10-CM | POA: Diagnosis not present

## 2017-03-24 DIAGNOSIS — R9389 Abnormal findings on diagnostic imaging of other specified body structures: Secondary | ICD-10-CM

## 2017-03-24 DIAGNOSIS — R918 Other nonspecific abnormal finding of lung field: Secondary | ICD-10-CM | POA: Diagnosis present

## 2017-03-24 HISTORY — DX: Abnormal findings on diagnostic imaging of other specified body structures: R93.89

## 2017-03-24 NOTE — Patient Instructions (Signed)
Nice to meet you today Ms. Kot.  We are going to repeat a chest x-ray to start out with which she can get done today upstairs.  This will provide a better view as we can see it more clearly and also from the side.  We will call you with the results and if there is anything further needed.

## 2017-03-24 NOTE — Progress Notes (Signed)
   CC: Abnormal XR   HPI:  [name] is a 41 y.o. fem37ale with a past medical history as described below who presents to the clinic after an abnormality was found on a cervical spine x-ray.  Patient had a cervical spine x-ray performed to evaluate for radicular causes of her arm pain/carpal tunnel symptoms at her orthopedic surgeon's office.  He noted in abnormality in the left upper lobe following up further evaluation.  She denies any respiratory symptoms apart from nasal congestion that leads to an intermittent cough due to postnasal drip.  She denies fever, no true night sweats, no weight loss, no dyspnea, no other symptoms or complaints at this time.  No history of other underlying lung problems.  She was a social smoker many years ago when she was young.  Past Medical History:  Diagnosis Date  . 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 Headache(784.0)    migraines or sinus  . History of vertigo    states comes and goes; no current med.  . Obesity   . Osteoarthritis   . Rash 08/10/2013   right thigh  . Shortness of breath    with lying supine   Review of Systems:  Review of Systems  Constitutional: Negative for weight loss.  HENT: Positive for congestion.   Respiratory: Negative for hemoptysis, shortness of breath and wheezing.   Cardiovascular: Negative for chest pain.     Physical Exam:  Vitals:   03/24/17 1529  BP: (!) 144/86  Pulse: 95  Temp: 98 F (36.7 C)  TempSrc: Oral  SpO2: 100%  Weight: (!) 325 lb 12.8 oz (147.8 kg)   General: Sitting in chair comfortably, no acute distress HEENT: With moist mucous membranes, normal conjunctiva CV: Regular rate and rhythm, no murmur appreciated Resp: Clear breath sounds bilaterally and in left upper lobe, normal work of breathing, no distress  Neuro: Alert and oriented x3 Skin: Warm, dry      Assessment & Plan:   See Encounters Tab for problem based charting.  Patient discussed with Dr. Sandre Kittyaines

## 2017-03-25 ENCOUNTER — Telehealth: Payer: Self-pay | Admitting: Internal Medicine

## 2017-03-25 NOTE — Telephone Encounter (Signed)
   Reason for call:   I received a call from [name] at 6 PM asking about the results of recent chest x-ray.   Pertinent Data:   Apparently patient had a cervical x-ray done at orthopedic office which shows some opacity in right upper lobe-we do not have access to those films, she came to the clinic for follow-up and a repeat chest x-ray was done which was completely normal.  Patient was told about the normal chest x-ray results.   Assessment / Plan / Recommendations:   Patient was told about the normal chest x-ray.  As always, pt is advised that if symptoms worsen or new symptoms arise, they should go to an urgent care facility or to to ER for further evaluation.   Arnetha CourserAmin, Tomma Ehinger, MD   03/25/2017, 6:25 PM

## 2017-03-25 NOTE — Assessment & Plan Note (Signed)
She is presenting after a C-spine performed at her orthopedist office noted an irregularity in her left upper lobe.  Unfortunately, the images are not available to review apart from a paper print off the patient has an hand and no report is available in care everywhere as this particular orthopedic surgeon reads his own films and does not send them for a formal radiologist read.  She is not having any respiratory or other concerning symptoms for an underlying malignancy or other process.  We will repeat a PA and lateral chest x-ray today.  **Repeat chest x-ray did not really demonstrate the observed abnormality on the x-ray pronounced and no other abnormalities were noted by the radiologist.  Patient was attempted to be notified but voicemail box was full.  We will try again at a later time

## 2017-03-26 ENCOUNTER — Ambulatory Visit (INDEPENDENT_AMBULATORY_CARE_PROVIDER_SITE_OTHER): Payer: Medicaid Other

## 2017-03-26 ENCOUNTER — Encounter: Payer: Self-pay | Admitting: Podiatry

## 2017-03-26 ENCOUNTER — Ambulatory Visit: Payer: Medicaid Other | Admitting: Podiatry

## 2017-03-26 VITALS — Ht 63.0 in | Wt 320.0 lb

## 2017-03-26 DIAGNOSIS — M25571 Pain in right ankle and joints of right foot: Secondary | ICD-10-CM

## 2017-03-26 DIAGNOSIS — M7661 Achilles tendinitis, right leg: Secondary | ICD-10-CM | POA: Diagnosis not present

## 2017-03-26 DIAGNOSIS — M216X9 Other acquired deformities of unspecified foot: Secondary | ICD-10-CM

## 2017-03-26 MED ORDER — MELOXICAM 15 MG PO TABS: 15.0000 mg | ORAL_TABLET | Freq: Every day | ORAL | 0 refills | Status: DC

## 2017-03-26 NOTE — Patient Instructions (Signed)

## 2017-03-26 NOTE — Progress Notes (Signed)
Subjective:    Patient ID: Dawn Galvan, female    DOB: 21-Feb-1976, 42 y.o.   MRN: [id number]  HPI Chief Complaint  Patient presents with  . Foot Pain    Right foot and ankle pain   42 y.o. female presents with the above complaint.  Reports pain to the back of the right ankle.  States that she saw another provider who was unable to figure out what was going on with her.  Very frustrated that she was getting nowhere.  Reports pain and tightness to the back of the right ankle.  Chronic in nature.  Past Medical History:  Diagnosis Date  . Carpal tunnel syndrome    right  . 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 GERD (gastroesophageal reflux disease)   . Headache(784.0)    migraines or sinus  . History of vertigo    states comes and goes; no current med.  . Obesity   . Osteoarthritis   . PONV (postoperative nausea and vomiting)   . Rash 08/10/2013   right thigh  . Sleep apnea    does not wear cpap   Past Surgical History:  Procedure Laterality Date  . CARPAL TUNNEL RELEASE Left 08/18/2013   Procedure: LEFT CARPAL TUNNEL RELEASE;  Surgeon: Tami RibasKevin R Kuzma, MD;  Location: [facility] OR;  Service: Orthopedics;  Laterality: Left;  . CARPAL TUNNEL RELEASE Right 04/29/2017   Procedure: RIGHT CARPAL TUNNEL RELEASE;  Surgeon: Betha LoaKuzma, Kevin, MD;  Location: [facility] OR;  Service: Orthopedics;  Laterality: Right;  . CESAREAN SECTION    . CHOLECYSTECTOMY    . HERNIA REPAIR     x 2  . HYSTEROPLASTY REPAIR OF UTERINE ANOMALY    . HYSTEROSCOPY W/D&C  01/13/2012   Procedure: DILATATION AND CURETTAGE /HYSTEROSCOPY;  Surgeon: Tereso NewcomerUgonna A Anyanwu, MD;  Location: [facility] ORS;  Service: Gynecology;  Laterality: N/A;  Pap smear  . LAPAROSCOPIC GASTRIC SLEEVE RESECTION  2016  . TOE SURGERY Left    second toe. rod placed  . WISDOM TOOTH EXTRACTION  11/20/10    Current Outpatient Medications:  .  fluconazole (DIFLUCAN) 150 MG tablet, Take 1 tablet (150 mg total) by mouth daily., Disp: 1 tablet, Rfl: 0 .   HYDROcodone-acetaminophen (NORCO) 5-325 MG tablet, 1-2 tabs po q6 hours prn pain, Disp: 20 tablet, Rfl: 0 .  meloxicam (MOBIC) 15 MG tablet, Take 1 tablet (15 mg total) by mouth daily., Disp: 30 tablet, Rfl: 0 .  Multiple Vitamin (MULTI-VITAMINS) TABS, Take 3 tablets by mouth daily. , Disp: , Rfl:  .  Probiotic Product (PROBIOTIC DAILY PO), Take 1 capsule by mouth daily., Disp: , Rfl:  .  sertraline (ZOLOFT) 50 MG tablet, TAKE 1 TABLET BY MOUTH EVERY DAY, Disp: 30 tablet, Rfl: 2  No Known Allergies    Review of Systems  Musculoskeletal: Positive for arthralgias, joint swelling and myalgias.  All other systems reviewed and are negative.      Objective:   Physical Exam There were no vitals filed for this visit. General AA&O x3. Normal mood and affect.  Vascular Dorsalis pedis and posterior tibial pulses  present 2+ bilaterally  Capillary refill normal to all digits. Pedal hair growth normal.  Neurologic Epicritic sensation grossly present bilaterally.  Dermatologic No open lesions. Interspaces clear of maceration. Nails well groomed and normal in appearance.  Orthopedic: MMT 5/5 in dorsiflexion, plantarflexion, inversion, and eversion bilaterally. Tender to palpation at the posterior calcaneus  right. No pain with calcaneal squeeze right. Ankle  ROM diminished range with pain right. Silfverskiold Test: positive right.   Radiographs: Taken and reviewed. No acute fractures. No evidence of calcaneal stress fracture.    Assessment & Plan:  Patient was evaluated all questions answered  Achilles tendinitis right -Educated on stretching -Rx Meloxicam. -She was taken and reviewed consistent with Haglund's deformity, spurring with calcification of the Achilles tendon -Educated on shoe gear  Follow-up in 6 weeks for recheck

## 2017-03-30 NOTE — Progress Notes (Signed)
See Dr Nelson ChimesAmin telephone note on 1/31.

## 2017-04-01 ENCOUNTER — Other Ambulatory Visit: Payer: Self-pay | Admitting: Orthopedic Surgery

## 2017-04-06 NOTE — Progress Notes (Signed)
Internal Medicine Clinic Attending  Case discussed with Dr. Harden  at the time of the visit.  We reviewed the resident's history and exam and pertinent patient test results.  I agree with the assessment, diagnosis, and plan of care documented in the resident's note.  Alexander N Raines, MD   

## 2017-04-14 ENCOUNTER — Other Ambulatory Visit: Payer: Self-pay | Admitting: Internal Medicine

## 2017-04-14 ENCOUNTER — Telehealth: Payer: Self-pay | Admitting: Internal Medicine

## 2017-04-14 DIAGNOSIS — Z1239 Encounter for other screening for malignant neoplasm of breast: Secondary | ICD-10-CM

## 2017-04-14 NOTE — Telephone Encounter (Signed)
Will put in order for screening mammogram given patients family history of breast cancer in mother. Thanks!

## 2017-04-14 NOTE — Telephone Encounter (Signed)
Pt requesting a Mammogram.  Patient's mother was recently DX with Breast Cancer and would like to have one done as she has never had one done.  Also offered patient an appointment with you but she is sch for a surgery at the beginning of March.  Patient is aware she will need to sch an appt with you soon.

## 2017-04-16 ENCOUNTER — Telehealth: Payer: Self-pay | Admitting: Internal Medicine

## 2017-04-16 NOTE — Telephone Encounter (Signed)
Patient would like a call back about her X-Ray Results.

## 2017-04-16 NOTE — Telephone Encounter (Signed)
Thank you.  Patient called this morning information was given to the patient to call the [facility] @ [phone] and schedule her appointment.

## 2017-04-22 ENCOUNTER — Encounter (HOSPITAL_BASED_OUTPATIENT_CLINIC_OR_DEPARTMENT_OTHER): Payer: Self-pay | Admitting: *Deleted

## 2017-04-28 ENCOUNTER — Other Ambulatory Visit: Payer: Self-pay

## 2017-04-28 ENCOUNTER — Encounter (HOSPITAL_COMMUNITY): Payer: Self-pay | Admitting: *Deleted

## 2017-04-28 MED ORDER — DEXTROSE 5 % IV SOLN
3.0000 g | INTRAVENOUS | Status: AC
  Administered 2017-04-29: 3 g via INTRAVENOUS
  Filled 2017-04-28: qty 3

## 2017-04-28 NOTE — Progress Notes (Signed)
Pt denies SOB, chest pain, and being under the care of a cardiologist. Pt denies having a stress test, echo and cardiac cath. Pt denies having an EKG within the last year. Pt denies recent labs. Pt made aware to stop taking Aspirin, vitamins, fish oil, Probiotics and herbal medications. Do not take any NSAIDs ie: Ibuprofen, Advil, Naproxen (Aleve), Motrin, BC and Goody Powder. Pt verbalized understanding of all pre-op instructions.

## 2017-04-29 ENCOUNTER — Encounter (HOSPITAL_COMMUNITY)
Admission: RE | Disposition: A | Discharge status: Home / Self Care | Payer: Self-pay | Source: Ambulatory Visit | Attending: Orthopedic Surgery

## 2017-04-29 ENCOUNTER — Encounter (HOSPITAL_COMMUNITY): Payer: Self-pay | Admitting: *Deleted

## 2017-04-29 ENCOUNTER — Other Ambulatory Visit: Payer: Self-pay

## 2017-04-29 ENCOUNTER — Ambulatory Visit (HOSPITAL_COMMUNITY)
Admission: RE | Admit: 2017-04-29 | Discharge: 2017-04-29 | Disposition: A | Discharge status: Home / Self Care | Payer: Medicaid Other | Source: Ambulatory Visit | Attending: Orthopedic Surgery | Admitting: Orthopedic Surgery

## 2017-04-29 ENCOUNTER — Telehealth: Payer: Self-pay | Admitting: Internal Medicine

## 2017-04-29 ENCOUNTER — Ambulatory Visit (HOSPITAL_COMMUNITY): Payer: Medicaid Other | Admitting: Certified Registered Nurse Anesthetist

## 2017-04-29 ENCOUNTER — Telehealth: Payer: Self-pay | Admitting: *Deleted

## 2017-04-29 DIAGNOSIS — K219 Gastro-esophageal reflux disease without esophagitis: Secondary | ICD-10-CM | POA: Diagnosis not present

## 2017-04-29 DIAGNOSIS — Z6841 Body Mass Index (BMI) 40.0 and over, adult: Secondary | ICD-10-CM | POA: Insufficient documentation

## 2017-04-29 DIAGNOSIS — E669 Obesity, unspecified: Secondary | ICD-10-CM | POA: Insufficient documentation

## 2017-04-29 DIAGNOSIS — G4730 Sleep apnea, unspecified: Secondary | ICD-10-CM | POA: Diagnosis not present

## 2017-04-29 DIAGNOSIS — Z87891 Personal history of nicotine dependence: Secondary | ICD-10-CM | POA: Insufficient documentation

## 2017-04-29 DIAGNOSIS — G5601 Carpal tunnel syndrome, right upper limb: Secondary | ICD-10-CM | POA: Insufficient documentation

## 2017-04-29 DIAGNOSIS — Z79899 Other long term (current) drug therapy: Secondary | ICD-10-CM | POA: Insufficient documentation

## 2017-04-29 HISTORY — DX: Other specified postprocedural states: Z98.890

## 2017-04-29 HISTORY — DX: Sleep apnea, unspecified: G47.30

## 2017-04-29 HISTORY — DX: Carpal tunnel syndrome, unspecified upper limb: G56.00

## 2017-04-29 HISTORY — DX: Gastro-esophageal reflux disease without esophagitis: K21.9

## 2017-04-29 HISTORY — DX: Other specified postprocedural states: R11.2

## 2017-04-29 HISTORY — PX: CARPAL TUNNEL RELEASE: SHX101

## 2017-04-29 LAB — CBC
HEMATOCRIT: 40.1 % (ref 36.0–46.0)
HEMOGLOBIN: 12.9 g/dL (ref 12.0–15.0)
MCH: 28.7 pg (ref 26.0–34.0)
MCHC: 32.2 g/dL (ref 30.0–36.0)
MCV: 89.1 fL (ref 78.0–100.0)
Platelets: 319 10*3/uL (ref 150–400)
RBC: 4.5 MIL/uL (ref 3.87–5.11)
RDW: 14.7 % (ref 11.5–15.5)
WBC: 11.6 10*3/uL — ABNORMAL HIGH (ref 4.0–10.5)

## 2017-04-29 LAB — POCT PREGNANCY, URINE: Preg Test, Ur: NEGATIVE

## 2017-04-29 SURGERY — CARPAL TUNNEL RELEASE
Anesthesia: General | Laterality: Right

## 2017-04-29 MED ORDER — ONDANSETRON HCL 4 MG/2ML IJ SOLN
INTRAMUSCULAR | Status: DC | PRN
  Administered 2017-04-29: 4 mg via INTRAVENOUS

## 2017-04-29 MED ORDER — ONDANSETRON HCL 4 MG/2ML IJ SOLN
INTRAMUSCULAR | Status: AC
  Filled 2017-04-29: qty 4

## 2017-04-29 MED ORDER — OXYCODONE HCL 5 MG PO TABS
ORAL_TABLET | ORAL | Status: AC
  Administered 2017-04-29: 5 mg via ORAL
  Filled 2017-04-29: qty 1

## 2017-04-29 MED ORDER — OXYCODONE HCL 5 MG/5ML PO SOLN: 5.0000 mg | Freq: Once | ORAL | Status: AC | PRN

## 2017-04-29 MED ORDER — FENTANYL CITRATE (PF) 100 MCG/2ML IJ SOLN
INTRAMUSCULAR | Status: AC
  Administered 2017-04-29: 50 ug via INTRAVENOUS
  Filled 2017-04-29: qty 2

## 2017-04-29 MED ORDER — FENTANYL CITRATE (PF) 100 MCG/2ML IJ SOLN
25.0000 ug | INTRAMUSCULAR | Status: DC | PRN
  Administered 2017-04-29 (×2): 50 ug via INTRAVENOUS

## 2017-04-29 MED ORDER — HYDROCODONE-ACETAMINOPHEN 5-325 MG PO TABS: ORAL_TABLET | ORAL | 0 refills | Status: DC

## 2017-04-29 MED ORDER — MIDAZOLAM HCL 2 MG/2ML IJ SOLN
INTRAMUSCULAR | Status: AC
  Filled 2017-04-29: qty 2

## 2017-04-29 MED ORDER — CHLORHEXIDINE GLUCONATE 4 % EX LIQD: 60.0000 mL | Freq: Once | CUTANEOUS | Status: DC

## 2017-04-29 MED ORDER — 0.9 % SODIUM CHLORIDE (POUR BTL) OPTIME
TOPICAL | Status: DC | PRN
  Administered 2017-04-29: 1000 mL

## 2017-04-29 MED ORDER — BUPIVACAINE HCL (PF) 0.25 % IJ SOLN
INTRAMUSCULAR | Status: AC
  Filled 2017-04-29: qty 30

## 2017-04-29 MED ORDER — PROPOFOL 10 MG/ML IV BOLUS
INTRAVENOUS | Status: AC
  Filled 2017-04-29: qty 20

## 2017-04-29 MED ORDER — FENTANYL CITRATE (PF) 250 MCG/5ML IJ SOLN
INTRAMUSCULAR | Status: AC
  Filled 2017-04-29: qty 5

## 2017-04-29 MED ORDER — BUPIVACAINE HCL (PF) 0.25 % IJ SOLN
INTRAMUSCULAR | Status: DC | PRN
  Administered 2017-04-29: 10 mL

## 2017-04-29 MED ORDER — ONDANSETRON HCL 4 MG/2ML IJ SOLN: 4.0000 mg | Freq: Once | INTRAMUSCULAR | Status: DC | PRN

## 2017-04-29 MED ORDER — MIDAZOLAM HCL 5 MG/5ML IJ SOLN
INTRAMUSCULAR | Status: DC | PRN
  Administered 2017-04-29: 2 mg via INTRAVENOUS

## 2017-04-29 MED ORDER — PROPOFOL 10 MG/ML IV BOLUS
INTRAVENOUS | Status: DC | PRN
  Administered 2017-04-29: 130 mg via INTRAVENOUS
  Administered 2017-04-29: 30 mg via INTRAVENOUS

## 2017-04-29 MED ORDER — LACTATED RINGERS IV SOLN
INTRAVENOUS | Status: DC
  Administered 2017-04-29: 13:00:00 via INTRAVENOUS

## 2017-04-29 MED ORDER — LIDOCAINE 2% (20 MG/ML) 5 ML SYRINGE
INTRAMUSCULAR | Status: DC | PRN
  Administered 2017-04-29: 100 mg via INTRAVENOUS

## 2017-04-29 MED ORDER — OXYCODONE HCL 5 MG PO TABS
5.0000 mg | ORAL_TABLET | Freq: Once | ORAL | Status: AC | PRN
  Administered 2017-04-29: 5 mg via ORAL

## 2017-04-29 MED ORDER — DEXAMETHASONE SODIUM PHOSPHATE 10 MG/ML IJ SOLN
INTRAMUSCULAR | Status: DC | PRN
  Administered 2017-04-29: 10 mg via INTRAVENOUS

## 2017-04-29 MED ORDER — DEXAMETHASONE SODIUM PHOSPHATE 10 MG/ML IJ SOLN
INTRAMUSCULAR | Status: AC
  Filled 2017-04-29: qty 2

## 2017-04-29 MED ORDER — FENTANYL CITRATE (PF) 100 MCG/2ML IJ SOLN
INTRAMUSCULAR | Status: DC | PRN
  Administered 2017-04-29 (×3): 50 ug via INTRAVENOUS
  Administered 2017-04-29: 100 ug via INTRAVENOUS

## 2017-04-29 SURGICAL SUPPLY — 39 items
BANDAGE ACE 3X5.8 VEL STRL LF (GAUZE/BANDAGES/DRESSINGS) ×2 IMPLANT
BANDAGE ACE 4X5 VEL STRL LF (GAUZE/BANDAGES/DRESSINGS) ×2 IMPLANT
BNDG ESMARK 4X9 LF (GAUZE/BANDAGES/DRESSINGS) ×2 IMPLANT
BNDG GAUZE ELAST 4 BULKY (GAUZE/BANDAGES/DRESSINGS) ×2 IMPLANT
CHLORAPREP W/TINT 26ML (MISCELLANEOUS) ×2 IMPLANT
CORDS BIPOLAR (ELECTRODE) ×2 IMPLANT
COVER SURGICAL LIGHT HANDLE (MISCELLANEOUS) ×2 IMPLANT
CUFF TOURNIQUET SINGLE 18IN (TOURNIQUET CUFF) ×2 IMPLANT
CUFF TOURNIQUET SINGLE 24IN (TOURNIQUET CUFF) IMPLANT
DRAPE SURG 17X23 STRL (DRAPES) ×2 IMPLANT
DRSG PAD ABDOMINAL 8X10 ST (GAUZE/BANDAGES/DRESSINGS) ×4 IMPLANT
GAUZE SPONGE 4X4 12PLY STRL (GAUZE/BANDAGES/DRESSINGS) ×2 IMPLANT
GAUZE SPONGE 4X4 12PLY STRL LF (GAUZE/BANDAGES/DRESSINGS) ×2 IMPLANT
GAUZE XEROFORM 1X8 LF (GAUZE/BANDAGES/DRESSINGS) ×2 IMPLANT
GLOVE BIO SURGEON STRL SZ7.5 (GLOVE) ×4 IMPLANT
GLOVE BIOGEL PI IND STRL 8 (GLOVE) ×1 IMPLANT
GLOVE BIOGEL PI INDICATOR 8 (GLOVE) ×1
GOWN STRL REUS W/ TWL LRG LVL3 (GOWN DISPOSABLE) ×2 IMPLANT
GOWN STRL REUS W/TWL LRG LVL3 (GOWN DISPOSABLE) ×2
KIT BASIN OR (CUSTOM PROCEDURE TRAY) ×2 IMPLANT
KIT ROOM TURNOVER OR (KITS) ×2 IMPLANT
NEEDLE HYPO 25GX1X1/2 BEV (NEEDLE) IMPLANT
NS IRRIG 1000ML POUR BTL (IV SOLUTION) ×2 IMPLANT
PACK ORTHO EXTREMITY (CUSTOM PROCEDURE TRAY) ×2 IMPLANT
PAD ABD 8X10 STRL (GAUZE/BANDAGES/DRESSINGS) ×2 IMPLANT
PAD ARMBOARD 7.5X6 YLW CONV (MISCELLANEOUS) ×4 IMPLANT
PADDING CAST ABS 4INX4YD NS (CAST SUPPLIES) ×1
PADDING CAST ABS COTTON 4X4 ST (CAST SUPPLIES) ×1 IMPLANT
SPONGE LAP 4X18 X RAY DECT (DISPOSABLE) ×2 IMPLANT
SUCTION FRAZIER HANDLE 10FR (MISCELLANEOUS)
SUCTION TUBE FRAZIER 10FR DISP (MISCELLANEOUS) IMPLANT
SUT ETHILON 3 0 PS 1 (SUTURE) ×2 IMPLANT
SUT VIC AB 3-0 SH 18 (SUTURE) ×2 IMPLANT
SYR CONTROL 10ML LL (SYRINGE) IMPLANT
TOWEL OR 17X24 6PK STRL BLUE (TOWEL DISPOSABLE) ×2 IMPLANT
TOWEL OR 17X26 10 PK STRL BLUE (TOWEL DISPOSABLE) ×2 IMPLANT
TUBE CONNECTING 12X1/4 (SUCTIONS) IMPLANT
UNDERPAD 30X30 INCONTINENT (UNDERPADS AND DIAPERS) ×2 IMPLANT
WATER STERILE IRR 1000ML POUR (IV SOLUTION) ×2 IMPLANT

## 2017-04-29 NOTE — Telephone Encounter (Signed)
Called and spoke to pt's mother, suggested pt call back when she is well enough to speak, mother in agreement

## 2017-04-29 NOTE — Op Note (Signed)
04/29/2017 [facility] OR                              OPERATIVE REPORT   PREOPERATIVE DIAGNOSIS:  Right carpal tunnel syndrome.  POSTOPERATIVE DIAGNOSIS:  Right carpal tunnel syndrome.  PROCEDURE:  Right carpal tunnel release.  SURGEON:  Betha Loa, MD  ASSISTANT:  none.  ANESTHESIA:  General.  IV FLUIDS:  Per anesthesia flow sheet.  ESTIMATED BLOOD LOSS:  Minimal.  COMPLICATIONS:  None.  SPECIMENS:  None.  TOURNIQUET TIME:    Total Tourniquet Time Documented: Forearm (Right) - 16 minutes Total: Forearm (Right) - 16 minutes   DISPOSITION:  Stable to PACU.  LOCATION: [facility] OR  INDICATIONS:  42 yo female with numbness and tingling in hand.  Positive nerve conduction studies.   She wishes to have a carpal tunnel release for management of her symptoms.  Risks, benefits and alternatives of surgery were discussed including the risk of blood loss; infection; damage to nerves, vessels, tendons, ligaments, bone; failure of surgery; need for additional surgery; complications with wound healing; continued pain; recurrence of carpal tunnel syndrome; and damage to motor branch. She voiced understanding of these risks and elected to proceed.   OPERATIVE COURSE:  After being identified preoperatively by myself, the patient and I agreed upon the procedure and site of procedure.  The surgical site was marked.  The risks, benefits, and alternatives of the surgery were reviewed and she wished to proceed.  Surgical consent had been signed.  She was given IV Ancef as preoperative antibiotic prophylaxis.  She was transferred to the operating room and placed on the operating room table in supine position with the Right upper extremity on an armboard.  General was induced by Anesthesiology.  Right upper extremity was prepped and draped in normal sterile orthopaedic fashion.  A surgical pause was performed between the surgeons, anesthesia, and operating room staff, and all were in agreement as to the patient,  procedure, and site of procedure.  Tourniquet at the proximal aspect of the arm was inflated after exsanguination of the limb with an Esmarch bandage.   Incision was made over the transverse carpal ligament and carried into the subcutaneous tissues by spreading technique.  Bipolar electrocautery was used to obtain hemostasis.  The palmar fascia was sharply incised.  The transverse carpal ligament was identified and sharply incised.  It was incised distally first.  Care was taken to ensure complete decompression distally.  It was then incised proximally.  Scissors were used to split the distal aspect of the volar antebrachial fascia.  A finger was placed into the wound to ensure complete decompression, which was the case.  The nerve was examined.  It was flattened and hyperemic.  The motor branch was identified and was intact.  The wound was copiously irrigated with sterile saline.  It was then closed with 4-0 nylon in a horizontal mattress fashion.  It was injected with 0.25% plain Marcaine to aid in postoperative analgesia.  It was dressed with sterile Xeroform, 4x4s, an ABD, and wrapped with Kerlix and an Ace bandage.  Tourniquet was deflated at 16 minutes.  Fingertips were pink with brisk capillary refill after deflation of the tourniquet.  Operative drapes were broken down.  The patient was awoken from anesthesia safely.  She was transferred back to stretcher and taken to the PACU in stable condition.  I will see her back in the office in 1 week for postoperative  followup.  I will give her a prescription for norco 5/325 1-2 tabs PO q6 hours prn pain, dispense #20.  She stated she needs diflucan whenever she has and iv antibiotic and has already contacted her pcp for a prescription for this.    Tami RibasKUZMA,Sherrine Salberg R, MD Electronically signed, 04/29/17

## 2017-04-29 NOTE — Telephone Encounter (Signed)
Patient and mother called to request med to prevent vaginal yeast infection. Patient had a one time dose of antibiotic via IV prior to today's outpatient surgery. Patient states she "always gets a yeast infection with antibiotic" and wants med on hand "in case". Will route to PCP. Kinnie FeilL. Ducatte, RN, BSN

## 2017-04-29 NOTE — Anesthesia Postprocedure Evaluation (Signed)
Anesthesia Post Note  Patient: Soundra PilonDana C Mcelwee  Procedure(s) Performed: RIGHT CARPAL TUNNEL RELEASE (Right )     Patient location during evaluation: PACU Anesthesia Type: General Level of consciousness: awake and alert Pain management: pain level controlled Vital Signs Assessment: post-procedure vital signs reviewed and stable Respiratory status: spontaneous breathing, nonlabored ventilation, respiratory function stable and patient connected to nasal cannula oxygen Cardiovascular status: blood pressure returned to baseline and stable Postop Assessment: no apparent nausea or vomiting Anesthetic complications: no    Last Vitals:  Vitals:   04/29/17 1500 04/29/17 1515  BP: 122/81 117/67  Pulse: 78 76  Resp: 17 20  Temp:  36.9 C  SpO2: 94% 97%    Last Pain:  Vitals:   04/29/17 1502  TempSrc:   PainSc: 3                  Shervin Cypert COKER

## 2017-04-29 NOTE — Anesthesia Preprocedure Evaluation (Addendum)
Anesthesia Evaluation  Patient identified by MRN, date of birth, ID band Patient awake    Reviewed: Allergy & Precautions, NPO status , Patient's Chart, lab work & pertinent test results  History of Anesthesia Complications (+) PONV and history of anesthetic complications  Airway Mallampati: II  TM Distance: >3 FB Neck ROM: Full    Dental  (+) Teeth Intact, Dental Advisory Given   Pulmonary sleep apnea and Continuous Positive Airway Pressure Ventilation , former smoker,    breath sounds clear to auscultation       Cardiovascular  Rhythm:Regular Rate:Normal     Neuro/Psych    GI/Hepatic GERD  Medicated and Controlled,  Endo/Other    Renal/GU      Musculoskeletal   Abdominal (+) + obese,   Peds  Hematology   Anesthesia Other Findings   Reproductive/Obstetrics                            Anesthesia Physical Anesthesia Plan  ASA: III  Anesthesia Plan: General   Post-op Pain Management:    Induction: Intravenous  PONV Risk Score and Plan: 3 and Ondansetron and Dexamethasone  Airway Management Planned: LMA  Additional Equipment: None  Intra-op Plan:   Post-operative Plan: Extubation in OR  Informed Consent: I have reviewed the patients History and Physical, chart, labs and discussed the procedure including the risks, benefits and alternatives for the proposed anesthesia with the patient or authorized representative who has indicated his/her understanding and acceptance.   Dental advisory given  Plan Discussed with: CRNA, Anesthesiologist and Surgeon  Anesthesia Plan Comments:         Anesthesia Quick Evaluation

## 2017-04-29 NOTE — Anesthesia Procedure Notes (Signed)
Procedure Name: LMA Insertion Date/Time: 04/29/2017 1:57 PM Performed by: Shireen QuanButler, Arcenio Mullaly R, CRNA Pre-anesthesia Checklist: Patient identified, Emergency Drugs available, Suction available and Patient being monitored Patient Re-evaluated:Patient Re-evaluated prior to induction Oxygen Delivery Method: Circle System Utilized Preoxygenation: Pre-oxygenation with 100% oxygen Induction Type: IV induction Ventilation: Mask ventilation without difficulty LMA: LMA inserted LMA Size: 4.0 Number of attempts: 1 Placement Confirmation: positive ETCO2 Tube secured with: Tape Dental Injury: Teeth and Oropharynx as per pre-operative assessment

## 2017-04-29 NOTE — Discharge Instructions (Addendum)

## 2017-04-29 NOTE — Brief Op Note (Signed)
04/29/2017  2:23 PM  PATIENT:  Dawn Galvan  42 y.o. female  PRE-OPERATIVE DIAGNOSIS:  right carpal tunnel syndrome  POST-OPERATIVE DIAGNOSIS:  right carpal tunnel syndrome  PROCEDURE:  Procedure(s): RIGHT CARPAL TUNNEL RELEASE (Right)  SURGEON:  Surgeon(s) and Role:    * Betha LoaKuzma, Ronan Dion, MD - Primary  PHYSICIAN ASSISTANT:   ASSISTANTS: none   ANESTHESIA:   general  EBL:  Minimal  BLOOD ADMINISTERED:none  DRAINS: none   LOCAL MEDICATIONS USED:  MARCAINE     SPECIMEN:  No Specimen  DISPOSITION OF SPECIMEN:  N/A  COUNTS:  YES  TOURNIQUET:   Total Tourniquet Time Documented: Forearm (Right) - 16 minutes Total: Forearm (Right) - 16 minutes   DICTATION: .Note written in EPIC  PLAN OF CARE: Discharge to home after PACU  PATIENT DISPOSITION:  PACU - hemodynamically stable.

## 2017-04-29 NOTE — Telephone Encounter (Signed)
Patient is about to have surgery and she is requesting medicine and they told her to call her pcp. Pls call the mother will answer you are able to speak with her

## 2017-04-29 NOTE — Transfer of Care (Signed)
Immediate Anesthesia Transfer of Care Note  Patient: Dawn Galvan  Procedure(s) Performed: RIGHT CARPAL TUNNEL RELEASE (Right )  Patient Location: PACU  Anesthesia Type:General  Level of Consciousness: awake, oriented and patient cooperative  Airway & Oxygen Therapy: Patient Spontanous Breathing and Patient connected to nasal cannula oxygen  Post-op Assessment: Report given to RN, Post -op Vital signs reviewed and stable and Patient moving all extremities  Post vital signs: Reviewed and stable  Last Vitals:  Vitals:   04/29/17 1309 04/29/17 1430  BP: (!) 146/89 (!) 143/88  Pulse: 89 86  Resp: 20 16  Temp: 37.2 C   SpO2: 100% 99%    Last Pain:  Vitals:   04/29/17 1309  TempSrc: Oral      Patients Stated Pain Goal: 7 (04/29/17 1313)  Complications: No apparent anesthesia complications

## 2017-04-29 NOTE — H&P (Signed)
  Dawn Galvan is an 42 y.o. female.   Chief Complaint: right carpal tunnel syndrome HPI: 42 yo female with numbness in right hand.  Positive nerve conduction studies.  She wishes to have a carpal tunnel release.  Allergies: No Known Allergies  Past Medical History:  Diagnosis Date  . Carpal tunnel syndrome    right  . 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 GERD (gastroesophageal reflux disease)   . Headache(784.0)    migraines or sinus  . History of vertigo    states comes and goes; no current med.  . Obesity   . Osteoarthritis   . PONV (postoperative nausea and vomiting)   . Rash 08/10/2013   right thigh  . Shortness of breath    with lying supine  . Sleep apnea    does not wear cpap    Past Surgical History:  Procedure Laterality Date  . CARPAL TUNNEL RELEASE Left 08/18/2013   Procedure: LEFT CARPAL TUNNEL RELEASE;  Surgeon: Tami RibasKevin R Jomar Denz, MD;  Location: [facility] OR;  Service: Orthopedics;  Laterality: Left;  . CESAREAN SECTION    . CHOLECYSTECTOMY    . HERNIA REPAIR     x 2  . HYSTEROPLASTY REPAIR OF UTERINE ANOMALY    . HYSTEROSCOPY W/D&C  01/13/2012   Procedure: DILATATION AND CURETTAGE /HYSTEROSCOPY;  Surgeon: Tereso NewcomerUgonna A Anyanwu, MD;  Location: [facility];  Service: Gynecology;  Laterality: N/A;  Pap smear  . LAPAROSCOPIC GASTRIC SLEEVE RESECTION  2016  . TOE SURGERY Left    second toe. rod placed  . WISDOM TOOTH EXTRACTION  11/20/10    Family History: Family History  Problem Relation Age of Onset  . Hypertension Mother   . Hypertension Father   . Diabetes Maternal Grandmother   . Diabetes Paternal Grandmother     Social History:   reports that she quit smoking about 8 years ago. She quit after 0.00 years of use. she has never used smokeless tobacco. She reports that she does not drink alcohol or use drugs.  Medications: No medications prior to admission.    No results found for this or any previous visit (from the past 48 hour(s)).  No results  found.   A comprehensive review of systems was negative.  Height 5\' 3"  (1.6 m), weight (!) 145.2 kg (320 lb).  General appearance: alert, cooperative and appears stated age Head: Normocephalic, without obvious abnormality, atraumatic Neck: supple, symmetrical, trachea midline Extremities: Intact sensation and capillary refill all digits.  +epl/fpl/io.  No wounds.  Pulses: 2+ and symmetric Skin: Skin color, texture, turgor normal. No rashes or lesions Neurologic: Grossly normal Incision/Wound:none  Assessment/Plan Right carpal tunnel syndrome.  Non operative and operative treatment options were discussed with the patient and patient wishes to proceed with operative treatment. Risks, benefits, and alternatives of surgery were discussed and the patient agrees with the plan of care.   Jarmel Linhardt R 04/29/2017, 9:31 AM

## 2017-04-30 ENCOUNTER — Encounter (HOSPITAL_COMMUNITY): Payer: Self-pay | Admitting: Orthopedic Surgery

## 2017-05-03 NOTE — Telephone Encounter (Signed)
Pt states she needs to speak with a nurse about yeast infection med. Please call pt back.

## 2017-05-04 ENCOUNTER — Other Ambulatory Visit: Payer: Self-pay | Admitting: Internal Medicine

## 2017-05-04 MED ORDER — FLUCONAZOLE 150 MG PO TABS: 150.0000 mg | ORAL_TABLET | Freq: Every day | ORAL | 0 refills | Status: DC

## 2017-05-04 NOTE — Telephone Encounter (Addendum)
Patient made aware PCP sent rx for diflucan to [facility] on [address]. Dawn Galvan. Zelda Reames, RN, BSN

## 2017-05-06 ENCOUNTER — Ambulatory Visit: Payer: Medicaid Other

## 2017-05-06 ENCOUNTER — Other Ambulatory Visit: Payer: Self-pay | Admitting: Podiatry

## 2017-05-07 ENCOUNTER — Ambulatory Visit: Payer: Medicaid Other | Admitting: Podiatry

## 2017-05-14 ENCOUNTER — Ambulatory Visit: Payer: Medicaid Other | Admitting: Podiatry

## 2017-05-14 DIAGNOSIS — M7661 Achilles tendinitis, right leg: Secondary | ICD-10-CM | POA: Diagnosis not present

## 2017-05-14 DIAGNOSIS — M216X9 Other acquired deformities of unspecified foot: Secondary | ICD-10-CM

## 2017-05-14 MED ORDER — MELOXICAM 15 MG PO TABS: 15.0000 mg | ORAL_TABLET | Freq: Every day | ORAL | 0 refills | Status: DC

## 2017-05-14 NOTE — Progress Notes (Signed)
  Subjective:  Patient ID: Dawn Galvan, female    DOB: [birth year],  MRN: [id number]  No chief complaint on file.  42 y.o. female returns for the above complaint.  States that she is doing much better since she has been stretching out.  States that she is going to the gym.  Objective:  There were no vitals filed for this visit. General AA&O x3. Normal mood and affect.  Vascular Pedal pulses palpable.  Neurologic Epicritic sensation grossly intact.  Dermatologic No open lesions. Skin normal texture and turgor.  Orthopedic: No pain to palpation either foot.  Haglund's deformity right Achilles tendon   Assessment & Plan:  Patient was evaluated and treated and all questions answered.  Achilles Tendonitis R  -No pain today. -Refill meloxicam. -Continue stretching.  Return if symptoms worsen or fail to improve.

## 2017-05-22 ENCOUNTER — Other Ambulatory Visit: Payer: Self-pay | Admitting: Internal Medicine

## 2017-05-22 DIAGNOSIS — F3341 Major depressive disorder, recurrent, in partial remission: Secondary | ICD-10-CM

## 2017-05-26 ENCOUNTER — Ambulatory Visit: Payer: Medicaid Other

## 2017-06-11 ENCOUNTER — Ambulatory Visit: Payer: Medicaid Other

## 2017-06-25 ENCOUNTER — Inpatient Hospital Stay: Admission: RE | Admit: 2017-06-25 | Payer: Medicaid Other | Source: Ambulatory Visit

## 2017-07-03 ENCOUNTER — Other Ambulatory Visit: Payer: Self-pay | Admitting: Podiatry

## 2017-07-12 ENCOUNTER — Ambulatory Visit
Admission: RE | Admit: 2017-07-12 | Discharge: 2017-07-12 | Disposition: A | Discharge status: Home / Self Care | Payer: Medicaid Other | Source: Ambulatory Visit | Attending: Internal Medicine | Admitting: Internal Medicine

## 2017-07-12 DIAGNOSIS — Z1231 Encounter for screening mammogram for malignant neoplasm of breast: Secondary | ICD-10-CM | POA: Diagnosis not present

## 2017-07-12 DIAGNOSIS — Z1239 Encounter for other screening for malignant neoplasm of breast: Secondary | ICD-10-CM

## 2017-08-19 ENCOUNTER — Inpatient Hospital Stay: Payer: Medicaid Other | Attending: Genetic Counselor

## 2017-09-02 ENCOUNTER — Other Ambulatory Visit: Payer: Self-pay | Admitting: Podiatry

## 2017-09-06 ENCOUNTER — Telehealth: Payer: Self-pay | Admitting: Podiatry

## 2017-09-06 ENCOUNTER — Encounter: Payer: Medicaid Other | Admitting: Genetic Counselor

## 2017-09-06 MED ORDER — MELOXICAM 15 MG PO TABS: 15.0000 mg | ORAL_TABLET | Freq: Every day | ORAL | 2 refills | Status: DC

## 2017-09-06 NOTE — Addendum Note (Signed)
Addended by: Alphia Kava'CONNELL, VALERY D on: 09/06/2017 03:21 PM   Modules accepted: Orders

## 2017-09-06 NOTE — Telephone Encounter (Signed)
My pharmacy informed me last week that my refill was not authorized to be filled on the Meloxicam. Dr. Samuella CotaPrice said he would refill it as long as I needed it. Without the Meloxicam I can barely walk because I'm in a lot of pain if I don't have the Meloxicam. I can be reached at [phone]. Thanks. Bye bye.

## 2017-09-06 NOTE — Telephone Encounter (Signed)
I spoke with pt and she states she doesn't know if she will get her medicaid again and Dr. Samuella CotaPrice had said he would refill as long as it helped her. I told her the reason it was denied was because DR. Price had refilled previously and said to return if problem persist. I told pt, I was glad the meloxicam helped with the pain, but we did not want her to mask any symptoms that may cover an change in her achilles tendonitis. I told pt I would refill again +2, and then she would need to get an appt, and to check with [facility] social services they may be able to get [facility].

## 2017-09-10 ENCOUNTER — Encounter: Payer: Self-pay | Admitting: Genetic Counselor

## 2017-09-10 ENCOUNTER — Inpatient Hospital Stay: Payer: Medicaid Other | Attending: Genetic Counselor | Admitting: Genetic Counselor

## 2017-09-10 DIAGNOSIS — Z315 Encounter for genetic counseling: Secondary | ICD-10-CM | POA: Diagnosis not present

## 2017-09-10 DIAGNOSIS — Z8042 Family history of malignant neoplasm of prostate: Secondary | ICD-10-CM

## 2017-09-10 DIAGNOSIS — Z1379 Encounter for other screening for genetic and chromosomal anomalies: Secondary | ICD-10-CM

## 2017-09-10 DIAGNOSIS — Z801 Family history of malignant neoplasm of trachea, bronchus and lung: Secondary | ICD-10-CM | POA: Diagnosis not present

## 2017-09-10 DIAGNOSIS — Z803 Family history of malignant neoplasm of breast: Secondary | ICD-10-CM | POA: Diagnosis not present

## 2017-09-10 DIAGNOSIS — Z8051 Family history of malignant neoplasm of kidney: Secondary | ICD-10-CM | POA: Diagnosis not present

## 2017-09-10 NOTE — Progress Notes (Addendum)
REFERRING PROVIDER: Einar Gip, DO [place], [place] [address]  PRIMARY PROVIDER:  Molt, Bethany, DO  PRIMARY REASON FOR VISIT:  1. Family history of breast cancer   2. Family history of prostate cancer   3. Genetic testing      HISTORY OF PRESENT ILLNESS:   Ms. Amero, a 42 y.o. female, was seen for a [place] cancer genetics consultation at the request of Dr. Danford Bad due to a family history of cancer.  Ms. Uphoff presents to clinic today to discuss the possibility of a hereditary predisposition to cancer, genetic testing, and to further clarify her future cancer risks, as well as potential cancer risks for family members.   Ms. Vaeth is a 42 y.o. female with no personal history of cancer. She underwent genetic testing after her mother had been identified with a PALB2 mutation.   CANCER HISTORY:   No history exists.     HORMONAL RISK FACTORS:  Menarche was at age 36-10.  First live birth at age 52.  OCP use for approximately 0 years.  Ovaries intact: yes.  Hysterectomy: no.  Menopausal status: premenopausal.  HRT use: 0 years. Colonoscopy: yes; IBS. Mammogram within the last year: yes. Number of breast biopsies: 0. Up to date with pelvic exams:  yes. Any excessive radiation exposure in the past:  no  Past Medical History:  Diagnosis Date  . Carpal tunnel syndrome    right  . 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 Family history of breast cancer   . Family history of prostate cancer   . GERD (gastroesophageal reflux disease)   . Headache(784.0)    migraines or sinus  . History of vertigo    states comes and goes; no current med.  . Obesity   . Osteoarthritis   . PONV (postoperative nausea and vomiting)   . Rash 08/10/2013   right thigh  . Sleep apnea    does not wear cpap    Past Surgical History:  Procedure Laterality Date  . CARPAL TUNNEL RELEASE Left 08/18/2013   Procedure: LEFT CARPAL TUNNEL RELEASE;  Surgeon: Tennis Must, MD;   Location: [facility];  Service: Orthopedics;  Laterality: Left;  . CARPAL TUNNEL RELEASE Right 04/29/2017   Procedure: RIGHT CARPAL TUNNEL RELEASE;  Surgeon: Leanora Cover, MD;  Location: [facility];  Service: Orthopedics;  Laterality: Right;  . CESAREAN SECTION    . CHOLECYSTECTOMY    . HERNIA REPAIR     x 2  . HYSTEROPLASTY REPAIR OF UTERINE ANOMALY    . HYSTEROSCOPY W/D&C  01/13/2012   Procedure: DILATATION AND CURETTAGE /HYSTEROSCOPY;  Surgeon: Osborne Oman, MD;  Location: [facility];  Service: Gynecology;  Laterality: N/A;  Pap smear  . [place] RESECTION  2016  . TOE SURGERY Left    second toe. rod placed  . WISDOM TOOTH EXTRACTION  11/20/10    Social History   Socioeconomic History  . Marital status: Single    Spouse name: Not on file  . Number of children: Not on file  . Years of education: Not on file  . Highest education level: Not on file  Occupational History  . Not on file  Social Needs  . Financial resource strain: Not on file  . Food insecurity:    Worry: Not on file    Inability: Not on file  . Transportation needs:    Medical: Not on file    Non-medical: Not on file  Tobacco  Use  . Smoking status: Former Smoker    Years: 0.00    Last attempt to quit: 02/22/2009    Years since quitting: 8.5  . Smokeless tobacco: Never Used  Substance and Sexual Activity  . Alcohol use: No  . Drug use: No  . Sexual activity: Not Currently    Birth control/protection: Implant    Comment: never' \smoke'  routinue bases  Lifestyle  . Physical activity:    Days per week: Not on file    Minutes per session: Not on file  . Stress: Not on file  Relationships  . Social connections:    Talks on phone: Not on file    Gets together: Not on file    Attends religious service: Not on file    Active member of club or organization: Not on file    Attends meetings of clubs or organizations: Not on file    Relationship status: Not on file  Other Topics Concern  . Not on file   Social History Narrative  . Not on file     FAMILY HISTORY:  We obtained a detailed, 4-generation family history.  Significant diagnoses are listed below: Family History  Problem Relation Age of Onset  . Hypertension Mother   . Breast cancer Mother   . Hypertension Father   . Diabetes Maternal Grandmother   . Diabetes Paternal Grandmother     The patient has a maternal half brother who is cancer free and three paternal half siblings, two sisters and a brother, who are cancer free.  Both parents are living.   The patient's father has COPD.  He had approximately 10 siblings, one had lung cancer.  The remainder have heart disease and/or diabetes.  There are no other reports of cancer on the paternal side of the family.  The patient's mother was diagnosed with breast cancer at 29.  She was identified with a PALB2 mutation.  She has 12 siblings, two sisters had breast cancer, one at 75 and the other at 27, and one brother had prostate cancer.  Several siblings have undergone genetic testing for the familial PALB2 mutation.  The maternal grandparents are deceased.  The grandfather had renal cancer.  Patient's maternal ancestors are of African American descent, and paternal ancestors are of African American descent. There is no reported Ashkenazi Jewish ancestry. There is no known consanguinity.  GENETIC COUNSELING ASSESSMENT: MCKAYLAH BETTENDORF is a 42 y.o. 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 Ms. Pond underwent genetic testing and came in for results disclosure.   GENETIC TESTING: At the time of Ms. Seiden's visit, we recommended she pursue genetic testing for the specific PALB2 mutation that was identified in the family. 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     Clinical condition The risk of breast cancer in women with a single pathogenic PALB2 variant is 33-58% by age 42, with higher risks among those with a greater number of relatives with breast cancer (PMID: 01751025, 85277824, 23536144, 31540086). One study found the risk of developing contralateral breast cancer is approximately 10% within five years after the initial diagnosis of breast cancer among individuals with a pathogenic variant in [place] (PMID: 76195093).  For both men and women, there is also an increased  risk for pancreatic cancer, however, specific risk figures are not yet established (PMID: 23300762, 26333545, 62563893). Additional data suggests an increased risk of ovarian cancer (PMID: 73428768, 11572620) and female breast cancer (PMID: 35597416, 38453646, 80321224), although this evidence is limited and emerging.  Gene information PALB2 is a tumor-suppressor gene, meaning its function is to help control the rate of growth and cell division in the body. The protein product plays a critical role in homologous recombination repair (HRR) through its ability to recruit BRCA2 and RAD51 to DNA breaks (Uniprot: [email or web address]_HUMAN,Q86YC2 [email or web address]. Accessed January 2017). If there is a pathogenic variant in this gene that prevents it from functioning normally, the risk of developing certain types of cancers may be increased.  Inheritance Hereditary predisposition to cancer due to pathogenic variants in the PALB2 gene has autosomal dominant inheritance. This means that an individual with a pathogenic variant has a 50% chance of passing the condition on to their offspring. Once a pathogenic mutation is detected in an individual, it is possible to identify at-risk relatives who can pursue testing for this specific familial variant. Many cases are inherited from a parent, but some cases may occur spontaneously  (i.e., an individual with a pathogenic variant who has parents who do not have it).  Individuals with a single pathogenic PALB2 variant are also carriers of autosomal recessive Fanconi anemia type N. Fanconi anemia is characterized by bone marrow failure with variable additional anomalies, which often include short stature, abnormal skin pigmentation, abnormal thumbs, malformations of the skeletal and central nervous systems, and developmental delay (PMID: 8250037, 04888916). Risk of leukemia and early onset solid tumors is significantly elevated with this disorder (PMID: 94503888, 28003491, 79150569). For there to be a risk of Fanconi anemia in offspring, both the patient and their partner would each have to carry a pathogenic variant in [place]; in this case, the risk to have an affected child is 25%.  MEDICAL MANAGEMENT: The [place] (NCCN) has published screening and surveillance guidelines for women with a single pathogenic variant in [place] (NCCN. Genetic/Familial High-Risk Assessment: Breast and Ovarian. Version 1.2018):  - Annual mammography with consideration of tomosynthesis beginning at age 18 . Consider annual breast MRI with contrast starting at age 69, with modification as appropriate based on family history . Prophylactic risk-reducing mastectomy: consider based on family history (evidence insufficient; manage based on family history)  - NCCN cites insufficient evidence to warrant screening for ovarian and pancreatic cancer (NCCN. Genetic/Familial High-Risk Assessment: Breast and Ovarian. Version 3.2019). In contrast, the [employer] of Gastroenterology Clinical Guidelines recommend pancreatic cancer screening in PALB2 carriers be limited to those with a first- or second-degree relative affected with pancreatic cancer. Ideally, screening should be performed in experienced centers utilizing a multidisciplinary approach under research conditions. Recommended  screening includes annual endoscopic ultrasound and/or MRI of the pancreas starting at age 4 or 73 years younger than the earliest age of pancreatic cancer diagnosis in the family (PMID: 79480165).  Overall cancer risk assessment incorporates additional factors including personal medical history, family history, and any available genetic information that may result in a personalized plan for cancer prevention and surveillance.  The patient was referred to the high risk clinic at [facility] in order to consult with providers about issues surrounding tamoxifen, and high risk screening.  FAMILY MEMBERS: It is important that all of Ms. Spang's relatives (both men and women) know of the presence of this gene mutation. Site-specific genetic testing can sort out who in  the family is at risk and who is not. Knowing if a PALB2 pathogenic variant is present is advantageous. At-risk relatives can be identified, enabling pursuit of a diagnostic evaluation. Further, the available information regarding hereditary cancer susceptibility genes is constantly evolving and more clinically relevant data regarding PALB2 are likely to become available in the near future. Awareness of this cancer predisposition encourages patients and their providers to inform at-risk family members, to diligently follow recommended screening protocols, and to be vigilant in maintaining close and regular contact with their local genetics clinic in anticipation of new information.  Ms. Dundon children are have a 50% chance to have inherited this mutation. However, they are relatively young and this will not be of any consequence to them for several years. We do not test children because there is no risk to them until they are adults. We recommend they have genetic counseling and testing by the time they are in their early 20s.    Ms. Thayne siblings have a 50% chance to have inherited this mutation. We recommend they have genetic  testing for this same mutation, as identifying the presence of this mutation would allow them to also take advantage of risk-reducing measures.   SUPPORT AND RESOURCES: If Ms. Biggers is interested in PALB2-information and support, there are two groups, Facing Our Risk (www.facingourrisk.com) and Bright Pink (www.brightpink.org) which some people have found useful. They provide opportunities to speak with other individuals from high-risk families. To locate genetic counselors in other cities, visit the website of the [employer] (ArtistMovie.se) and Secretary/administrator for a Social worker by zip code.  She was also given information on the [facility] cancer registry out of [employer].  This study is interested in families with PALB2, and therefore it may interest Ms. Creswell to participate.  We encouraged Ms. Crepeau to remain in contact with [place] on an annual basis so we can update her personal and family histories, and let her know of advances in cancer genetics that may benefit the family. Our contact number was provided. Ms. Bordas questions were answered to her satisfaction today, and she knows she is welcome to call anytime with additional questions.   Ailen Strauch P. [place]ne [place], [place], [facility] Certified Genetic Counselor [email or web address] phone: [phone]

## 2017-09-13 ENCOUNTER — Encounter: Payer: Medicaid Other | Admitting: Genetic Counselor

## 2017-09-17 ENCOUNTER — Telehealth: Payer: Self-pay | Admitting: Genetic Counselor

## 2017-09-17 ENCOUNTER — Telehealth: Payer: Self-pay | Admitting: Hematology and Oncology

## 2017-09-17 NOTE — Telephone Encounter (Signed)
Lft the pt a vm to schedule in the high risk breast clinic

## 2017-09-17 NOTE — Telephone Encounter (Signed)
Pt has been scheduled to see Dr. Pamelia HoitGudena for the high risk clinic on 8/7 at 345pm. Pt agreed to the appt date and time.

## 2017-09-29 ENCOUNTER — Inpatient Hospital Stay: Payer: Medicaid Other | Admitting: Hematology and Oncology

## 2017-09-29 ENCOUNTER — Telehealth: Payer: Self-pay | Admitting: Hematology and Oncology

## 2017-09-29 NOTE — Telephone Encounter (Signed)
Pt cld to reschedule appt with Dr. Pamelia HoitGudena. Pt has been scheduled to see Dr. Pamelia HoitGudena on 8/22 at 215pm. Pt aware to arrive 30 minutes early.Pt was unable to come to the appts that I offered, but agreed to the new appt date and time.

## 2017-10-14 ENCOUNTER — Inpatient Hospital Stay: Payer: Medicaid Other | Attending: Genetic Counselor | Admitting: Hematology and Oncology

## 2017-10-14 DIAGNOSIS — Z1589 Genetic susceptibility to other disease: Secondary | ICD-10-CM

## 2017-10-14 DIAGNOSIS — Z1501 Genetic susceptibility to malignant neoplasm of breast: Secondary | ICD-10-CM | POA: Insufficient documentation

## 2017-10-14 DIAGNOSIS — Z1509 Genetic susceptibility to other malignant neoplasm: Secondary | ICD-10-CM

## 2017-10-14 NOTE — Assessment & Plan Note (Deleted)
Underwent genetic testing after her mother was diagnosed with PALB 2 mutation.  PALB2 (partner and localizer of BRCA2):  It is a gene that functions in DNA double strand brake repair similar to BRCA gene. It encodes a protein that functions in genome maintenance.  Clinical significance: 1.  Risk of breast cancer:The risk of breast cancer for female PALB2 mutation carriers, as compared with the general population, with 8-9 times as high among those younger than 42 years of age, 6-8 times as high among those 79-44 years of age, and 5 times as high among those older than 42 years of age. The cumulative breast cancer risk among female PALB2 mutation carriers is estimated to be increased by 2-4 fold by age 42, which translates to an 18-35% risk of breast cancer by age 58 based on a general female population risk of 8.8% to age 61.  2.  Risk of pancreatic cancer: The exact cumulative pancreatic cancer risk among PALB2 mutation carriers has not been determined. The magnitude of risk increases with the number of affected relatives, with the highest risk (32-fold) in individuals with three affected first-degree relatives.  3. Risk of Ovarian cancer:  Also not properly documented how much the increased risk is.   Recommendations: 1.  Consideration for prophylactic bilateral mastectomy 2.  Monitoring of symptoms with pancreatic cancer. There is no clear role of routine scans are CA 19-9 for surveillance 3.  Gynecologist evaluation  and counseling regarding risk of ovarian cancer.

## 2017-10-21 ENCOUNTER — Telehealth: Payer: Self-pay | Admitting: Hematology and Oncology

## 2017-10-21 NOTE — Telephone Encounter (Signed)
I attempted to call the pt and reschedule the appt but her vm is full.

## 2017-11-07 NOTE — Assessment & Plan Note (Signed)
PALB2 (partner and localizer of BRCA2):  It is a gene that functions in DNA double strand brake repair similar to BRCA gene. It encodes a protein that functions in genome maintenance.  Clinical significance: 1.  Risk of breast cancer:The risk of breast cancer for female PALB2 mutation carriers, as compared with the general population, with 8-9 times as high among those younger than 42 years of age, 6-8 times as high among those 21-28 years of age, and 5 times as high among those older than 42 years of age. The cumulative breast cancer risk among female PALB2 mutation carriers is estimated to be increased by 2-4 fold by age 29, which translates to an 18-35% risk of breast cancer by age 38 based on a general female population risk of 8.8% to age 36.  2.  Risk of pancreatic cancer: The exact cumulative pancreatic cancer risk among PALB2 mutation carriers has not been determined. The magnitude of risk increases with the number of affected relatives, with the highest risk (32-fold) in individuals with three affected first-degree relatives.  3. Risk of Ovarian cancer:  Also not properly documented how much the increased risk is.   Recommendations: 1.  Consideration for prophylactic bilateral mastectomy 2.  Monitoring of symptoms with pancreatic cancer. There is no clear role of routine scans are CA 19-9 for surveillance 3.  Gynecologist evaluation  and counseling regarding risk of ovarian cancer.  Men with PALB2  Mutations moderate risk for breast cancer and prostate cancer as well.

## 2017-11-08 ENCOUNTER — Telehealth: Payer: Self-pay | Admitting: Hematology and Oncology

## 2017-11-08 ENCOUNTER — Inpatient Hospital Stay: Payer: Medicaid Other | Attending: Genetic Counselor | Admitting: Hematology and Oncology

## 2017-11-08 DIAGNOSIS — Z87891 Personal history of nicotine dependence: Secondary | ICD-10-CM | POA: Diagnosis not present

## 2017-11-08 DIAGNOSIS — Z9884 Bariatric surgery status: Secondary | ICD-10-CM

## 2017-11-08 DIAGNOSIS — Z1589 Genetic susceptibility to other disease: Secondary | ICD-10-CM

## 2017-11-08 DIAGNOSIS — Z148 Genetic carrier of other disease: Secondary | ICD-10-CM | POA: Diagnosis not present

## 2017-11-08 DIAGNOSIS — Z1501 Genetic susceptibility to malignant neoplasm of breast: Secondary | ICD-10-CM

## 2017-11-08 DIAGNOSIS — Z1502 Genetic susceptibility to malignant neoplasm of ovary: Secondary | ICD-10-CM | POA: Diagnosis not present

## 2017-11-08 DIAGNOSIS — Z1509 Genetic susceptibility to other malignant neoplasm: Secondary | ICD-10-CM

## 2017-11-08 DIAGNOSIS — Z803 Family history of malignant neoplasm of breast: Secondary | ICD-10-CM | POA: Diagnosis not present

## 2017-11-08 DIAGNOSIS — Z1231 Encounter for screening mammogram for malignant neoplasm of breast: Secondary | ICD-10-CM

## 2017-11-08 NOTE — Progress Notes (Signed)
[place] CONSULT NOTE  Patient Care Team: Molt, Romelle Starcher, DO as PCP - General (Internal Medicine)  CHIEF COMPLAINTS/PURPOSE OF CONSULTATION:  PALB2 mutation  HISTORY OF PRESENTING ILLNESS:  Dawn Galvan 42 y.o. female is here because of recent diagnosis of PALB2 mutation.  Her mother was diagnosed with breast cancer related to PALB 2 mutation.  Because of this she also underwent genetic testing and it came back positive for PALB 2 mutation.  Because of this she was referred to our clinic for discussion regarding risk reduction options as well as surveillance for breast cancer.  She had a recent mammogram which was negative.  She has a son and her daughter aged 35 and 39. Patient had a gastric sleeve surgery and had lost over 100 pounds.  She is still trying to lose more weight. I reviewed her records extensively and collaborated the history with the patient.   MEDICAL HISTORY:  Past Medical History:  Diagnosis Date  . Carpal tunnel syndrome    right  . 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 Family history of breast cancer   . Family history of prostate cancer   . GERD (gastroesophageal reflux disease)   . Headache(784.0)    migraines or sinus  . History of vertigo    states comes and goes; no current med.  . Obesity   . Osteoarthritis   . PONV (postoperative nausea and vomiting)   . Rash 08/10/2013   right thigh  . Sleep apnea    does not wear cpap    SURGICAL HISTORY: Past Surgical History:  Procedure Laterality Date  . CARPAL TUNNEL RELEASE Left 08/18/2013   Procedure: LEFT CARPAL TUNNEL RELEASE;  Surgeon: Tennis Must, MD;  Location: [facility];  Service: Orthopedics;  Laterality: Left;  . CARPAL TUNNEL RELEASE Right 04/29/2017   Procedure: RIGHT CARPAL TUNNEL RELEASE;  Surgeon: Leanora Cover, MD;  Location: [facility];  Service: Orthopedics;  Laterality: Right;  . CESAREAN SECTION    . CHOLECYSTECTOMY    . HERNIA REPAIR     x 2  . HYSTEROPLASTY REPAIR OF  UTERINE ANOMALY    . HYSTEROSCOPY W/D&C  01/13/2012   Procedure: DILATATION AND CURETTAGE /HYSTEROSCOPY;  Surgeon: Osborne Oman, MD;  Location: [facility];  Service: Gynecology;  Laterality: N/A;  Pap smear  . [place] RESECTION  2016  . TOE SURGERY Left    second toe. rod placed  . WISDOM TOOTH EXTRACTION  11/20/10    SOCIAL HISTORY: Social History   Socioeconomic History  . Marital status: Single    Spouse name: Not on file  . Number of children: Not on file  . Years of education: Not on file  . Highest education level: Not on file  Occupational History  . Not on file  Social Needs  . Financial resource strain: Not on file  . Food insecurity:    Worry: Not on file    Inability: Not on file  . Transportation needs:    Medical: Not on file    Non-medical: Not on file  Tobacco Use  . Smoking status: Former Smoker    Years: 0.00    Last attempt to quit: 02/22/2009    Years since quitting: 8.7  . Smokeless tobacco: Never Used  Substance and Sexual Activity  . Alcohol use: No  . Drug use: No  . Sexual activity: Not Currently    Birth control/protection: Implant    Comment: never \smoke routinue  bases  Lifestyle  . Physical activity:    Days per week: Not on file    Minutes per session: Not on file  . Stress: Not on file  Relationships  . Social connections:    Talks on phone: Not on file    Gets together: Not on file    Attends religious service: Not on file    Active member of club or organization: Not on file    Attends meetings of clubs or organizations: Not on file    Relationship status: Not on file  . Intimate partner violence:    Fear of current or ex partner: Not on file    Emotionally abused: Not on file    Physically abused: Not on file    Forced sexual activity: Not on file  Other Topics Concern  . Not on file  Social History Narrative  . Not on file    FAMILY HISTORY: Family History  Problem Relation Age of Onset  .  Hypertension Mother   . Breast cancer Mother   . Hypertension Father   . Diabetes Maternal Grandmother   . Diabetes Paternal Grandmother     ALLERGIES:  has No Known Allergies.  MEDICATIONS:  Current Outpatient Medications  Medication Sig Dispense Refill  . fluconazole (DIFLUCAN) 150 MG tablet Take 1 tablet (150 mg total) by mouth daily. 1 tablet 0  . HYDROcodone-acetaminophen (NORCO) 5-325 MG tablet 1-2 tabs po q6 hours prn pain 20 tablet 0  . meloxicam (MOBIC) 15 MG tablet TAKE 1 TABLET BY MOUTH EVERY DAY 30 tablet 0  . meloxicam (MOBIC) 15 MG tablet Take 1 tablet (15 mg total) by mouth daily. 30 tablet 2  . Multiple Vitamin (MULTI-VITAMINS) TABS Take 3 tablets by mouth daily.     . Probiotic Product (PROBIOTIC DAILY PO) Take 1 capsule by mouth daily.    . sertraline (ZOLOFT) 50 MG tablet TAKE 1 TABLET BY MOUTH EVERY DAY 30 tablet 2   No current facility-administered medications for this visit.     REVIEW OF SYSTEMS:   Constitutional: Denies fevers, chills or abnormal night sweats Eyes: Denies blurriness of vision, double vision or watery eyes Ears, nose, mouth, throat, and face: Denies mucositis or sore throat Respiratory: Denies cough, dyspnea or wheezes Cardiovascular: Denies palpitation, chest discomfort or lower extremity swelling Gastrointestinal:  Denies nausea, heartburn or change in bowel habits Skin: Denies abnormal skin rashes Lymphatics: Denies new lymphadenopathy or easy bruising Neurological:Denies numbness, tingling or new weaknesses Behavioral/Psych: Mood is stable, no new changes  Breast:  Denies any palpable lumps or discharge All other systems were reviewed with the patient and are negative.  PHYSICAL EXAMINATION: ECOG PERFORMANCE STATUS: 0 - Asymptomatic  Vitals:   11/08/17 1255  BP: 136/85  Pulse: 87  Resp: 20  Temp: 98 F (36.7 C)  SpO2: 95%   Filed Weights   11/08/17 1255  Weight: (!) 332 lb 12.8 oz (151 kg)    GENERAL:alert, no distress  and comfortable SKIN: skin color, texture, turgor are normal, no rashes or significant lesions EYES: normal, conjunctiva are pink and non-injected, sclera clear OROPHARYNX:no exudate, no erythema and lips, buccal mucosa, and tongue normal  NECK: supple, thyroid normal size, non-tender, without nodularity LYMPH:  no palpable lymphadenopathy in the cervical, axillary or inguinal LUNGS: clear to auscultation and percussion with normal breathing effort HEART: regular rate & rhythm and no murmurs and no lower extremity edema ABDOMEN:abdomen soft, non-tender and normal bowel sounds Musculoskeletal:no cyanosis of digits and no  clubbing  PSYCH: alert & oriented x 3 with fluent speech NEURO: no focal motor/sensory deficits BREAST: No palpable nodules in breast. No palpable axillary or supraclavicular lymphadenopathy (exam performed in the presence of a chaperone)   LABORATORY DATA:  I have reviewed the data as listed Lab Results  Component Value Date   WBC 11.6 (H) 04/29/2017   HGB 12.9 04/29/2017   HCT 40.1 04/29/2017   MCV 89.1 04/29/2017   PLT 319 04/29/2017   Lab Results  Component Value Date   NA 143 02/28/2016   K 4.3 02/28/2016   CL 104 02/28/2016   CO2 22 02/28/2016    RADIOGRAPHIC STUDIES: I have personally reviewed the radiological reports and agreed with the findings in the report.  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 It encodes a protein that functions in genome maintenance.  Clinical significance: 1.  Risk of breast cancer:The risk of breast cancer for female PALB2 mutation carriers, as compared with the general population, with 8-9 times as high among those younger than 42 years of age, 6-8 times as high among those 26-44 years of age, and 5 times as high among those older than 42 years of age. The cumulative breast cancer risk  among female PALB2 mutation carriers is estimated to be increased by 2-4 fold by age 80, which translates to an 18-35% risk of breast cancer by age 62 based on a general female population risk of 8.8% to age 22.  2.  Risk of pancreatic cancer: The exact cumulative pancreatic cancer risk among PALB2 mutation carriers has not been determined. The magnitude of risk increases with the number of affected relatives, with the highest risk (32-fold) in individuals with three affected first-degree relatives.  3. Risk of Ovarian cancer:  Also not properly documented how much the increased risk is.   Recommendations: 1.  Consideration for prophylactic bilateral mastectomy versus surveillance with annual mammograms and breast MRI.  Patient preferred to undergo MRI surveillance.  I will set her up for a breast MRI in December. 2.  Monitoring of symptoms with pancreatic cancer. There is no clear role of routine scans are CA 19-9 for surveillance 3.  Gynecologist evaluation  and counseling regarding risk of ovarian cancer.  Men with PALB2  Mutations moderate risk for breast cancer and prostate cancer as well. Return to clinic in 1 year for follow-up.  I will call her with results of the breast MRI.  All questions were answered. The patient knows to call the clinic with any problems, questions or concerns.    Harriette Ohara, MD 11/08/17

## 2017-11-08 NOTE — Telephone Encounter (Signed)
Gave patient avs report and appointments for September 2020. Patient provided with information to contact [facility] to arrange breast mri.

## 2017-11-14 ENCOUNTER — Encounter (HOSPITAL_COMMUNITY): Payer: Self-pay | Admitting: Obstetrics and Gynecology

## 2017-11-14 ENCOUNTER — Telehealth: Payer: Self-pay | Admitting: Internal Medicine

## 2017-11-14 ENCOUNTER — Other Ambulatory Visit: Payer: Self-pay

## 2017-11-14 ENCOUNTER — Other Ambulatory Visit: Payer: Self-pay | Admitting: Internal Medicine

## 2017-11-14 ENCOUNTER — Emergency Department (HOSPITAL_COMMUNITY)
Admission: EM | Admit: 2017-11-14 | Discharge: 2017-11-14 | Discharge status: Against Medical Advice | Payer: Medicaid Other | Attending: Emergency Medicine | Admitting: Emergency Medicine

## 2017-11-14 DIAGNOSIS — R300 Dysuria: Secondary | ICD-10-CM | POA: Diagnosis present

## 2017-11-14 DIAGNOSIS — Z5321 Procedure and treatment not carried out due to patient leaving prior to being seen by health care provider: Secondary | ICD-10-CM | POA: Diagnosis not present

## 2017-11-14 LAB — URINALYSIS, ROUTINE W REFLEX MICROSCOPIC
Bilirubin Urine: NEGATIVE
Glucose, UA: NEGATIVE mg/dL
HGB URINE DIPSTICK: NEGATIVE
Ketones, ur: 5 mg/dL — AB
Nitrite: NEGATIVE
Protein, ur: NEGATIVE mg/dL
Specific Gravity, Urine: 1.023 (ref 1.005–1.030)
pH: 5 (ref 5.0–8.0)

## 2017-11-14 LAB — POC URINE PREG, ED: Preg Test, Ur: NEGATIVE

## 2017-11-14 MED ORDER — NITROFURANTOIN MONOHYD MACRO 100 MG PO CAPS: 100.0000 mg | ORAL_CAPSULE | Freq: Two times a day (BID) | ORAL | 0 refills | Status: AC

## 2017-11-14 MED ORDER — FLUCONAZOLE 150 MG PO TABS: 150.0000 mg | ORAL_TABLET | Freq: Once | ORAL | 0 refills | Status: AC

## 2017-11-14 NOTE — ED Notes (Signed)
Pt called for room, no response from lobby 

## 2017-11-14 NOTE — Telephone Encounter (Signed)
   Reason for call:   I received a call from Ms. Arma Headingana C Funari at 7:37 PM asking for urinalysis results obtained earlier today during an ED visit.   Pertinent Data:   [name] has been experiencing pain and burning with urination as well as urinary frequency for 2 days. States her symptoms are consistent with previous UTIs in the past. She went to the ED earlier today for further evaluation where a urinalysis showed leukocytes and rare bacteria without nitrites. Urine pregnancy test was negative. Discussed results with patient.   She also reports having unprotected sexual intercourse recently and is complaining of vaginal pain and itching. She denies any lesions/ulcers, vaginal discharge and bleeding.    Assessment / Plan / Recommendations:   Symptoms consistent with acute uncomplicated cystitis. Sent a prescription for nitrofurantoin 100 mg BID x 5 days followed by a one time dose of diflucan 150 mg as patient stated she frequently gets vaginal yeast infections after a course of antibiotics.   Advised patient to schedule an appointment in clinic for STI testing. Counseled on safe sexual intercourse.   As always, pt is advised that if symptoms worsen or new symptoms arise, they should go to an urgent care facility or to to ER for further evaluation.   Burna CashSantos-Sanchez, Ariatna Jester, MD   11/14/2017, 7:56 PM

## 2017-11-14 NOTE — ED Triage Notes (Signed)
Pt reports she has been having painful and frequent urination. Pt reports she has also had some vaginal pain and itching. Pt reports it started about 2 weeks ago.  Pt reports she has been having unprotected sex.  Pt denies pregnancy.  Pt reports she has an odor to the area.

## 2017-11-20 ENCOUNTER — Telehealth: Payer: Self-pay | Admitting: Internal Medicine

## 2017-11-20 DIAGNOSIS — N760 Acute vaginitis: Secondary | ICD-10-CM

## 2017-11-20 MED ORDER — FLUCONAZOLE 150 MG PO TABS: ORAL_TABLET | ORAL | 0 refills | Status: DC

## 2017-11-20 NOTE — Telephone Encounter (Signed)
   Reason for call:   I received a call from [name] at 1702 hours indicating that she was still experiencing persistent vulvovaginitis.   Pertinent Data:   The patient had recently contacted the IMTS team on 09/22 or UTI symptoms and had requested treatment for an impending vulvovaginitis which she states always occurs with antibiotic treatment. As such a one time dose of diflucan was provided. The patient stated that this initially resolved her resulting irritation but ultimately did not entirely resolve. As such, she is requesting a refill for a two tablet course of diflucan.  She is concerned for pruritis of the vaginal area, irritation, burning externally with urination and cleaning of the area. She has not attempt over the counter treatment stating that those only worsening the pain.    Assessment / Plan / Recommendations:   This appears consistent with vulvovaginitis given her history and symptoms. I advised her to come to the clinic on Monday. She stated that this would leave her in pain until then. We decided to refill the diflucan x 2 doses and to have her arrange an appointment for out clinic. I advised her further that a third refill without being seen would likely not be possible.    As always, pt is advised that if symptoms worsen or new symptoms arise, they should go to an urgent care facility or to to ER for further evaluation.   Lanelle Bal, MD   11/20/2017, 5:06 PM

## 2017-12-09 ENCOUNTER — Ambulatory Visit
Admission: RE | Admit: 2017-12-09 | Discharge: 2017-12-09 | Disposition: A | Discharge status: Home / Self Care | Payer: Medicaid Other | Source: Ambulatory Visit | Attending: Hematology and Oncology | Admitting: Hematology and Oncology

## 2017-12-09 ENCOUNTER — Telehealth: Payer: Self-pay | Admitting: Hematology and Oncology

## 2017-12-09 DIAGNOSIS — Z1231 Encounter for screening mammogram for malignant neoplasm of breast: Secondary | ICD-10-CM

## 2017-12-09 DIAGNOSIS — Z803 Family history of malignant neoplasm of breast: Secondary | ICD-10-CM | POA: Diagnosis not present

## 2017-12-09 MED ORDER — GADOBENATE DIMEGLUMINE 529 MG/ML IV SOLN
20.0000 mL | Freq: Once | INTRAVENOUS | Status: AC | PRN
  Administered 2017-12-09: 20 mL via INTRAVENOUS

## 2017-12-09 NOTE — Telephone Encounter (Signed)
I reviewed the breast MRI which showed right breast linear non-mass enhancement measuring 4.7 cm.  They are recommending an MRI guided biopsy.  I called and left a message for the patient that we will be setting her up for the biopsy.

## 2017-12-10 ENCOUNTER — Other Ambulatory Visit: Payer: Self-pay | Admitting: *Deleted

## 2017-12-10 ENCOUNTER — Other Ambulatory Visit: Payer: Self-pay | Admitting: Hematology and Oncology

## 2017-12-10 DIAGNOSIS — R928 Other abnormal and inconclusive findings on diagnostic imaging of breast: Secondary | ICD-10-CM

## 2017-12-20 ENCOUNTER — Telehealth: Payer: Self-pay | Admitting: *Deleted

## 2017-12-20 ENCOUNTER — Other Ambulatory Visit: Payer: Self-pay | Admitting: *Deleted

## 2017-12-20 ENCOUNTER — Ambulatory Visit
Admission: RE | Admit: 2017-12-20 | Discharge: 2017-12-20 | Disposition: A | Discharge status: Home / Self Care | Payer: Medicaid Other | Source: Ambulatory Visit | Attending: Hematology and Oncology | Admitting: Hematology and Oncology

## 2017-12-20 ENCOUNTER — Telehealth: Payer: Self-pay

## 2017-12-20 ENCOUNTER — Ambulatory Visit: Payer: Medicaid Other

## 2017-12-20 DIAGNOSIS — R928 Other abnormal and inconclusive findings on diagnostic imaging of breast: Secondary | ICD-10-CM

## 2017-12-20 MED ORDER — LORAZEPAM 1 MG PO TABS: ORAL_TABLET | ORAL | 0 refills | Status: DC

## 2017-12-20 NOTE — Telephone Encounter (Signed)
Lawson Fiscal, from [facility], called and left voicemail stating that patient was scheduled for breast biopsy however was unable to complete due to being claustrophobic and anxious.  Patient was rescheduled for 12/22/2017.  Will forward to provider for review and recommendations.

## 2017-12-20 NOTE — Telephone Encounter (Signed)
This RN spoke with pt this AM per her coming in to the office due to concerns relating to not being able to obtain MRI bx and clip placement. Pt's mother was with pt as well ( she is a pt of Dr Zebedee Iba with history of breast cancer)  Danijela states " it just felt different this time on the MRI machine - and then when I lifted my head and felt the machine I just began to panic "  This RN validated her experience.   Nura inquired if she could do the biopsy and clip placement under ultrasound instead of MRI ( this is what her mother had done ).  This RN reviewed pt's concerns - discussion focused on medically needing to use the best view for appropriate biopsy and placement of clip. Reviewed her MRI reading with her mother's history per permission of the mother. Reiterated that pt's presentation of abnormality is very different then her mother's - and again for best outcome - procedure by MRI is recommended.  This RN did inform pt her request will be discussed with the Breast Center as well as an anti anxiety medication for use with the MRI will be sent to her pharmacy.  Appointment obtained for MRI to be done 12/22/2017.  Per end of above interaction ( approximately 30 minutes ) pt felt comfortable with plan.  This RN contacted the Breast Center per above - information taken - will be reviewed by appropriate staff and call returned to this RN. Per discussion as well pt is had procedure done on the " open " MRI this morning and is rescheduled on the open MRI as well.  Wallis Bamberg from the Breast Center returned call and informed this RN that per review procedure needs to be performed by MRI.  This RN called pt's given number and obtained identified VM- message left per above.

## 2017-12-22 ENCOUNTER — Telehealth: Payer: Self-pay | Admitting: *Deleted

## 2017-12-22 ENCOUNTER — Other Ambulatory Visit: Payer: Self-pay | Admitting: *Deleted

## 2017-12-22 ENCOUNTER — Ambulatory Visit: Payer: Medicaid Other

## 2017-12-22 ENCOUNTER — Ambulatory Visit: Admission: RE | Admit: 2017-12-22 | Payer: Medicaid Other | Source: Ambulatory Visit

## 2017-12-22 NOTE — Telephone Encounter (Signed)
This RN spoke with [facility] and was informed next available to do an MRI guided biopsy for this pt would be 8 am on 11/13 - IV would need to be placed by this facility prior to procedure " but she has to be here by 8 for the procedure to be done" " If she takes anti anxiety medications she will need to have a driver "  This RN discussed above issue with Cancer Manager per need for IV to be placed in AM on 11/13 which is when the facility opens and nurse would need to be available earlier then 730 for pt to be able to get to [facility] by 8am.  Call then received from the Breast Center/[facility] stating pt has called them informing them to " cancel my appointment, I will not be coming back ".  This RN called Annabelle Harman and discussed the above - with [employer] stating " just forget this high risk thing, I can't go back to them, they treated me awful "  Starkisha stated " it started with my mammogram- that lady was frustrated and rude about how big my breast were and they were hard to get in the machine "  " then the issues I have had with trying to get this biopsy .Marland Kitchen..just forget that I need anything "  Pt began crying while in discussion.  This RN validated the patient's feelings and concerns mentioned related to how she felt her body shape was an issue. This RN also reassured pt that we cannot " forget " she is high risk with an abnormal MRI "- with goal of care to find out how we can best obtain a biopsy.  This RN informed pt if she does not want to return to [facility] then she may have to be referred to  [facility] or [facility] or possibly [facility].  This RN at end of conversation again validated pt's feelings and emotions regarding issues relating to her diagnosis.  Plan is for this RN to follow up with other facilities to obtain MRI guided biopsy.

## 2017-12-22 NOTE — Telephone Encounter (Signed)
This RN spoke with pt per her VM x 2 stating issues related to scheduled MRI of her breast this am.   Dawn Galvan states when she arrived for biopsy she was told due to not having a driver ( and she took ativan ) they would need to reschedule.  Dawn Galvan stated they could contact her mother who could pick her up.  She then stated the radiologist was " saying we need to reschedule even after I told them I could arrange a driver - even a tech came in and informed the radiologist that if needed they could contact [facility] for transportation home "  " then when they went to start my IV the nurse missed the first stick and then had someone else come in - she too couldn't get the IV "  " I was upset because they said they would need to reschedule me for 3 weeks but I wanted this biopsy today "  Dawn Galvan is inquiring if there is another facility that could do her biopsy.  This RN informed pt [facility] is the only facility that does MRI guided breast biopsies.  At present Dawn Galvan is at home and states she could come in later today if needed.  This RN informed her above concerns would be followed up to best accommodate her in need of biopsy ( failed attempt x 2 ).  This RN called MRI at [facility] to inquire if there was another facility within the [facility] network that could provide this service. Verified [facility] is the only facility that does the MRI guided biopsy of the breast.  This RN then called [facility] to follow up on appointment - spoke with Dawn Galvan who states she will follow up regarding appointment- she did state of known issues this morning - time was offered for next week with pt not able to do this time - plan per call is Dawn Galvan too will look into this situation - including pt's account that she may need to come to this office for IV start then come to [facility].  This RN's phone number given for contact per above.

## 2017-12-23 ENCOUNTER — Telehealth: Payer: Self-pay | Admitting: *Deleted

## 2017-12-23 NOTE — Telephone Encounter (Signed)
This RN verified that records for MRI guided biopsy was received by [facility] - per Elnita Maxwell at [facility] ( phone number [phone].)  Per Cheryl's VM on this RN's line - records received, actual films " pushed " from [facility] " and now under review by their radiologist.  Elnita Maxwell stated she will likely be able to call the pt tomorrow with appointment for biopsy.  This RN attempted to reach pt to discuss the above- obtained identified VM - message left informing her of above as well as to call this office with any questions. Also informed her once date of biopsy known refill for ativan will be sent to her pharmacy,

## 2017-12-27 ENCOUNTER — Telehealth: Payer: Self-pay | Admitting: Medical Oncology

## 2017-12-27 NOTE — Telephone Encounter (Signed)
Pt has not heard form [facility]. 'Its been 3 weeks since MRI"She said [facility] sent films. Note sent to May to f/u tomorrow.

## 2017-12-28 ENCOUNTER — Telehealth: Payer: Self-pay | Admitting: *Deleted

## 2017-12-28 NOTE — Telephone Encounter (Signed)
This RN called to [facility] to follow up on appointment for MRI guided biopsy.  Obtained VM for Dawn Galvan - message left with request to return call to this RN, direct desk number left for contact.  Called and informed the pt as well - of note she states she has called [facility] x 2 .  Plan is will contact pt as soon as call is returned.

## 2017-12-29 ENCOUNTER — Telehealth: Payer: Self-pay | Admitting: *Deleted

## 2017-12-29 NOTE — Telephone Encounter (Signed)
Per VM from [facility]- pt has been scheduled for MRI guided biopsy on 01/04/2018.  Requested order to be faxed per above - obtained and faxed to [phone].

## 2018-01-05 ENCOUNTER — Other Ambulatory Visit: Payer: Medicaid Other

## 2018-01-05 ENCOUNTER — Telehealth: Payer: Self-pay | Admitting: *Deleted

## 2018-01-05 NOTE — Telephone Encounter (Signed)
This RN spoke with pt per her return call - Dawn Galvan states she will proceed with plan for biopsy at [facility].  She states time is a stress " because I have kids I have to get ready for school - and my mom who can help will be my driver ( due to need to take antianxiety medications ).  This RN validated the patient's stress- and goal is to do MRI and biopsy and be successful to give her an answer.  This RN discussed currently concern is being able to start an IV- which is being requested to be done at this office- due to time of MRI - scheduling for IV start is still in process.  Dawn Galvan stated " [facility] said if I just drink a lot of water the night before - starting an IV should not be a problem "  Dawn Galvan also stated " couldn't they just do another MRI and see if the abnormality is still there ?"   This RN informed pt MRI is being performed and then biopsy only will be done when area noted is identified as on prior MRI.  Dawn Galvan verbalized understanding.  Per phone discussion this RN requested for pt to prepare for procedure as scheduled and this RN will contact pt later this week with plan for IV start.  Dawn Galvan verbalized understanding.

## 2018-01-05 NOTE — Telephone Encounter (Signed)
This RN spoke with pt per her return call - Dawn Galvan states she will proceed with plan for biopsy at [facility].  She states time is a stress " because I have kids I have to get ready for school - and my mom who can help will be my driver ( due to need to take antianxiety medications ).  This RN validated the patient's stress- and goal is to do MRI and biopsy and be successful to give her an answer.  This RN discussed currently concern is being able to start an IV- which is being requested to be done at this office- due to time of MRI - scheduling for IV start is still in process.  Dawn Galvan stated " [facility] said if I just drink a lot of water the night before - starting an IV should not be a problem "  Dawn Galvan also stated " couldn't they just do another MRI and see if the abnormality is still there ?"   This RN informed pt MRI is being performed and then biopsy only will be done when area noted is identified as on prior MRI.  Dawn Galvan verbalized understanding.  Per phone discussion this RN requested for pt to prepare for procedure as scheduled and this RN will contact pt later this week with plan for IV start.  Dawn Galvan verbalized understanding.   

## 2018-01-05 NOTE — Telephone Encounter (Signed)
This RN received VM on 01/04/2018 stating pt was a no show for scheduled MRI biopsy at [facility].  This RN attempted to call pt today ( no call from the patient yesterday ) and obtained identified VM.  Message left requesting a return call from pt to discuss appointments and plans for next week.  Note pt has been scheduled for MRI guided bx at [facility] 11/20 - with coordination for IV to be placed by this office if possible.

## 2018-01-10 ENCOUNTER — Encounter: Payer: Self-pay | Admitting: *Deleted

## 2018-01-11 ENCOUNTER — Telehealth: Payer: Self-pay | Admitting: *Deleted

## 2018-01-11 NOTE — Telephone Encounter (Signed)
This RN spoke with [facility] director today regarding time of biopsy and need for IV start. Per discussion - plan is due to time - IV will need to be started at [facility] ( this office does not open until 730 - and [facility] could not change time of biopsy due to " this is when our radiologist are here "  This RN informed director pt is aware she needs to be well hydrated over the evening, as well as use of ativan for claustrophobia and that she has a driver.  This RN also spoke with the patient per above with pt verbalizing understanding of plan.

## 2018-01-12 ENCOUNTER — Ambulatory Visit
Admission: RE | Admit: 2018-01-12 | Discharge: 2018-01-12 | Disposition: A | Discharge status: Home / Self Care | Payer: Medicaid Other | Source: Ambulatory Visit | Attending: Hematology and Oncology | Admitting: Hematology and Oncology

## 2018-01-12 DIAGNOSIS — R928 Other abnormal and inconclusive findings on diagnostic imaging of breast: Secondary | ICD-10-CM | POA: Diagnosis not present

## 2018-01-12 DIAGNOSIS — N6011 Diffuse cystic mastopathy of right breast: Secondary | ICD-10-CM | POA: Diagnosis not present

## 2018-01-12 MED ORDER — GADOBUTROL 1 MMOL/ML IV SOLN
10.0000 mL | Freq: Once | INTRAVENOUS | Status: AC | PRN
Start: 2018-01-12 — End: 2018-01-12
  Administered 2018-01-12: 10 mL via INTRAVENOUS

## 2018-02-03 ENCOUNTER — Telehealth: Payer: Self-pay | Admitting: *Deleted

## 2018-02-03 NOTE — Telephone Encounter (Signed)
This RN returned VM to pt per her VM stating she received a VM from Dr Harland GermanVG " but I don't want to talk with him, I want you to call me or Dr Darnelle CatalanMagrinat ( her mother's doctor ) and I want to go on that pill my mom is on "  Annabelle HarmanDana stated she didn't have the name of the pill " but I think it starts with an A "  This RN obtained identified VM- message left informing pt need for further discussion regarding above- and this RN can discuss with her - and if needed follow up will be made with Dr Darnelle CatalanMagrinat. Medication needs to be discussed because certain medication cannot be used in young women.  This RN requested a return call with hopes that we can speak person to person.

## 2018-02-11 ENCOUNTER — Telehealth: Payer: Self-pay | Admitting: *Deleted

## 2018-02-11 NOTE — Telephone Encounter (Signed)
This RN spoke with pt per her VM - and reviewed with her currently no follow up needed per biopsy- including presently no need for antiestrogens.  Presently pt needs to continue mammograms and MRI's for close surveillance.  Next appointment will be made with Dr Dawn Galvan per pt's request ( pt's mother is seeing Dr Dawn Galvan for a known history of breast cancer ).  Dawn HarmanDana verbalized understanding of above.  No further needs at this time.  Appointment request sent per above.

## 2018-02-14 ENCOUNTER — Telehealth: Payer: Self-pay | Admitting: Oncology

## 2018-02-14 NOTE — Telephone Encounter (Signed)
R/s appt per 12/20 sch message - left message for patient with appt date and time

## 2018-02-28 ENCOUNTER — Ambulatory Visit: Payer: Medicaid Other | Admitting: Internal Medicine

## 2018-02-28 ENCOUNTER — Encounter: Payer: Self-pay | Admitting: Internal Medicine

## 2018-02-28 ENCOUNTER — Other Ambulatory Visit: Payer: Self-pay

## 2018-02-28 VITALS — BP 126/86 | HR 90 | Temp 98.8°F | Wt 331.0 lb

## 2018-02-28 DIAGNOSIS — G8929 Other chronic pain: Secondary | ICD-10-CM

## 2018-02-28 DIAGNOSIS — M1990 Unspecified osteoarthritis, unspecified site: Secondary | ICD-10-CM | POA: Diagnosis not present

## 2018-02-28 DIAGNOSIS — E669 Obesity, unspecified: Secondary | ICD-10-CM | POA: Diagnosis not present

## 2018-02-28 DIAGNOSIS — Z6841 Body Mass Index (BMI) 40.0 and over, adult: Secondary | ICD-10-CM

## 2018-02-28 DIAGNOSIS — R350 Frequency of micturition: Secondary | ICD-10-CM

## 2018-02-28 DIAGNOSIS — M25551 Pain in right hip: Secondary | ICD-10-CM

## 2018-02-28 DIAGNOSIS — Z975 Presence of (intrauterine) contraceptive device: Secondary | ICD-10-CM | POA: Diagnosis not present

## 2018-02-28 LAB — POCT URINALYSIS DIPSTICK
BILIRUBIN UA: NEGATIVE
Glucose, UA: NEGATIVE
Nitrite, UA: NEGATIVE
PH UA: 5.5 (ref 5.0–8.0)
Protein, UA: POSITIVE — AB
Spec Grav, UA: >=1.03 — AB (ref 1.010–1.025)
Urobilinogen, UA: 2 E.U./dL — AB

## 2018-02-28 MED ORDER — SULINDAC 150 MG PO TABS: 150.0000 mg | ORAL_TABLET | Freq: Two times a day (BID) | ORAL | 1 refills | Status: AC

## 2018-02-28 NOTE — Patient Instructions (Signed)
Dawn Galvan, It was a pleasure meeting you!  Today we discussed your right hip pain. I am ordering an x-ray and starting a new medication that you will take twice daily. Please stop taking the Meloxicam for now, as this will take the place of that. You can also continue taking Tylenol throughout the day in between doses. I would try a heating pad or cool pack at night to see if it will help you sleep any better.  I am sending you urine off for analysis and will call you if you need antibiotics.  I will also place a consult for a nutritionist.   I'll see you back in 4-6 weeks to see how your hip is doing.   Thanks! Dr. Chesley Mires

## 2018-03-01 LAB — URINALYSIS, ROUTINE W REFLEX MICROSCOPIC
BILIRUBIN UA: NEGATIVE
GLUCOSE, UA: NEGATIVE
KETONES UA: NEGATIVE
NITRITE UA: NEGATIVE
RBC UA: NEGATIVE
UUROB: 1 mg/dL (ref 0.2–1.0)
pH, UA: 5.5 (ref 5.0–7.5)

## 2018-03-01 LAB — MICROSCOPIC EXAMINATION
Casts: NONE SEEN /lpf
RBC MICROSCOPIC, UA: NONE SEEN /HPF (ref 0–2)

## 2018-03-02 LAB — URINE CULTURE

## 2018-03-06 ENCOUNTER — Encounter: Payer: Self-pay | Admitting: Internal Medicine

## 2018-03-06 DIAGNOSIS — M25551 Pain in right hip: Secondary | ICD-10-CM

## 2018-03-06 DIAGNOSIS — G8929 Other chronic pain: Secondary | ICD-10-CM | POA: Insufficient documentation

## 2018-03-06 NOTE — Assessment & Plan Note (Signed)
What patient described as lower abdominal pain is actually right hip pain based on exam. Given her elevated BMI of 56, the most likely etiology is due to primary OA.  Have ordered plain film of hip to further evaluate. Will try different class of anti-inflammatory since she is currently on Mobic with minimal relief. Start Sulindac 150 mg BID.  I have placed a referral to a nutritionist per patient's request to help her with weight loss.  If her symptoms persist, will refer for physical therapy.

## 2018-03-06 NOTE — Progress Notes (Signed)
   CC: abdominal pain   HPI:  [name] is a 43 y.o. female with PMHx of obesity and OA who presents with 3 month history of right lower abdominal pain. The pain is described as a deep ache that is worse with lying down at night to sleep. She states the pain first began after she had intercourse. The pain is somewhat relieved with Tylenol and Meloxicam that she takes daily for tendonitis in her foot.  Of note, patient has an IUD and therefore does not have regular menstrual cycles. She denies any hematuria, vaginal discharge, n/v. Her symptoms are not associated with eating. She has not had any changes in bowel movements.    Past Medical History:  Diagnosis Date  . Carpal tunnel syndrome    right  . 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 Family history of breast cancer   . Family history of prostate cancer   . GERD (gastroesophageal reflux disease)   . Headache(784.0)    migraines or sinus  . History of vertigo    states comes and goes; no current med.  . Obesity   . Osteoarthritis   . PONV (postoperative nausea and vomiting)   . Rash 08/10/2013   right thigh  . Sleep apnea    does not wear cpap   Review of Systems:   General: no fevers, chills, weight loss CV: no chest pain, palpitations Pulm: no cough or shortness of breath Ext: no edema   Physical Exam:  Vitals:   02/28/18 1629  BP: 126/86  Pulse: 90  Temp: 98.8 F (37.1 C)  TempSrc: Oral  SpO2: 100%  Weight: (!) 331 lb (150.1 kg)   General: A&Ox3; obese female in NAD CV: RRR; no murmurs rubs or gallops Pulm: normal respiratory effort; lungs CTA bilaterally Abd: BS+; abdomen is obese, soft. There is no tenderness to superficial or deep palpation. No suprapubic tenderness MSK: R hip with TTP along anterior portion; significant pain with FADIR   Assessment & Plan:   See Encounters Tab for problem based charting.  Patient discussed with Dr. Josem Kaufmann

## 2018-03-07 NOTE — Progress Notes (Signed)
Case discussed with Dr. Chesley Mires at the time of the visit.  We reviewed the resident's history and exam and pertinent patient test results.  I agree with the assessment, diagnosis and plan of care documented in the resident's note.

## 2018-03-08 ENCOUNTER — Encounter: Payer: Self-pay | Admitting: Internal Medicine

## 2018-03-17 ENCOUNTER — Telehealth: Payer: Self-pay | Admitting: *Deleted

## 2018-03-17 DIAGNOSIS — G8929 Other chronic pain: Secondary | ICD-10-CM

## 2018-03-17 DIAGNOSIS — M25551 Pain in right hip: Principal | ICD-10-CM

## 2018-03-17 NOTE — Telephone Encounter (Signed)
Received call from [facility] Radiology dept regarding order for DG Arthro Hip Right.  If physician would like to order plain films, then this would be the incorrect order. Plain films can be ordered as DG HIP Unilateral with pelvis (1V, 2-3 Views, or min Views).  Will send to pcp to confirm request for plain films and to place new order if appropriate, please advise.Dawn Spittle Cassady1/23/202011:38 AM

## 2018-03-17 NOTE — Telephone Encounter (Signed)
Thanks, Purnell Shoemaker! Correct order should be placed.

## 2018-03-30 ENCOUNTER — Encounter: Payer: Self-pay | Admitting: Internal Medicine

## 2018-03-30 ENCOUNTER — Other Ambulatory Visit: Payer: Self-pay | Admitting: Internal Medicine

## 2018-04-07 ENCOUNTER — Encounter: Payer: Medicaid Other | Admitting: Dietician

## 2018-04-12 ENCOUNTER — Encounter: Payer: Medicaid Other | Admitting: Dietician

## 2018-04-22 DIAGNOSIS — R457 State of emotional shock and stress, unspecified: Secondary | ICD-10-CM | POA: Diagnosis not present

## 2018-04-22 DIAGNOSIS — R079 Chest pain, unspecified: Secondary | ICD-10-CM | POA: Diagnosis not present

## 2018-06-09 ENCOUNTER — Other Ambulatory Visit: Payer: Self-pay | Admitting: Oncology

## 2018-06-09 ENCOUNTER — Other Ambulatory Visit: Payer: Self-pay | Admitting: Hematology and Oncology

## 2018-06-09 DIAGNOSIS — Z1231 Encounter for screening mammogram for malignant neoplasm of breast: Secondary | ICD-10-CM

## 2018-08-09 ENCOUNTER — Other Ambulatory Visit: Payer: Self-pay

## 2018-08-09 ENCOUNTER — Ambulatory Visit
Admission: RE | Admit: 2018-08-09 | Discharge: 2018-08-09 | Disposition: A | Discharge status: Home / Self Care | Payer: Medicaid Other | Source: Ambulatory Visit | Attending: Hematology and Oncology | Admitting: Hematology and Oncology

## 2018-08-09 DIAGNOSIS — Z1231 Encounter for screening mammogram for malignant neoplasm of breast: Secondary | ICD-10-CM | POA: Diagnosis not present

## 2018-08-09 DIAGNOSIS — Z803 Family history of malignant neoplasm of breast: Secondary | ICD-10-CM | POA: Diagnosis not present

## 2018-08-17 ENCOUNTER — Other Ambulatory Visit: Payer: Self-pay | Admitting: Internal Medicine

## 2018-08-17 DIAGNOSIS — F3341 Major depressive disorder, recurrent, in partial remission: Secondary | ICD-10-CM

## 2018-08-30 DIAGNOSIS — Z9884 Bariatric surgery status: Secondary | ICD-10-CM | POA: Diagnosis not present

## 2018-09-12 ENCOUNTER — Telehealth: Payer: Self-pay | Admitting: *Deleted

## 2018-09-12 ENCOUNTER — Encounter: Payer: Medicaid Other | Admitting: Internal Medicine

## 2018-09-12 NOTE — Telephone Encounter (Signed)
Call made to patient at 1330 today to remind her of her 1545 appt today with Dr. Koleen Distance.  Pt states that she is "really tired' and that is it difficult for her to come in for appointments.  Pt was given the option to reschedule for a later date.  Appt given for 09/26/18 at 1545 with her pcp.  Pt was also instructed to obtain hip films (ordered at a previous visit) prior to visit to f/u her hip pain.  Pt informed that radiology dept opened from 7am to 5pm. Pt agreed.  No further action needed, phone call complete.Despina Hidden Cassady7/20/20203:47 PM

## 2018-09-26 ENCOUNTER — Encounter: Payer: Medicaid Other | Admitting: Internal Medicine

## 2018-09-27 ENCOUNTER — Encounter: Payer: Self-pay | Admitting: Oncology

## 2018-10-03 ENCOUNTER — Ambulatory Visit: Payer: Medicaid Other | Admitting: Oncology

## 2018-10-04 NOTE — Progress Notes (Signed)
[place]  [phone] [phone]    ID: [name] DOB: [birth year]  MR#: [id number]  PPJ#:[id number]  Patient Care Team: Delice Bison, DO as PCP - General (Internal Medicine) Donterius Filley, Virgie Dad, MD as Consulting Physician (Oncology) OTHER MD:   CHIEF COMPLAINT: PALB 2 Mutation  CURRENT TREATMENT: definitive surgery pending   HISTORY OF CURRENT ILLNESS: Dawn Galvan is a 44 y.o. 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 She was originally under the care of Dr. Lindi Adie, but the patient requested a second opinion. Dawn Galvan's mother, Dawn Galvan, was also diagnosed with breast cancer related to PALB 2 mutation.  Dawn Galvan opted for intensified screening but passed on antiestrogens. Because of this history, Dawn Galvan also underwent genetic testing and it returned positive for the PALB 2 mutation in July 2019.     INTERVAL HISTORY: Dawn Galvan was evaluated in the high risk clinic on 10/05/2018.    REVIEW OF SYSTEMS: Dawn Galvan notes that she is interested in bilateral mastectomies. She says that the anxiety surrounding breast cancer is extremely high.  She cannot handle it.  She has not seen a surgeon yet, but she notes that her mother saw Dr. Donne Hazel. The patient denies unusual headaches, visual changes, nausea, vomiting, stiff neck, dizziness, or gait imbalance. There has been no cough, phlegm production, or pleurisy, no chest pain or pressure, and no change in bowel or bladder habits. The patient denies fever, rash, bleeding, unexplained fatigue or unexplained weight loss. A detailed review of systems was otherwise entirely negative.   PAST MEDICAL HISTORY: Past Medical History:  Diagnosis Date  . Carpal tunnel syndrome    right  . 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 Family history of prostate cancer   . GERD (gastroesophageal reflux disease)   .  Headache(784.0)    migraines or sinus  . History of vertigo    states comes and goes; no current med.  . Obesity   . Osteoarthritis   . PONV (postoperative nausea and vomiting)   . Rash 08/10/2013   right thigh  . Sleep apnea    does not wear cpap     PAST SURGICAL HISTORY: Past Surgical History:  Procedure Laterality Date  . CARPAL TUNNEL RELEASE Left 08/18/2013   Procedure: LEFT CARPAL TUNNEL RELEASE;  Surgeon: Tennis Must, MD;  Location: [place];  Service: Orthopedics;  Laterality: Left;  . CARPAL TUNNEL RELEASE Right 04/29/2017   Procedure: RIGHT CARPAL TUNNEL RELEASE;  Surgeon: Leanora Cover, MD;  Location: [place];  Service: Orthopedics;  Laterality: Right;  . CESAREAN SECTION    . CHOLECYSTECTOMY    . HERNIA REPAIR     x 2  . HYSTEROPLASTY REPAIR OF UTERINE ANOMALY    . HYSTEROSCOPY W/D&C  01/13/2012   Procedure: DILATATION AND CURETTAGE /HYSTEROSCOPY;  Surgeon: Osborne Oman, MD;  Location: [facility];  Service: Gynecology;  Laterality: N/A;  Pap smear  . [place] RESECTION  2016  . TOE SURGERY Left    second toe. rod placed  . WISDOM TOOTH EXTRACTION  11/20/10     FAMILY HISTORY: Family History  Problem Relation Age of Onset  . Hypertension Mother   . Breast cancer Mother   . Hypertension Father   . Diabetes Maternal Grandmother   . Diabetes Paternal Grandmother    As of August 2020 Dawn Galvan father is  in his 67's. Patients' mother is65 she also carries a PALB2 mutation and has been diagnosed with breast cancer. The patient has 1 brother and 2 sisters, none with cancer. Two of her maternal aunts (3 out of 8 sisters) had breast cancer. Patient denies anyone in her family having ovarian or pancreatic cancer.    GYNECOLOGIC HISTORY:  No LMP recorded. Patient has had an implant. Menarche: 43 years old Age at first live birth: 43 years old Floyd P: 2 LMP: unknown, uses implant birth control Contraceptive: implanon Hysterectomy?: no BSO?: no   SOCIAL  HISTORY: (Current as of 10/05/2018) Dawn Galvan is a single. 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 She has two children: Dawn Galvan and Dawn Galvan. Dawn Galvan is 66; she is a Museum/gallery exhibitions officer at [employer] Dawn Galvan is 12, in 9th grade.  The patient does not belong to a church, synagogue, or mosque.    ADVANCED DIRECTIVES: Not in place    HEALTH MAINTENANCE: Social History   Tobacco Use  . Smoking status: Former Smoker    Years: 0.00    Quit date: 02/22/2009    Years since quitting: 9.6  . Smokeless tobacco: Never Used  Substance Use Topics  . Alcohol use: Yes    Comment: Social  . Drug use: No    Colonoscopy: Dr. Lyndel Safe in [place]  [facility]  Bone density: none [facility]  No Known Allergies  Current Outpatient Medications  Medication Sig Dispense Refill  . Probiotic Product (PROBIOTIC DAILY PO) Take 1 capsule by mouth daily.    . sertraline (ZOLOFT) 50 MG tablet TAKE 1 TABLET BY MOUTH EVERY DAY 30 tablet 2   No current facility-administered medications for this visit.      OBJECTIVE: Morbidly obese African-American woman who appears stated age  13:   10/05/18 1617  BP: 122/75  Pulse: 74  Resp: 18  Temp: 98.7 F (37.1 C)  SpO2: 100%     Body mass index is 56.13 kg/m.   Wt Readings from Last 3 Encounters:  10/05/18 (!) 327 lb (148.3 kg)  02/28/18 (!) 331 lb (150.1 kg)  11/14/17 (!) 328 lb (148.8 kg)      ECOG FS:1 - Symptomatic but completely ambulatory  Ocular: Sclerae unicteric, pupils round and equal Wearing a mask Lymphatic: No cervical or supraclavicular adenopathy Lungs no rales or rhonchi Heart regular rate and rhythm Abd soft, obese, nontender, positive bowel sounds MSK no focal spinal tenderness, no joint edema Neuro: non-focal, well-oriented, appropriate affect Breasts: Breasts are large and pendulous, but there are no skin or nipple changes of concern.  Both axillae are benign.   LAB RESULTS:   CMP     Component Value Date/Time   NA 143 02/28/2016 1117   K 4.3 02/28/2016 1117   CL 104 02/28/2016 1117   CO2 22 02/28/2016 1117   GLUCOSE 92 02/28/2016 1117   GLUCOSE 100 (H) 08/18/2013 0957   BUN 9 02/28/2016 1117   CREATININE 0.75 02/28/2016 1117   CALCIUM 9.0 02/28/2016 1117   GFRNONAA 100 02/28/2016 1117   GFRAA 115 02/28/2016 1117    No results found for: TOTALPROTELP, ALBUMINELP, A1GS, A2GS, BETS, BETA2SER, GAMS, MSPIKE, SPEI  No results found for: KPAFRELGTCHN, LAMBDASER, KAPLAMBRATIO  Lab Results  Component Value Date   WBC 11.6 (H) 04/29/2017   HGB 12.9 04/29/2017   HCT 40.1 04/29/2017   MCV 89.1 04/29/2017   PLT 319 04/29/2017    '@LASTCHEMISTRY'$ @  No results  found for: LABCA2  No components found for: HQPRFF638  No results for input(s): INR in the last 168 hours.  No results found for: LABCA2  No results found for: GYK599  No results found for: JTT017  No results found for: BLT903  No results found for: CA2729  No components found for: HGQUANT  No results found for: CEA1 / No results found for: CEA1   No results found for: AFPTUMOR  No results found for: CHROMOGRNA  No results found for: PSA1  No visits with results within 3 Day(s) from this visit.  Latest known visit with results is:  Office Visit on 02/28/2018  Component Date Value Ref Range Status  . Color, UA 02/28/2018 Yellow   Final  . Clarity, UA 02/28/2018 Cloudy   Final  . Glucose, UA 02/28/2018 Negative  Negative Final  . Bilirubin, UA 02/28/2018 Negative   Final  . Ketones, UA 02/28/2018 Trace   Final  . Spec Grav, UA 02/28/2018 >=1.030* 1.010 - 1.025 Final  . Blood, UA 02/28/2018 Trace-intact   Final  . pH, UA 02/28/2018 5.5  5.0 - 8.0 Final  . Protein, UA 02/28/2018 Positive* Negative Final  . Urobilinogen, UA 02/28/2018 2.0* 0.2 or 1.0 E.U./dL Final  . Nitrite, UA 02/28/2018 Negative   Final  . Leukocytes, UA 02/28/2018 Large (3+)* Negative Final  . Specific  Gravity, UA 02/28/2018      >=1.030* 1.005 - 1.030 Final  . pH, UA 02/28/2018 5.5  5.0 - 7.5 Final  . Color, UA 02/28/2018 Yellow  Yellow Final  . Appearance Ur 02/28/2018 Turbid* Clear Final  . Leukocytes, UA 02/28/2018 3+* Negative Final  . Protein, UA 02/28/2018 1+* Negative/Trace Final  . Glucose, UA 02/28/2018 Negative  Negative Final  . Ketones, UA 02/28/2018 Negative  Negative Final  . RBC, UA 02/28/2018 Negative  Negative Final  . Bilirubin, UA 02/28/2018 Negative  Negative Final  . Urobilinogen, Ur 02/28/2018 1.0  0.2 - 1.0 mg/dL Final  . Nitrite, UA 02/28/2018 Negative  Negative Final  . Microscopic Examination 02/28/2018 See below:   Final   Microscopic was indicated and was performed.  . Urine Culture, Routine 02/28/2018 Final report   Final  . Organism ID, Bacteria 02/28/2018 Comment   Final   Comment: Mixed urogenital flora 10,000-25,000 colony forming units per mL   . WBC, UA 02/28/2018 >30* 0 - 5 /hpf Final  . RBC, UA 02/28/2018 None seen  0 - 2 /hpf Final  . Epithelial Cells (non renal) 02/28/2018 >10* 0 - 10 /hpf Final  . Casts 02/28/2018 None seen  None seen /lpf Final  . Mucus, UA 02/28/2018 Present  Not Estab. Final  . Bacteria, UA 02/28/2018 Many* None seen/Few Final    (this displays the last labs from the last 3 days)  No results found for: TOTALPROTELP, ALBUMINELP, A1GS, A2GS, BETS, BETA2SER, GAMS, MSPIKE, SPEI (this displays SPEP labs)  No results found for: KPAFRELGTCHN, LAMBDASER, KAPLAMBRATIO (kappa/lambda light chains)  No results found for: HGBA, HGBA2QUANT, HGBFQUANT, HGBSQUAN (Hemoglobinopathy evaluation)   No results found for: LDH  No results found for: IRON, TIBC, IRONPCTSAT (Iron and TIBC)  No results found for: FERRITIN  Urinalysis    Component Value Date/Time   COLORURINE YELLOW 11/14/2017 [place] (A) 02/28/2018 1712   LABSPEC 1.023 11/14/2017 1244   PHURINE 5.0 11/14/2017 [place] Negative 02/28/2018  [place] 11/14/2017 1244   BILIRUBINUR Negative 02/28/2018 1833   BILIRUBINUR Negative  02/28/2018 1712   KETONESUR 5 (A) 11/14/2017 1244   PROTEINUR Positive (A) 02/28/2018 1833   PROTEINUR 1+ (A) 02/28/2018 1712   PROTEINUR NEGATIVE 11/14/2017 1244   UROBILINOGEN 2.0 (A) 02/28/2018 1833   UROBILINOGEN 0.2 12/29/2012 0955   NITRITE Negative 02/28/2018 1833   NITRITE Negative 02/28/2018 1712   NITRITE NEGATIVE 11/14/2017 1244   LEUKOCYTESUR Large (3+) (A) 02/28/2018 1833   LEUKOCYTESUR 3+ (A) 02/28/2018 1712     STUDIES:  Bilateral diagnostic mammography at the breast center 08/09/2018 showed the breast density to be category B.  There was no evidence of malignancy  ELIGIBLE FOR AVAILABLE RESEARCH PROTOCOL: no   ASSESSMENT: 43 y.o. 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   (2) patient opted for bilateral prophylactic mastectomies   PLAN: I spent approximately 50 minutes face to face with Ceairra with more than 50% of that time spent in counseling and coordination of care. Specifically we reviewed the biology of the patient's diagnosis and the specifics of her situation.  She understands that in patients who carry a deleterious mutation like the one that runs in her family, the risk of a new breast cancer developing in the future is sufficiently great that the patient may choose bilateral mastectomies. However if she wishes to keep her breasts in that situation it is safe to do so. That would require intensified screening, which generally means not only yearly mammography but a yearly breast MRI as well.   Sundee initially opted for intensified screening which was also her mother's choice.  Rylynne has found this not to be workable.  She becomes incredibly anxious long before she has the  mammogram.  This interrupts her life and get in the way of her normal activities.  In general she feels very much at risk of breast cancer and wishes to have bilateral mastectomies to "get rid of the problem". This is not unreasonable.  She had very little information on reconstruction.  Apparently someone told her about DIEP.  She understands we do not do this in [place].  I reviewed that option as well as no reconstruction, implant reconstruction, and the various flaps.  I think the best option for her probably is bilateral implant reconstruction but of course she needs to discuss this more in detail with Dr. Donne Hazel and the plastic surgeon of his choice.  We have gone ahead and placed the referral.  In case she decides against bilateral mastectomies I am scheduling her for breast MRI in December.  If she does have the surgery she can simply cancel that appointment.  Otherwise she will return to see me one more time in April of next year.  I expect she will be ready to "graduate from follow-up" at that point.  Adleigh has a good understanding of the overall plan. She agrees with it. She knows the goal of treatment in her case is prevention. She will call with any problems that may develop before her next visit here.    Ayansh Feutz, Virgie Dad, MD  10/05/18 5:01 PM Medical Oncology and Hematology [facility] [address] [place], [place] [address] Tel. [phone]    Fax. [phone]   I, Jacqualyn Posey am acting as a Education administrator for Chauncey Cruel, MD.   I, Lurline Del MD, have reviewed the above documentation for accuracy and completeness, and  I agree with the above.

## 2018-10-05 ENCOUNTER — Inpatient Hospital Stay: Payer: Medicaid Other | Attending: Oncology | Admitting: Oncology

## 2018-10-05 ENCOUNTER — Encounter: Payer: Self-pay | Admitting: Oncology

## 2018-10-05 ENCOUNTER — Other Ambulatory Visit: Payer: Self-pay

## 2018-10-05 VITALS — BP 122/75 | HR 74 | Temp 98.7°F | Resp 18 | Ht 64.0 in | Wt 327.0 lb

## 2018-10-05 DIAGNOSIS — Z87891 Personal history of nicotine dependence: Secondary | ICD-10-CM

## 2018-10-05 DIAGNOSIS — Z1509 Genetic susceptibility to other malignant neoplasm: Secondary | ICD-10-CM | POA: Diagnosis not present

## 2018-10-05 DIAGNOSIS — Z9189 Other specified personal risk factors, not elsewhere classified: Secondary | ICD-10-CM

## 2018-10-05 DIAGNOSIS — Z1589 Genetic susceptibility to other disease: Secondary | ICD-10-CM | POA: Diagnosis not present

## 2018-10-05 DIAGNOSIS — Z1501 Genetic susceptibility to malignant neoplasm of breast: Secondary | ICD-10-CM | POA: Insufficient documentation

## 2018-10-05 DIAGNOSIS — Z803 Family history of malignant neoplasm of breast: Secondary | ICD-10-CM | POA: Insufficient documentation

## 2018-10-05 NOTE — Addendum Note (Signed)
Addended by: Chauncey Cruel on: 10/05/2018 05:14 PM   Modules accepted: Orders

## 2018-10-06 ENCOUNTER — Telehealth: Payer: Self-pay | Admitting: Oncology

## 2018-10-06 NOTE — Telephone Encounter (Signed)
I left a message regarding schedule  

## 2018-10-17 ENCOUNTER — Ambulatory Visit (INDEPENDENT_AMBULATORY_CARE_PROVIDER_SITE_OTHER): Payer: Medicaid Other | Admitting: Internal Medicine

## 2018-10-17 ENCOUNTER — Other Ambulatory Visit: Payer: Self-pay

## 2018-10-17 DIAGNOSIS — F339 Major depressive disorder, recurrent, unspecified: Secondary | ICD-10-CM

## 2018-10-17 DIAGNOSIS — F3341 Major depressive disorder, recurrent, in partial remission: Secondary | ICD-10-CM

## 2018-10-17 NOTE — Progress Notes (Signed)
  [facility] Internal Medicine Residency Telephone Encounter Continuity Care Appointment  HPI:   This telephone encounter was created for [name] on 10/17/2018 for the following purpose/cc morbid obesity and depression.   Past Medical History:  Past Medical History:  Diagnosis Date  . Carpal tunnel syndrome    right  . 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 Family history of prostate cancer   . GERD (gastroesophageal reflux disease)   . Headache(784.0)    migraines or sinus  . History of vertigo    states comes and goes; no current med.  . Obesity   . Osteoarthritis   . PONV (postoperative nausea and vomiting)   . Rash 08/10/2013   right thigh  . Sleep apnea    does not wear cpap      ROS:  Negative for SI/HI. Negative for fevers, chills, appetite changes, n/v, abdominal pain, changes in bowel movements.    Assessment / Plan / Recommendations:   Please see A&P under problem oriented charting for assessment of the patient's acute and chronic medical conditions.   As always, pt is advised that if symptoms worsen or new symptoms arise, they should go to an urgent care facility or to to ER for further evaluation.   Consent and Medical Decision Making:   Patient discussed with Dr. Philipp Ovens  This is a telephone encounter between Dawn Galvan and Delice Bison on 10/17/2018 for morbid obesity and depression. The visit was conducted with the patient located at home and Delice Bison at [facility]. The patient's identity was confirmed using their DOB and current address. The patient has consented to being evaluated through a telephone encounter and understands the associated risks (an examination cannot be done and the patient may need to come in for an appointment) / benefits (allows the patient to remain at home, decreasing exposure to coronavirus). I personally spent 15 minutes on medical  discussion.

## 2018-10-18 ENCOUNTER — Encounter: Payer: Self-pay | Admitting: Internal Medicine

## 2018-10-18 NOTE — Assessment & Plan Note (Signed)
Patient is followed by bariatric surgeon with [facility]. She has been placed on 1400 calorie diet, but is having a difficult time adhering to it. I had previously referred her to [facility], but she did not follow through with appointment. She would like to have this referral placed again for assistance with meal planning and nutrition education. Referral to [facility] placed today.

## 2018-10-18 NOTE — Assessment & Plan Note (Signed)
Patient is on Zoloft 50 mg daily. Endorses poorly controlled symptoms, specifically depressed mood, fatigue and anhedonia. She has a difficult time bringing herself to get out of bed and make it to appointments due to lack of motivation. She was advised by bariatric surgeon to seek counseling. She is agreeable with referral to [facility] today for counseling. I think if she gets better control of depression she will also have more success with adhering to diet plan for weight loss.

## 2018-10-19 NOTE — Addendum Note (Signed)
Addended by: Modena Nunnery D on: 10/19/2018 10:12 AM   Modules accepted: Level of Service

## 2018-10-19 NOTE — Progress Notes (Signed)
Internal Medicine Clinic Attending  Case discussed with Dr. Koleen Distance soon after the telehealth visit.  We reviewed the resident's history and pertinent patient test results.  I agree with the assessment, diagnosis, and plan of care documented in the resident's note.

## 2018-10-21 ENCOUNTER — Telehealth: Payer: Self-pay | Admitting: Dietician

## 2018-10-21 NOTE — Telephone Encounter (Signed)
Virtual appointment schedule for 11/03/18 at 315 PM.

## 2018-10-27 DIAGNOSIS — Z9884 Bariatric surgery status: Secondary | ICD-10-CM | POA: Diagnosis not present

## 2018-10-27 DIAGNOSIS — Z6841 Body Mass Index (BMI) 40.0 and over, adult: Secondary | ICD-10-CM | POA: Diagnosis not present

## 2018-10-27 DIAGNOSIS — Z713 Dietary counseling and surveillance: Secondary | ICD-10-CM | POA: Diagnosis not present

## 2018-10-28 DIAGNOSIS — Z1501 Genetic susceptibility to malignant neoplasm of breast: Secondary | ICD-10-CM | POA: Diagnosis not present

## 2018-10-28 DIAGNOSIS — Z1589 Genetic susceptibility to other disease: Secondary | ICD-10-CM | POA: Diagnosis not present

## 2018-10-28 DIAGNOSIS — Z1509 Genetic susceptibility to other malignant neoplasm: Secondary | ICD-10-CM | POA: Diagnosis not present

## 2018-10-28 DIAGNOSIS — Z803 Family history of malignant neoplasm of breast: Secondary | ICD-10-CM | POA: Diagnosis not present

## 2018-11-01 ENCOUNTER — Telehealth: Payer: Self-pay | Admitting: Licensed Clinical Social Worker

## 2018-11-01 NOTE — Telephone Encounter (Signed)
Patient was contacted due to a referral from her doctor. Patient agreed to services, and will be added to my schedule for 9/22 @ 2;00.

## 2018-11-03 ENCOUNTER — Ambulatory Visit: Payer: Medicaid Other | Admitting: Dietician

## 2018-11-03 ENCOUNTER — Telehealth: Payer: Self-pay | Admitting: Dietician

## 2018-11-03 NOTE — Patient Instructions (Addendum)
Dawn Galvan,   It was nicr talking to you! I hope this information helps.   Dawn Galvan [phone]  Writing down what we eat and drink can help [place] to see it in a different way.   It may help [place] to change habits.   What we eat and drink are habits formed during our lifetime.   Habits are learned and can be unlearned and changed.   Please write down the foods and beverages you have during the day. Please include at least the time you eat and drink, what you eat and drink and how much you eat and drink. Includes as much detail as you can.  Time What    How much Calories  8 AM Cooked oatmeal  1 Cup  150  Coffee    1 mug    20              Sugar in oatmeal  1 teaspoon   16         Sugar in coffee  2 teaspoons   32  Milk in oatmeal   1/4 cup   40  Creamer in coffee   2 teaspoons   35  Margarine in oatmeal  1 teaspoon        50 Total   Calorie Counting for Weight Loss Calories are units of energy. Your body needs a certain amount of calories from food to keep you going throughout the day. When you eat more calories than your body needs, your body stores the extra calories as fat. When you eat fewer calories than your body needs, your body burns fat to get the energy it needs. Calorie counting means keeping track of how many calories you eat and drink each day. Calorie counting can be helpful if you need to lose weight. If you make sure to eat fewer calories than your body needs, you should lose weight. Ask your health care provider what a healthy weight is for you. For calorie counting to work, you will need to eat the right number of calories in a day in order to lose a healthy amount of weight per week. A dietitian can help you determine how many calories you need in a day and will give you suggestions on how to reach your calorie goal.  A healthy amount of weight to lose per week is usually 1-2 lb (0.5-0.9 kg). This usually means that your daily calorie intake should be reduced by 500-750  calories.  Eating 1,200 - 1,500 calories per day can help most women lose weight.  Eating 1,500 - 1,800 calories per day can help most men lose weight. What is my plan? My goal is to have __________ calories per day. If I have this many calories per day, I should lose around __________ pounds per week. What do I need to know about calorie counting? In order to meet your daily calorie goal, you will need to:  Find out how many calories are in each food you would like to eat. Try to do this before you eat.  Decide how much of the food you plan to eat.  Write down what you ate and how many calories it had. Doing this is called keeping a food log. To successfully lose weight, it is important to balance calorie counting with a healthy lifestyle that includes regular activity. Aim for 150 minutes of moderate exercise (such as walking) or 75 minutes of vigorous exercise (such as running) each week. Where do I  find calorie information?  The number of calories in a food can be found on a Nutrition Facts label. If a food does not have a Nutrition Facts label, try to look up the calories online or ask your dietitian for help. Remember that calories are listed per serving. If you choose to have more than one serving of a food, you will have to multiply the calories per serving by the amount of servings you plan to eat. For example, the label on a package of bread might say that a serving size is 1 slice and that there are 90 calories in a serving. If you eat 1 slice, you will have eaten 90 calories. If you eat 2 slices, you will have eaten 180 calories. How do I keep a food log? Immediately after each meal, record the following information in your food log:  What you ate. Don't forget to include toppings, sauces, and other extras on the food.  How much you ate. This can be measured in cups, ounces, or number of items.  How many calories each food and drink had.  The total number of calories in the  meal. Keep your food log near you, such as in a small notebook in your pocket, or use a mobile app or website. Some programs will calculate calories for you and show you how many calories you have left for the day to meet your goal. What are some calorie counting tips?   Use your calories on foods and drinks that will fill you up and not leave you hungry: ? Some examples of foods that fill you up are nuts and nut butters, vegetables, lean proteins, and high-fiber foods like whole grains. High-fiber foods are foods with more than 5 g fiber per serving. ? Drinks such as sodas, specialty coffee drinks, alcohol, and juices have a lot of calories, yet do not fill you up.  Eat nutritious foods and avoid empty calories. Empty calories are calories you get from foods or beverages that do not have many vitamins or protein, such as candy, sweets, and soda. It is better to have a nutritious high-calorie food (such as an avocado) than a food with few nutrients (such as a bag of chips).  Know how many calories are in the foods you eat most often. This will help you calculate calorie counts faster.  Pay attention to calories in drinks. Low-calorie drinks include water and unsweetened drinks.  Pay attention to nutrition labels for "low fat" or "fat free" foods. These foods sometimes have the same amount of calories or more calories than the full fat versions. They also often have added sugar, starch, or salt, to make up for flavor that was removed with the fat.  Find a way of tracking calories that works for you. Get creative. Try different apps or programs if writing down calories does not work for you. What are some portion control tips?  Know how many calories are in a serving. This will help you know how many servings of a certain food you can have.  Use a measuring cup to measure serving sizes. You could also try weighing out portions on a kitchen scale. With time, you will be able to estimate serving  sizes for some foods.  Take some time to put servings of different foods on your favorite plates, bowls, and cups so you know what a serving looks like.  Try not to eat straight from a bag or box. Doing this can lead to overeating.  Put the amount you would like to eat in a cup or on a plate to make sure you are eating the right portion.  Use smaller plates, glasses, and bowls to prevent overeating.  Try not to multitask (for example, watch TV or use your computer) while eating. If it is time to eat, sit down at a table and enjoy your food. This will help you to know when you are full. It will also help you to be aware of what you are eating and how much you are eating. What are tips for following this plan? Reading food labels  Check the calorie count compared to the serving size. The serving size may be smaller than what you are used to eating.  Check the source of the calories. Make sure the food you are eating is high in vitamins and protein and low in saturated and trans fats. Shopping  Read nutrition labels while you shop. This will help you make healthy decisions before you decide to purchase your food.  Make a grocery list and stick to it. Cooking  Try to cook your favorite foods in a healthier way. For example, try baking instead of frying.  Use low-fat dairy products. Meal planning  Use more fruits and vegetables. Half of your plate should be fruits and vegetables.  Include lean proteins like poultry and fish. How do I count calories when eating out?  Ask for smaller portion sizes.  Consider sharing an entree and [place] instead of getting your own entree.  If you get your own entree, eat only half. Ask for a box at the beginning of your meal and put the rest of your entree in it so you are not tempted to eat it.  If calories are listed on the menu, choose the lower calorie options.  Choose dishes that include vegetables, fruits, whole grains, low-fat dairy products,  and lean protein.  Choose items that are boiled, broiled, grilled, or steamed. Stay away from items that are buttered, battered, fried, or served with cream sauce. Items labeled "crispy" are usually fried, unless stated otherwise.  Choose water, low-fat milk, unsweetened iced tea, or other drinks without added sugar. If you want an alcoholic beverage, choose a lower calorie option such as a glass of wine or light beer.  Ask for dressings, sauces, and syrups on the side. These are usually high in calories, so you should limit the amount you eat.  If you want a salad, choose a garden salad and ask for grilled meats. Avoid extra toppings like bacon, cheese, or fried items. Ask for the dressing on the side, or ask for olive oil and vinegar or lemon to use as dressing.  Estimate how many servings of a food you are given. For example, a serving of cooked rice is  cup or about the size of half a baseball. Knowing serving sizes will help you be aware of how much food you are eating at restaurants. The list below tells you how big or small some common portion sizes are based on everyday objects: ? 1 oz-4 stacked dice. ? 3 oz-1 deck of cards. ? 1 tsp-1 die. ? 1 Tbsp- a ping-pong ball. ? 2 Tbsp-1 ping-pong ball. ?  cup- baseball. ? 1 cup-1 baseball. Summary  Calorie counting means keeping track of how many calories you eat and drink each day. If you eat fewer calories than your body needs, you should lose weight.  A healthy amount of weight to lose per week is  usually 1-2 lb (0.5-0.9 kg). This usually means reducing your daily calorie intake by 500-750 calories.  The number of calories in a food can be found on a Nutrition Facts label. If a food does not have a Nutrition Facts label, try to look up the calories online or ask your dietitian for help.  Use your calories on foods and drinks that will fill you up, and not on foods and drinks that will leave you hungry.  Use smaller plates, glasses,  and bowls to prevent overeating.

## 2018-11-03 NOTE — Progress Notes (Signed)
Documentation:  Telephone visit due to COVID-19. Answered Ms.Seeber questions about keeping track of her calories to achieve further weight loss after bariatric surgery. Agreed to mail her food and activity trackers and my contact information for further questions.  She is interested in a referral to [facility] [facility] for further counseling and support.   Debera Lat, RD 11/04/2018 5:56 PM.

## 2018-11-07 ENCOUNTER — Ambulatory Visit: Payer: Medicaid Other | Admitting: Hematology and Oncology

## 2018-11-08 NOTE — Progress Notes (Signed)
No show

## 2018-11-09 ENCOUNTER — Encounter: Payer: Self-pay | Admitting: Oncology

## 2018-11-09 ENCOUNTER — Inpatient Hospital Stay (HOSPITAL_BASED_OUTPATIENT_CLINIC_OR_DEPARTMENT_OTHER): Payer: Medicaid Other | Admitting: Oncology

## 2018-11-09 DIAGNOSIS — Z1509 Genetic susceptibility to other malignant neoplasm: Secondary | ICD-10-CM

## 2018-11-09 DIAGNOSIS — Z1589 Genetic susceptibility to other disease: Secondary | ICD-10-CM

## 2018-11-09 DIAGNOSIS — Z1501 Genetic susceptibility to malignant neoplasm of breast: Secondary | ICD-10-CM

## 2018-11-15 ENCOUNTER — Ambulatory Visit: Payer: Medicaid Other | Admitting: Licensed Clinical Social Worker

## 2018-11-16 ENCOUNTER — Other Ambulatory Visit: Payer: Self-pay | Admitting: Dietician

## 2018-11-16 NOTE — Progress Notes (Signed)
Referral to [facility].

## 2018-11-17 NOTE — Telephone Encounter (Signed)
Opened in error

## 2018-11-24 DIAGNOSIS — Z1501 Genetic susceptibility to malignant neoplasm of breast: Secondary | ICD-10-CM | POA: Diagnosis not present

## 2018-11-24 DIAGNOSIS — Z1589 Genetic susceptibility to other disease: Secondary | ICD-10-CM | POA: Diagnosis not present

## 2018-11-24 DIAGNOSIS — Z1509 Genetic susceptibility to other malignant neoplasm: Secondary | ICD-10-CM | POA: Diagnosis not present

## 2018-11-24 DIAGNOSIS — Z6841 Body Mass Index (BMI) 40.0 and over, adult: Secondary | ICD-10-CM | POA: Diagnosis not present

## 2018-11-29 ENCOUNTER — Telehealth: Payer: Self-pay | Admitting: Licensed Clinical Social Worker

## 2018-11-29 ENCOUNTER — Ambulatory Visit: Payer: Medicaid Other | Admitting: Licensed Clinical Social Worker

## 2018-11-29 NOTE — Telephone Encounter (Signed)
Patient was contacted for her scheduled appointment. Patient did not answer, and a vm was left for the patient.  

## 2018-11-29 NOTE — Telephone Encounter (Signed)
Patient called back after missing her 2:00 phone session. Patient was encouraged to reschedule her appointment due to only having 10 minutes remaining of her phone session. Patient was told that 10 minutes of time would not be sufficient to conduct an initial therapy session. Patient reported that she did not understand, and hung up the phone.

## 2018-12-07 DIAGNOSIS — Z1509 Genetic susceptibility to other malignant neoplasm: Secondary | ICD-10-CM | POA: Diagnosis not present

## 2018-12-07 DIAGNOSIS — Z1589 Genetic susceptibility to other disease: Secondary | ICD-10-CM | POA: Diagnosis not present

## 2018-12-07 DIAGNOSIS — Z1501 Genetic susceptibility to malignant neoplasm of breast: Secondary | ICD-10-CM | POA: Diagnosis not present

## 2018-12-08 ENCOUNTER — Telehealth: Payer: Self-pay | Admitting: Internal Medicine

## 2018-12-08 NOTE — Telephone Encounter (Signed)
Pt having vaginal itching.  Pt requesting a call back about a medication.

## 2018-12-08 NOTE — Telephone Encounter (Signed)
Pt wants something called in for "the itch" in her vagina. She is informed she will need and appointment to determine if medication needs to be prescribed. She states an appointment does not fit into her schedule today or tomorrow, "just call something in". She again offered appointment and refused. Advised urg care this pm, she states she will be there for hours, looked up 30 min wait now, she was advised. States it might be 1830 before she can go. Offered am appt again, refused

## 2018-12-14 ENCOUNTER — Other Ambulatory Visit: Payer: Self-pay | Admitting: General Surgery

## 2018-12-14 DIAGNOSIS — Z1589 Genetic susceptibility to other disease: Secondary | ICD-10-CM

## 2018-12-28 DIAGNOSIS — Z6841 Body Mass Index (BMI) 40.0 and over, adult: Secondary | ICD-10-CM | POA: Diagnosis not present

## 2018-12-28 DIAGNOSIS — Z713 Dietary counseling and surveillance: Secondary | ICD-10-CM | POA: Diagnosis not present

## 2018-12-28 DIAGNOSIS — Z9884 Bariatric surgery status: Secondary | ICD-10-CM | POA: Diagnosis not present

## 2018-12-29 NOTE — Addendum Note (Signed)
Addended by: Yvonna Alanis E on: 12/29/2018 05:00 PM   Modules accepted: Orders

## 2019-01-03 ENCOUNTER — Other Ambulatory Visit: Payer: Medicaid Other

## 2019-01-10 ENCOUNTER — Other Ambulatory Visit: Payer: Self-pay

## 2019-01-10 ENCOUNTER — Ambulatory Visit
Admission: RE | Admit: 2019-01-10 | Discharge: 2019-01-10 | Disposition: A | Discharge status: Home / Self Care | Payer: Medicaid Other | Source: Ambulatory Visit | Attending: General Surgery | Admitting: General Surgery

## 2019-01-10 DIAGNOSIS — Z803 Family history of malignant neoplasm of breast: Secondary | ICD-10-CM | POA: Diagnosis not present

## 2019-01-10 DIAGNOSIS — Z1589 Genetic susceptibility to other disease: Secondary | ICD-10-CM

## 2019-01-10 DIAGNOSIS — Z1501 Genetic susceptibility to malignant neoplasm of breast: Secondary | ICD-10-CM | POA: Diagnosis not present

## 2019-01-10 MED ORDER — GADOBUTROL 1 MMOL/ML IV SOLN
10.0000 mL | Freq: Once | INTRAVENOUS | Status: AC | PRN
  Administered 2019-01-10: 10 mL via INTRAVENOUS

## 2019-01-11 NOTE — Progress Notes (Addendum)
Arnold Long, [employer] cabin crew, called [facility] who said they got Ms. Pergola referral, but have not started working on it yet. I called [facility] to follow up on referral sent there: they do not have the referral. Patient is being seen by Bartholomew Crews (see their last note dated 12/28/2018 with planned 3 month follow up) Per Dr. Janne Napoleon referral she was referring Ms Bunney for nutrition educaiton and support which it appears Ms Blacksher is currently receiving at [facility].

## 2019-01-13 ENCOUNTER — Encounter: Payer: Self-pay | Admitting: Oncology

## 2019-01-27 ENCOUNTER — Other Ambulatory Visit: Payer: Self-pay | Admitting: *Deleted

## 2019-03-13 ENCOUNTER — Emergency Department (HOSPITAL_COMMUNITY)
Admission: EM | Admit: 2019-03-13 | Discharge: 2019-03-14 | Disposition: A | Discharge status: Home / Self Care | Payer: Medicaid Other | Attending: Emergency Medicine | Admitting: Emergency Medicine

## 2019-03-13 ENCOUNTER — Other Ambulatory Visit: Payer: Self-pay

## 2019-03-13 DIAGNOSIS — Z87891 Personal history of nicotine dependence: Secondary | ICD-10-CM | POA: Diagnosis not present

## 2019-03-13 DIAGNOSIS — Z79899 Other long term (current) drug therapy: Secondary | ICD-10-CM | POA: Diagnosis not present

## 2019-03-13 DIAGNOSIS — J069 Acute upper respiratory infection, unspecified: Secondary | ICD-10-CM | POA: Diagnosis not present

## 2019-03-13 DIAGNOSIS — J029 Acute pharyngitis, unspecified: Secondary | ICD-10-CM | POA: Diagnosis present

## 2019-03-13 DIAGNOSIS — B9789 Other viral agents as the cause of diseases classified elsewhere: Secondary | ICD-10-CM | POA: Diagnosis not present

## 2019-03-13 LAB — GROUP A STREP BY PCR: Group A Strep by PCR: NOT DETECTED

## 2019-03-13 NOTE — ED Notes (Signed)
Called pt no answer x1 

## 2019-03-13 NOTE — ED Triage Notes (Signed)
Pt c/o sore throat and congestion that began 3 days ago.

## 2019-03-13 NOTE — ED Notes (Signed)
Pt called for room. No answer x 3 

## 2019-03-14 NOTE — ED Notes (Signed)
Pt given discharge paperwork in which she read and understood. No distress noted. Pt left the ED with a smooth and steady gait. Pt conscious, breathing, and A&Ox4.

## 2019-03-14 NOTE — ED Provider Notes (Signed)
[facility] EMERGENCY DEPARTMENT Provider Note   CSN: [id number] Arrival date & time: 03/13/19  2117     History Chief Complaint  Patient presents with  . Sore Throat    Dawn Galvan is a 44 y.o. female.  Patient presents to the emergency department with a chief complaint of sore throat.  She reports associated postnasal drip, and cough.  She denies any fevers.  Denies any known Covid exposures.  Denies nausea, vomiting, or diarrhea.  She denies any other associated symptoms.  She has tried taking Mucinex at home with no relief.  The history is provided by the patient. No language interpreter was used.       Past Medical History:  Diagnosis Date  . Carpal tunnel syndrome    right  . 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 Family history of prostate cancer   . GERD (gastroesophageal reflux disease)   . Headache(784.0)    migraines or sinus  . History of vertigo    states comes and goes; no current med.  . Obesity   . Osteoarthritis   . PONV (postoperative nausea and vomiting)   . Rash 08/10/2013   right thigh  . Sleep apnea    does not wear cpap    Patient Active Problem List   Diagnosis Date Noted  . At high risk for breast cancer 10/05/2018  . 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 Genetic testing 09/10/2017  . Family history of breast cancer   . Family history of prostate cancer   . Abnormal chest x-ray 03/24/2017  . Pharyngitis 07/14/2016  . Heel pain 06/25/2016  . Urinary frequency 02/28/2016  . S/P bariatric surgery 09/04/2015  . Depression 08/05/2015  . Muscle spasm of back 08/05/2015  . Carpal tunnel syndrome 08/05/2015  . Prediabetes 06/08/2014  . Morbid obesity ([place]) 06/12/2011    Past Surgical History:  Procedure Laterality Date  . CARPAL TUNNEL RELEASE Left 08/18/2013   Procedure: LEFT CARPAL TUNNEL  RELEASE;  Surgeon: Tennis Must, MD;  Location: [place];  Service: Orthopedics;  Laterality: Left;  . CARPAL TUNNEL RELEASE Right 04/29/2017   Procedure: RIGHT CARPAL TUNNEL RELEASE;  Surgeon: Leanora Cover, MD;  Location: [place];  Service: Orthopedics;  Laterality: Right;  . CESAREAN SECTION    . CHOLECYSTECTOMY    . HERNIA REPAIR     x 2  . HYSTEROPLASTY REPAIR OF UTERINE ANOMALY    . HYSTEROSCOPY WITH D & C  01/13/2012   Procedure: DILATATION AND CURETTAGE /HYSTEROSCOPY;  Surgeon: Osborne Oman, MD;  Location: [facility];  Service: Gynecology;  Laterality: N/A;  Pap smear  . [place] RESECTION  2016  . TOE SURGERY Left    second toe. rod placed  . WISDOM TOOTH EXTRACTION  11/20/10     OB History    Gravida  2   Para  2   Term  2   Preterm  0   AB  0   Living  2     SAB  0   TAB  0   Ectopic  0   Multiple  0   Live Births              Family History  Problem Relation Age of Onset  . Hypertension Mother   . Breast cancer Mother   .  Hypertension Father   . Diabetes Maternal Grandmother   . Diabetes Paternal Grandmother     Social History   Tobacco Use  . Smoking status: Former Smoker    Years: 0.00    Quit date: 02/22/2009    Years since quitting: 10.0  . Smokeless tobacco: Never Used  Substance Use Topics  . Alcohol use: Yes    Comment: Social  . Drug use: No    Home Medications Prior to Admission medications   Medication Sig Start Date End Date Taking? Authorizing Provider  Probiotic Product (PROBIOTIC DAILY PO) Take 1 capsule by mouth daily.    [provider]  sertraline (ZOLOFT) 50 MG tablet TAKE 1 TABLET BY MOUTH EVERY DAY 08/17/18   [place], Nila Nephew D, DO    Allergies    Patient has no known allergies.  Review of Systems   Review of Systems  All other systems reviewed and are negative.   Physical Exam Updated Vital Signs BP (!) 145/99 (BP Location: Right Arm)   Pulse 86   Temp 98.4 F (36.9 C) (Oral)    Resp 16   SpO2 100%   Physical Exam Vitals and nursing note reviewed.  Constitutional:      General: She is not in acute distress.    Appearance: She is well-developed.  HENT:     Head: Normocephalic and atraumatic.     Mouth/Throat:     Comments: Minimal oropharyngeal erythema, no exudates, no abscess, uvula is midline, airway intact, no stridor Eyes:     Conjunctiva/sclera: Conjunctivae normal.  Cardiovascular:     Rate and Rhythm: Normal rate and regular rhythm.     Heart sounds: No murmur.  Pulmonary:     Effort: Pulmonary effort is normal. No respiratory distress.     Breath sounds: Normal breath sounds.     Comments: Lungs are clear to auscultation bilaterally Abdominal:     Palpations: Abdomen is soft.     Tenderness: There is no abdominal tenderness.  Musculoskeletal:     Cervical back: Neck supple.  Skin:    General: Skin is warm and dry.  Neurological:     Mental Status: She is alert.     ED Results / Procedures / Treatments   Labs (all labs ordered are listed, but only abnormal results are displayed) Labs Reviewed  GROUP A STREP BY PCR    EKG None  Radiology No results found.  Procedures Procedures (including critical care time)  Medications Ordered in ED Medications - No data to display  ED Course  I have reviewed the triage vital signs and the nursing notes.  Pertinent labs & imaging results that were available during my care of the patient were reviewed by me and considered in my medical decision making (see chart for details).    MDM Rules/Calculators/A&P                      Patient with URI symptoms.  She is afebrile.  O2 saturation is normal.  I doubt Covid.  Strep test is negative.  Encouraged OTC treatment with antihistamines and Tylenol/ibuprofen.   Final Clinical Impression(s) / ED Diagnoses Final diagnoses:  Viral upper respiratory tract infection    Rx / DC Orders ED Discharge Orders    None       Montine Circle,  PA-C 03/14/19 0031    Fatima Blank, MD 03/14/19 0501

## 2019-03-14 NOTE — ED Notes (Signed)
Pt complaining of sore throat for the past week. Pt endorses "I think it's strep throat. Denies n/v/d, f/c, cough and headache.

## 2019-03-14 NOTE — Discharge Instructions (Signed)
Take benadryl and ibuprofen/tylenol for your symptoms.  Your strep test was negative.  If you run a fever, you should be tested for COVID-19.

## 2019-03-15 ENCOUNTER — Ambulatory Visit: Payer: Medicaid Other

## 2019-03-15 ENCOUNTER — Telehealth: Payer: Self-pay | Admitting: Internal Medicine

## 2019-03-15 NOTE — Telephone Encounter (Signed)
No entry 

## 2019-03-15 NOTE — Telephone Encounter (Signed)
Rt call to pt, made appt in acc

## 2019-03-15 NOTE — Telephone Encounter (Signed)
Pt went to ER Monday has a sore throat; pt contact [phone]

## 2019-03-16 ENCOUNTER — Ambulatory Visit (INDEPENDENT_AMBULATORY_CARE_PROVIDER_SITE_OTHER): Payer: Medicaid Other | Admitting: Internal Medicine

## 2019-03-16 ENCOUNTER — Ambulatory Visit: Payer: Medicaid Other | Admitting: Obstetrics & Gynecology

## 2019-03-16 ENCOUNTER — Encounter: Payer: Self-pay | Admitting: Internal Medicine

## 2019-03-16 ENCOUNTER — Other Ambulatory Visit: Payer: Self-pay

## 2019-03-16 ENCOUNTER — Telehealth: Payer: Self-pay | Admitting: *Deleted

## 2019-03-16 VITALS — BP 151/134 | HR 93 | Temp 98.2°F | Ht 64.0 in | Wt 298.2 lb

## 2019-03-16 DIAGNOSIS — Z23 Encounter for immunization: Secondary | ICD-10-CM | POA: Diagnosis not present

## 2019-03-16 DIAGNOSIS — R0982 Postnasal drip: Secondary | ICD-10-CM

## 2019-03-16 DIAGNOSIS — J029 Acute pharyngitis, unspecified: Secondary | ICD-10-CM

## 2019-03-16 DIAGNOSIS — R05 Cough: Secondary | ICD-10-CM

## 2019-03-16 DIAGNOSIS — R059 Cough, unspecified: Secondary | ICD-10-CM

## 2019-03-16 MED ORDER — BENZONATATE 100 MG PO CAPS: 100.0000 mg | ORAL_CAPSULE | Freq: Four times a day (QID) | ORAL | 1 refills | Status: DC | PRN

## 2019-03-16 MED ORDER — FLUTICASONE PROPIONATE 50 MCG/ACT NA SUSP: 2.0000 | Freq: Every day | NASAL | 0 refills | Status: DC

## 2019-03-16 NOTE — Telephone Encounter (Signed)
Pt calls and states she missed her appt today at [facility] for removal/ new placement of nexplannon. Wants to know if the md here in clinic will do, nurse informed her it was doubtful but that when she comes to her appt today to discuss with md. Informed her the best plan would be to call every morning to [facility] and see if anyone had cancelled and if they do ask for the appt. She stated that was too much to ask of her it would bother her nerves. Informed her to wait until the next appt they gave her in feb then.

## 2019-03-16 NOTE — Patient Instructions (Addendum)
For your sore throat, it is most likely a combination of allergies, your cold last week and a possible stone in your tonsil.  Unfortunately, those stones usually just have to work their way out however I would like you to continue to do salt water garggling in the mean time. I think that something like Flonase may also help with your sore throat as it is likely that you have some post nasal drainage. I also sent in some tessalon pearls which may help with your cough at night. Finally, I placed a referral to the ear, nose and throat doctor.

## 2019-03-16 NOTE — Assessment & Plan Note (Addendum)
Patient presenting to the clinic today for evaluation of a sore throat. Was recently in the ED at which time she tested negative for strep. Continues to endorse a post nasal drip and dry cough. Denies fever, chills, shortness of breath or other URI sx.  She does endorse a long history of 1-3x yearly strep pharyngitis infections. Remains afebrile at today's visit. PE significant for bilateral tonsilar hypertrophy without erythema or exudates. There is a small stone present on the anterior aspect of the right tonsil.   Assessment: likely multifactorial--allergies, post nasal drip, post viral, tonsilith  Plan: discussed continued conservative management. Also placed ENT referral for further evaluation. Encouraged the use of steroid nasal spray and salt water gargles. Tessalon pearls for cough. Encouraged her to undergo COVID testing as well however pt refused.

## 2019-03-17 NOTE — Progress Notes (Signed)
   CC: sore throat  HPI:  [name] is a 44 y.o. female who presents for Please see problem based assessment and plan for additional details.     Past Medical History:  Diagnosis Date  . Carpal tunnel syndrome    right  . 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 Family history of prostate cancer   . GERD (gastroesophageal reflux disease)   . Headache(784.0)    migraines or sinus  . History of vertigo    states comes and goes; no current med.  . Obesity   . Osteoarthritis   . PONV (postoperative nausea and vomiting)   . Rash 08/10/2013   right thigh  . Sleep apnea    does not wear cpap    Review of Systems:  Review of Systems - General ROS: negative for - chills or fever ENT ROS: positive for - sore throat negative for - headaches Respiratory ROS: positive for - cough negative for - shortness of breath   Physical Exam:  Vitals:   03/16/19 1525 03/16/19 1539  BP: (!) 148/111 (!) 151/134  Pulse: (!) 118 93  Temp: 98.2 F (36.8 C)   TempSrc: Oral   SpO2: 100%   Weight: 298 lb 3.2 oz (135.3 kg)   Height: '5\' 4"'$  (1.626 m)     GENERAL: well appearing, in no apparent distress HEENT: no pharyngeal exudate on erythema. tonsilar hypertrophy present. White appearing stone in the anterior aspect of the right tonsil. Airway patent. CARDIAC: heart regular rate and rhythm PULMONARY: lung sounds clear to auscultation   Assessment & Plan:   See Encounters Tab for problem based charting.  Pertinent labs & imaging results that were available during my care of the patient were reviewed by me and considered in my medical decision making  Patient is in agreement with the plan and endorses no further questions at this time.  Patient discussed with Dr. Adolm Joseph, MD Internal Medicine Resident-PGY1 03/17/19

## 2019-03-21 NOTE — Progress Notes (Signed)
Internal Medicine Clinic Attending  Case discussed with Dr. Christian at the time of the visit.  We reviewed the resident's history and exam and pertinent patient test results.  I agree with the assessment, diagnosis, and plan of care documented in the resident's note.    

## 2019-03-22 ENCOUNTER — Telehealth: Payer: Self-pay | Admitting: Obstetrics & Gynecology

## 2019-03-22 NOTE — Telephone Encounter (Signed)
Received a call from Ms. Carriveau stating her doctor had called her to get her an appointment. I informed her we had her scheduled for 02/19 with Dr. Debroah Loop, She said no, I'm suppose to have something sooner. I offered her 01/28 at 10:30 am. She could not do this appointment. I offered her 01/15 at 8:15, or 1:15pm, and she again said no. She asked me to schedule them and then she would let me know which one she could do. I told her I could schedule her for tomorrow if she'd like, and give her a note for work. However, I could not hold two appointments. She stated she was going to call her doctor's office, and then she hung up the phone.

## 2019-03-24 ENCOUNTER — Telehealth: Payer: Self-pay | Admitting: *Deleted

## 2019-03-24 ENCOUNTER — Encounter: Payer: Self-pay | Admitting: *Deleted

## 2019-03-24 NOTE — Telephone Encounter (Signed)
RTC to patient concerning appointment for Removal of Contraception tubes in arm.  Patient was contacted by [facility] as was told to her by our office. Patient was offered appointments per the GYN office and did not take/ keep them per the scheduler at [facility].  Patient was given another appointment in February. Patient stated that she would begin to bleed heavily if they are not removed from her arm.. Patient stated that she was tired of people saying things that were not true about the appointments.  Patient stated we were wasting her time if we could not get her an earlier appointment.  She then abruptly hung up the phone.  Angelina Ok, RN 03/24/2019 11:57 AM

## 2019-03-24 NOTE — Telephone Encounter (Signed)
Phone note opened in error- no call made or received at this time. Dorie Rank, RN, 03/24/19, 7:55A

## 2019-03-27 ENCOUNTER — Other Ambulatory Visit: Payer: Self-pay

## 2019-03-27 ENCOUNTER — Encounter: Payer: Self-pay | Admitting: Advanced Practice Midwife

## 2019-03-27 ENCOUNTER — Ambulatory Visit (INDEPENDENT_AMBULATORY_CARE_PROVIDER_SITE_OTHER): Payer: Medicaid Other | Admitting: Advanced Practice Midwife

## 2019-03-27 VITALS — BP 125/87 | HR 96 | Wt 290.0 lb

## 2019-03-27 DIAGNOSIS — Z3046 Encounter for surveillance of implantable subdermal contraceptive: Secondary | ICD-10-CM

## 2019-03-27 DIAGNOSIS — Z30017 Encounter for initial prescription of implantable subdermal contraceptive: Secondary | ICD-10-CM

## 2019-03-27 LAB — POCT PREGNANCY, URINE: Preg Test, Ur: NEGATIVE

## 2019-03-27 NOTE — Progress Notes (Signed)
GYNECOLOGY OFFICE PROCEDURE NOTE  Dawn Galvan is a 43 y.o. [id number] here for Nexplanon removal and Nexplanon insertion.  Last pap smear was on 2013 and was normal.  No other gynecologic concerns. Patient has been happy with this birth control method. She saw her PCP last week and had tdap at that visit. Nexplanon was placed 03/09/2016 in our office. UPT NEGATIVE today    Nexplanon Removal and Insertion  Patient identified, informed consent performed, consent signed.   Patient does understand that irregular bleeding is a very common side effect of this medication. She was advised to have backup contraception for one week after replacement of the implant. Pregnancy test in clinic today was negative.  Appropriate time out taken. Implanon site identified. Area prepped in usual sterile fashon. One ml of 1% lidocaine was used to anesthetize the area at the distal end of the implant. A small stab incision was made right beside the implant on the distal portion. The Nexplanon rod was grasped using hemostats and removed without difficulty. There was minimal blood loss. There were no complications. Area was then injected with 3 ml of 1 % lidocaine. She was re-prepped with betadine, Nexplanon removed from packaging, Device confirmed in needle, then inserted full length of needle and withdrawn per handbook instructions. Nexplanon was able to palpated in the patient's arm; patient palpated the insert herself.  There was minimal blood loss. Patient insertion site covered with guaze and a pressure bandage to reduce any bruising. The patient tolerated the procedure well and was given post procedure instructions.  She was advised to have backup contraception for one week.     Patient is due for a pap. She thought she had one at her PCP office recently, but the last pap noted in Epic was 2013. Patient advised to schedule pap/annual with our office if desired or with PCP ASAP.    Thressa Sheller DNP, CNM  03/27/19  3:51 PM

## 2019-03-29 DIAGNOSIS — Z1589 Genetic susceptibility to other disease: Secondary | ICD-10-CM | POA: Diagnosis not present

## 2019-03-29 DIAGNOSIS — Z6841 Body Mass Index (BMI) 40.0 and over, adult: Secondary | ICD-10-CM | POA: Diagnosis not present

## 2019-03-29 DIAGNOSIS — Z1501 Genetic susceptibility to malignant neoplasm of breast: Secondary | ICD-10-CM | POA: Diagnosis not present

## 2019-03-29 DIAGNOSIS — N62 Hypertrophy of breast: Secondary | ICD-10-CM | POA: Diagnosis not present

## 2019-03-29 DIAGNOSIS — Z1509 Genetic susceptibility to other malignant neoplasm: Secondary | ICD-10-CM | POA: Diagnosis not present

## 2019-04-05 DIAGNOSIS — J353 Hypertrophy of tonsils with hypertrophy of adenoids: Secondary | ICD-10-CM | POA: Diagnosis not present

## 2019-04-05 DIAGNOSIS — H6121 Impacted cerumen, right ear: Secondary | ICD-10-CM | POA: Diagnosis not present

## 2019-04-05 DIAGNOSIS — J3503 Chronic tonsillitis and adenoiditis: Secondary | ICD-10-CM | POA: Diagnosis not present

## 2019-04-14 ENCOUNTER — Ambulatory Visit: Payer: Medicaid Other | Admitting: Obstetrics & Gynecology

## 2019-04-25 ENCOUNTER — Ambulatory Visit: Payer: Medicaid Other | Admitting: Nurse Practitioner

## 2019-04-26 ENCOUNTER — Telehealth: Payer: Self-pay | Admitting: Internal Medicine

## 2019-04-26 DIAGNOSIS — F329 Major depressive disorder, single episode, unspecified: Secondary | ICD-10-CM | POA: Diagnosis not present

## 2019-04-26 DIAGNOSIS — Z9884 Bariatric surgery status: Secondary | ICD-10-CM | POA: Diagnosis not present

## 2019-04-26 DIAGNOSIS — Z6841 Body Mass Index (BMI) 40.0 and over, adult: Secondary | ICD-10-CM | POA: Diagnosis not present

## 2019-04-26 DIAGNOSIS — Z713 Dietary counseling and surveillance: Secondary | ICD-10-CM | POA: Diagnosis not present

## 2019-04-27 NOTE — Telephone Encounter (Signed)
Pt requesting a new referral to a different ENT.  Patient previously seen @ Dr. Suszanne Conners office and does not want to go back. Requested office visit notes and they have been placed in your box.  Please advise if a new Referral can be placed.

## 2019-04-28 ENCOUNTER — Telehealth: Payer: Self-pay

## 2019-04-28 NOTE — Telephone Encounter (Signed)
Received TC from patient, she states she was referred to ENT , Dr. Suszanne Conners and does not like him.  Pt refuses to see him again and would like another referral placed to a different ENT/practice.  Referral was placed from 03/16/19 office visit with Dr. Ephriam Knuckles for post nasal drip.  Pt also wants to know if a RX can be sent for a waterpik.  Pt is aware waterpiks can be purchased OTC, but states she doesn't want to pay for it.  SChaplin, RN,BSN

## 2019-05-01 NOTE — Telephone Encounter (Signed)
To my knowledge, no, because it is OTC.  If the ENT suggest she have this, I am sure a RX would require a PA which the ENT would need to fill out as we would not have the information to complete the PA.   SChaplin, RN,BSN

## 2019-05-03 NOTE — Telephone Encounter (Signed)
Pt calling back to inquire about a new Referral Request sent on 04/27/2019 to a different ENT.  Pt states she can not afford a Waterpick and Medicaid will not pay for it.  The patient is wanting to know can she be referred to a different office as soon as possible.  Please advise.

## 2019-05-03 NOTE — Telephone Encounter (Signed)
Pt will need a referral sent over due to Medicaid.  Will forward your current referral to the [facility].  Pt has been notified.

## 2019-05-03 NOTE — Telephone Encounter (Signed)
The referral should work for whatever office she wants right? It could just be printed and given to her. She can call and go to an office of her choosing. Whether or not insurance will cover that particular office or how much the insurance will cover will have to be her responsibility to look into but the order for referral should work if insurance is requiring it.

## 2019-05-03 NOTE — Telephone Encounter (Deleted)
Hi Chilon, so was a new referral already placed on 3/4? Or she saw the ENT that she does not want to return to on 3/4. I'm ok with her going to a different ENT if she prefers. Just let me know what needs to be done. Thanks!

## 2019-05-04 ENCOUNTER — Encounter: Payer: Self-pay | Admitting: General Practice

## 2019-05-04 DIAGNOSIS — Z1589 Genetic susceptibility to other disease: Secondary | ICD-10-CM | POA: Diagnosis not present

## 2019-05-04 DIAGNOSIS — Z1501 Genetic susceptibility to malignant neoplasm of breast: Secondary | ICD-10-CM | POA: Diagnosis not present

## 2019-05-04 DIAGNOSIS — Z1509 Genetic susceptibility to other malignant neoplasm: Secondary | ICD-10-CM | POA: Diagnosis not present

## 2019-05-04 NOTE — Telephone Encounter (Signed)
Previous referral was forwarded to the new ENT office of [facility].  No need to place a new Referral.

## 2019-05-04 NOTE — Telephone Encounter (Signed)
Ok. So do I need to do anything?

## 2019-05-04 NOTE — Telephone Encounter (Signed)
Thank you :)

## 2019-05-22 DIAGNOSIS — K219 Gastro-esophageal reflux disease without esophagitis: Secondary | ICD-10-CM | POA: Diagnosis not present

## 2019-05-22 DIAGNOSIS — J029 Acute pharyngitis, unspecified: Secondary | ICD-10-CM

## 2019-05-22 DIAGNOSIS — J343 Hypertrophy of nasal turbinates: Secondary | ICD-10-CM | POA: Diagnosis not present

## 2019-05-22 DIAGNOSIS — J358 Other chronic diseases of tonsils and adenoids: Secondary | ICD-10-CM | POA: Diagnosis not present

## 2019-05-22 HISTORY — DX: Acute pharyngitis, unspecified: J02.9

## 2019-05-29 ENCOUNTER — Encounter: Payer: Self-pay | Admitting: Nurse Practitioner

## 2019-05-29 ENCOUNTER — Ambulatory Visit (INDEPENDENT_AMBULATORY_CARE_PROVIDER_SITE_OTHER): Payer: Medicaid Other | Admitting: Nurse Practitioner

## 2019-05-29 ENCOUNTER — Other Ambulatory Visit: Payer: Self-pay

## 2019-05-29 ENCOUNTER — Other Ambulatory Visit (HOSPITAL_COMMUNITY)
Admission: RE | Admit: 2019-05-29 | Discharge: 2019-05-29 | Disposition: A | Discharge status: Home / Self Care | Payer: Medicaid Other | Source: Ambulatory Visit | Attending: Nurse Practitioner | Admitting: Nurse Practitioner

## 2019-05-29 VITALS — BP 119/83 | HR 97 | Temp 98.5°F | Wt 287.0 lb

## 2019-05-29 DIAGNOSIS — Z01419 Encounter for gynecological examination (general) (routine) without abnormal findings: Secondary | ICD-10-CM | POA: Diagnosis not present

## 2019-05-29 DIAGNOSIS — Z9884 Bariatric surgery status: Secondary | ICD-10-CM

## 2019-05-29 DIAGNOSIS — Z0000 Encounter for general adult medical examination without abnormal findings: Secondary | ICD-10-CM | POA: Diagnosis not present

## 2019-05-29 DIAGNOSIS — Z975 Presence of (intrauterine) contraceptive device: Secondary | ICD-10-CM | POA: Diagnosis not present

## 2019-05-29 DIAGNOSIS — Z803 Family history of malignant neoplasm of breast: Secondary | ICD-10-CM | POA: Diagnosis not present

## 2019-05-29 NOTE — Progress Notes (Signed)
GYNECOLOGY ANNUAL PREVENTATIVE CARE ENCOUNTER NOTE  Subjective:   Dawn Galvan is a 44 y.o. [id number] female here for a routine annual gynecologic exam.  Current complaints: last pap was 2013.   Denies abnormal vaginal bleeding, discharge, pelvic pain, problems with intercourse or other gynecologic concerns.    Gynecologic History No LMP recorded. Patient has had an implant. Contraception: Nexplanon Last Pap: 2013  Results were: normal Last mammogram: 12-2018. Results were: normal  Obstetric History OB History  Gravida Para Term Preterm AB Living  '2 2 2 ' 0 0 2  SAB TAB Ectopic Multiple Live Births  0 0 0 0      # Outcome Date GA Lbr Len/2nd Weight Sex Delivery Anes PTL Lv  2 Term 12/24/03    Jerilynn Mages CS-LTranv     1 Term 03/13/00    F Vag-Spont       Past Medical History:  Diagnosis Date  . Carpal tunnel syndrome    right  . 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 Family history of prostate cancer   . GERD (gastroesophageal reflux disease)   . Headache(784.0)    migraines or sinus  . History of vertigo    states comes and goes; no current med.  . Obesity   . Osteoarthritis   . PONV (postoperative nausea and vomiting)   . Rash 08/10/2013   right thigh  . Sleep apnea    does not wear cpap    Past Surgical History:  Procedure Laterality Date  . CARPAL TUNNEL RELEASE Left 08/18/2013   Procedure: LEFT CARPAL TUNNEL RELEASE;  Surgeon: Tennis Must, MD;  Location: [place];  Service: Orthopedics;  Laterality: Left;  . CARPAL TUNNEL RELEASE Right 04/29/2017   Procedure: RIGHT CARPAL TUNNEL RELEASE;  Surgeon: Leanora Cover, MD;  Location: [place];  Service: Orthopedics;  Laterality: Right;  . CESAREAN SECTION    . CHOLECYSTECTOMY    . HERNIA REPAIR     x 2  . HYSTEROPLASTY REPAIR OF UTERINE ANOMALY    . HYSTEROSCOPY WITH D & C  01/13/2012   Procedure: DILATATION AND CURETTAGE /HYSTEROSCOPY;  Surgeon: Osborne Oman, MD;  Location: [facility];  Service: Gynecology;  Laterality: N/A;  Pap smear  . Sharon RESECTION  2016  . TOE SURGERY Left    second toe. rod placed  . WISDOM TOOTH EXTRACTION  11/20/10    Current Outpatient Medications on File Prior to Visit  Medication Sig Dispense Refill  . omeprazole (PRILOSEC) 40 MG capsule Take 40 capsules by mouth daily.    . phentermine 37.5 MG capsule Take 37.5 mg by mouth every morning.    . benzonatate (TESSALON PERLES) 100 MG capsule Take 1 capsule (100 mg total) by mouth every 6 (six) hours as needed for cough. (Patient not taking: Reported on 05/29/2019) 30 capsule 1  . fluticasone (FLONASE) 50 MCG/ACT nasal spray Place 2 sprays into both nostrils daily. (Patient not taking: Reported on 05/29/2019) 16 g 0  . LORazepam (ATIVAN) 1 MG tablet Take 1 mg by mouth every 6 (six) hours as needed.    . Probiotic Product (PROBIOTIC DAILY PO) Take 1 capsule by mouth daily.    . sertraline (ZOLOFT) 50 MG tablet TAKE 1 TABLET BY MOUTH EVERY DAY (Patient not taking: Reported on 05/29/2019) 30 tablet 2   No current facility-administered medications on file prior to visit.  No Known Allergies  Social History   Socioeconomic History  . Marital status: Single    Spouse name: Not on file  . Number of children: Not on file  . Years of education: Not on file  . Highest education level: Not on file  Occupational History  . Not on file  Tobacco Use  . Smoking status: Former Smoker    Years: 0.00    Quit date: 02/22/2009    Years since quitting: 10.2  . Smokeless tobacco: Never Used  Substance and Sexual Activity  . Alcohol use: Yes    Comment: Social  . Drug use: No  . Sexual activity: Not Currently    Birth control/protection: Implant    Comment: never' \smoke'  routinue bases  Other Topics Concern  . Not on file  Social History Narrative  . Not on file   Social Determinants of Health   Financial Resource Strain:   . Difficulty of Paying  Living Expenses:   Food Insecurity:   . Worried About Charity fundraiser in the Last Year:   . Arboriculturist in the Last Year:   Transportation Needs:   . Film/video editor (Medical):   Marland Kitchen Lack of Transportation (Non-Medical):   Physical Activity:   . Days of Exercise per Week:   . Minutes of Exercise per Session:   Stress:   . Feeling of Stress :   Social Connections:   . Frequency of Communication with Friends and Family:   . Frequency of Social Gatherings with Friends and Family:   . Attends Religious Services:   . Active Member of Clubs or Organizations:   . Attends Archivist Meetings:   Marland Kitchen Marital Status:   Intimate Partner Violence:   . Fear of Current or Ex-Partner:   . Emotionally Abused:   Marland Kitchen Physically Abused:   . Sexually Abused:     Family History  Problem Relation Age of Onset  . Hypertension Mother   . Breast cancer Mother   . Hypertension Father   . Diabetes Maternal Grandmother   . Diabetes Paternal Grandmother     The following portions of the patient's history were reviewed and updated as appropriate: allergies, current medications, past family history, past medical history, past social history, past surgical history and problem list.  Review of Systems Pertinent items noted in HPI and remainder of comprehensive ROS otherwise negative.   Objective:  BP 119/83   Pulse 97   Temp 98.5 F (36.9 C)   Wt 287 lb (130.2 kg)   BMI 49.26 kg/m  CONSTITUTIONAL: Well-developed, well-nourished female in no acute distress.  HENT:  Normocephalic, atraumatic, External right and left ear normal.  EYES: Conjunctivae and EOM are normal. Pupils are equal, round.  No scleral icterus.  NECK: Normal range of motion, supple, no masses.  Normal thyroid.  SKIN: Skin is warm and dry. No rash noted. Not diaphoretic. No erythema. No pallor. NEUROLOGIC: Alert and oriented to person, place, and time. Normal reflexes, muscle tone coordination. No cranial nerve  deficit noted. PSYCHIATRIC: Normal mood and affect. Normal behavior. Normal judgment and thought content. CARDIOVASCULAR: Normal heart rate noted, regular rhythm RESPIRATORY: Clear to auscultation bilaterally. Effort and breath sounds normal, no problems with respiration noted. BREASTS: Symmetric in size. No masses, skin changes, nipple drainage, or lymphadenopathy. ABDOMEN: Soft, no distention noted.  No tenderness, rebound or guarding.  PELVIC: Normal appearing external genitalia; normal appearing vaginal mucosa and cervix.  No abnormal discharge noted.  Pap smear obtained.  Normal uterine size, no other palpable masses, no uterine or adnexal tenderness. MUSCULOSKELETAL: Normal range of motion. No tenderness.  No cyanosis, clubbing, or edema.    Assessment and Plan:  1. Nexplanon in place Is very pleased with Nexplanon.    2. Well woman exam with routine gynecological exam Pap smear done today [facility]  3. Morbid obesity ([place]) Has had gastric sleeve and has lost more than 100 pounds.   Works out at [employer]. Has specific plan for healthy intake and is slowly continuing to lose weight.  4. Family History of Breast Cancer Is followed every 6 months by the breast center as she is positive for the braca gene  Will follow up results of pap smear and manage accordingly. Routine preventative health maintenance measures emphasized. Please refer to After Visit Summary for other counseling recommendations.    Earlie Server, RN, MSN, NP-BC Nurse Practitioner, [place] for [facility]

## 2019-05-31 LAB — CYTOLOGY - PAP
Comment: NEGATIVE
Diagnosis: NEGATIVE
Diagnosis: REACTIVE
High risk HPV: NEGATIVE

## 2019-06-01 MED ORDER — METRONIDAZOLE 500 MG PO TABS: 500.0000 mg | ORAL_TABLET | Freq: Two times a day (BID) | ORAL | 0 refills | Status: DC

## 2019-06-01 NOTE — Addendum Note (Signed)
Addended by: Currie Paris on: 06/01/2019 10:10 AM   Modules accepted: Orders

## 2019-06-05 ENCOUNTER — Telehealth: Payer: Self-pay | Admitting: *Deleted

## 2019-06-05 ENCOUNTER — Telehealth: Payer: Self-pay

## 2019-06-05 MED ORDER — FLUCONAZOLE 150 MG PO TABS: 150.0000 mg | ORAL_TABLET | Freq: Once | ORAL | 0 refills | Status: AC

## 2019-06-05 NOTE — Telephone Encounter (Signed)
Pt left VM message as a follow up to the call she received earlier regarding test results of +Trichomonas. She stated that if the medication prescribed is an antibiotic, she will need Rx for Diflucan as she always gets vaginal yeast following any antibiotics. Rx sent in per standing order. Pt was called and notified of Rx sent. She voiced understanding.

## 2019-06-05 NOTE — Telephone Encounter (Signed)
Called pt to advise her of test results of Pap Smear, explained that she tested positive with Trich. Patient was a little weary because her partner was a female. Advised still can get STDs orally,advised Rx was sent to pharmacy on file & that partner needs to get tested & started Tx also & no sexual contact for 2 weeks after last person is Tx. Patient verbalized understanding.

## 2019-06-06 ENCOUNTER — Ambulatory Visit: Payer: Medicaid Other | Admitting: Oncology

## 2019-06-06 ENCOUNTER — Encounter: Payer: Self-pay | Admitting: Oncology

## 2019-06-06 NOTE — Telephone Encounter (Signed)
Pt left MyChart message & also called.  Unable to make appt today.  Needs to r/s.  States she can't do mornings.  Message to scheduler to r/s pt.

## 2019-06-25 NOTE — Progress Notes (Signed)
[place]  Telephone:(336) [phone] Fax:(336) [phone]    ID: Dawn Galvan DOB: [birth year]  MR#: [id number]  RWE#:[id number]  Patient Care Team: Dawn Bison, DO as PCP - General (Internal Medicine) Dawn Galvan, Dawn Dad, MD as Consulting Physician (Oncology) OTHER MD:   CHIEF COMPLAINT: PALB 2 Mutation, morbid obesity  CURRENT TREATMENT: intensified screening while definitive surgery pending   INTERVAL HISTORY: Dawn Galvan returns today for follow up of her PALB2 mutation. She was evaluated in the high risk clinic on 10/05/2018.   Since her last visit, she underwent breast MRI on 01/10/2019 showing: breast composition A; no evidence of malignancy in either breast.  She met with the plastic surgery group at [facility]. She does not describe it as a good experience. They were discouraging and told her that she might never be a candidate for reconstruction but that at a minimum she would have to get a BMI less than 30.  This suggested she proceed to bilateral mastectomies for risk reduction and wait on reconstruction later  REVIEW OF SYSTEMS: Dawn Galvan has been weighing herself at home and has been gradually losing weight.  She did go through a gastric sleeve and lost about 100 pounds, then gained about 30, and now she is working with a Clinical research associate and also trying to follow some nutritional guidelines.  She is down to 287 pounds from 332 September 2019.  A detailed review of systems today was otherwise stable   HISTORY OF CURRENT ILLNESS: From the original intake note:  Dawn Galvan is a 44 y.o. 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 She was originally under the care of Dr. Lindi Galvan, but the patient requested a second opinion. Dawn Galvan's mother, Dawn Galvan, was also diagnosed with breast cancer related to PALB 2 mutation.  [name] opted for intensified screening but passed on antiestrogens. Because of this history, Dawn Galvan also underwent genetic testing  and it returned positive for the PALB 2 mutation in July 2019.  She also decided against antiestrogens.   PAST MEDICAL HISTORY: Past Medical History:  Diagnosis Date  . Carpal tunnel syndrome    right  . 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 Family history of prostate cancer   . GERD (gastroesophageal reflux disease)   . Headache(784.0)    migraines or sinus  . History of vertigo    states comes and goes; no current med.  . Obesity   . Osteoarthritis   . PONV (postoperative nausea and vomiting)   . Rash 08/10/2013   right thigh  . Sleep apnea    does not wear cpap    PAST SURGICAL HISTORY: Past Surgical History:  Procedure Laterality Date  . CARPAL TUNNEL RELEASE Left 08/18/2013   Procedure: LEFT CARPAL TUNNEL RELEASE;  Surgeon: Dawn Must, MD;  Location: [facility];  Service: Orthopedics;  Laterality: Left;  . CARPAL TUNNEL RELEASE Right 04/29/2017   Procedure: RIGHT CARPAL TUNNEL RELEASE;  Surgeon: Dawn Cover, MD;  Location: [facility];  Service: Orthopedics;  Laterality: Right;  . CESAREAN SECTION    . CHOLECYSTECTOMY    . HERNIA REPAIR     x 2  . HYSTEROPLASTY REPAIR OF UTERINE ANOMALY    . HYSTEROSCOPY WITH D & C  01/13/2012   Procedure: DILATATION AND CURETTAGE /HYSTEROSCOPY;  Surgeon: Dawn Oman, MD;  Location: [facility];  Service: Gynecology;  Laterality: N/A;  Pap smear  . [place] RESECTION  2016  . TOE SURGERY Left    second toe. rod placed  . WISDOM TOOTH EXTRACTION  11/20/10    FAMILY HISTORY: Family History  Problem Relation Age of Onset  . Hypertension Mother   . Breast cancer Mother   . Hypertension Father   . Diabetes Maternal Grandmother   . Diabetes Paternal Grandmother    As of August 2020 Dawn Galvan's father is in his 60's. Patients' mother is 28: She also carries a PALB2 mutation and has been diagnosed with breast cancer. The patient has 1 brother and 2 sisters,  none with cancer. Two of her maternal aunts (3 out of her mother's 8 sisters) had breast cancer. Patient denies anyone in her family having ovarian or pancreatic cancer.    GYNECOLOGIC HISTORY:  No LMP recorded. Patient has had an implant. Menarche: 44 years old Age at first live birth: 44 years old [name]: 2 LMP: unknown, uses implant birth control Contraceptive: implanon Hysterectomy?: no BSO?: no   SOCIAL HISTORY: (Current as of 06/26/2019) Dawn Galvan is a single. 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 She has two children: Dawn Galvan and Dawn Galvan. Dawn Galvan is 67; she is a Museum/gallery exhibitions officer at [employer] Dawn Galvan is 59, in 9th grade.  The patient does not belong to a church, synagogue, or mosque.    ADVANCED DIRECTIVES: Not in place    HEALTH MAINTENANCE: Social History   Tobacco Use  . Smoking status: Former Smoker    Years: 0.00    Quit date: 02/22/2009    Years since quitting: 10.3  . Smokeless tobacco: Never Used  Substance Use Topics  . Alcohol use: Yes    Comment: Social  . Drug use: No    Colonoscopy: Dr. Lyndel Galvan in [place]  [facility]  Bone density: none [facility]  No Known Allergies  Current Outpatient Medications  Medication Sig Dispense Refill  . LORazepam (ATIVAN) 1 MG tablet Take 1 mg by mouth every 6 (six) hours as needed.    . metroNIDAZOLE (FLAGYL) 500 MG tablet Take 1 tablet (500 mg total) by mouth 2 (two) times daily. No alcohol while taking this medication 14 tablet 0  . omeprazole (PRILOSEC) 40 MG capsule Take 40 capsules by mouth daily.    . phentermine 37.5 MG capsule Take 37.5 mg by mouth every morning.     No current facility-administered medications for this visit.    OBJECTIVE: African-American woman who appears stated age  There were no vitals filed for this visit.   There is no height or weight on file to calculate BMI.   Wt Readings from Last 3 Encounters:  05/29/19 287 lb (130.2 kg)    03/27/19 290 lb (131.5 kg)  03/16/19 298 lb 3.2 oz (135.3 kg)      ECOG FS:1 - Symptomatic but completely ambulatory  Sclerae unicteric, EOMs intact Wearing a mask No cervical or supraclavicular adenopathy Lungs no rales or rhonchi Heart regular rate and rhythm Abd soft, obese, nontender, positive bowel sounds MSK no focal spinal tenderness, no upper extremity lymphedema Neuro: nonfocal, well oriented, appropriate affect Breasts: No masses palpated in either breast.  Both axillae are benign3   LAB RESULTS:  CMP     Component Value Date/Time   NA 143 02/28/2016 1117   K 4.3 02/28/2016 1117   CL 104 02/28/2016 1117   CO2 22 02/28/2016 1117   GLUCOSE 92 02/28/2016 1117  GLUCOSE 100 (H) 08/18/2013 0957   BUN 9 02/28/2016 1117   CREATININE 0.75 02/28/2016 1117   CALCIUM 9.0 02/28/2016 1117   GFRNONAA 100 02/28/2016 1117   GFRAA 115 02/28/2016 1117    No results found for: TOTALPROTELP, ALBUMINELP, A1GS, A2GS, BETS, BETA2SER, GAMS, MSPIKE, SPEI  No results found for: KPAFRELGTCHN, LAMBDASER, KAPLAMBRATIO  Lab Results  Component Value Date   WBC 11.6 (H) 04/29/2017   HGB 12.9 04/29/2017   HCT 40.1 04/29/2017   MCV 89.1 04/29/2017   PLT 319 04/29/2017    '@LASTCHEMISTRY' @  No results found for: LABCA2  No components found for: [id number]  No results for input(s): INR in the last 168 hours.  No results found for: LABCA2  No results found for: KFE761  No results found for: YJW929  No results found for: VFM734  No results found for: CA2729  No components found for: HGQUANT  No results found for: CEA1 / No results found for: CEA1   No results found for: AFPTUMOR  No results found for: CHROMOGRNA  No results found for: PSA1  No visits with results within 3 Day(s) from this visit.  Latest known visit with results is:  Office Visit on 05/29/2019  Component Date Value Ref Range Status  . High risk HPV 05/29/2019 Negative   Final  . Adequacy 05/29/2019  Satisfactory for evaluation; transformation zone component PRESENT.   Final  . Diagnosis 05/29/2019 - Negative for Intraepithelial Lesions or Malignancy (NILM)   Final  . Diagnosis 05/29/2019 - Benign reactive/reparative changes   Final  . Microorganisms 05/29/2019 Trichomonas vaginalis present   Final  . Comment 05/29/2019 Normal Reference Range HPV - Negative   Final    (this displays the last labs from the last 3 days)  No results found for: TOTALPROTELP, ALBUMINELP, A1GS, A2GS, BETS, BETA2SER, GAMS, MSPIKE, SPEI (this displays SPEP labs)  No results found for: KPAFRELGTCHN, LAMBDASER, KAPLAMBRATIO (kappa/lambda light chains)  No results found for: HGBA, HGBA2QUANT, HGBFQUANT, HGBSQUAN (Hemoglobinopathy evaluation)   No results found for: LDH  No results found for: IRON, TIBC, IRONPCTSAT (Iron and TIBC)  No results found for: FERRITIN  Urinalysis    Component Value Date/Time   COLORURINE YELLOW 11/14/2017 1244   APPEARANCEUR Turbid (A) 02/28/2018 1712   LABSPEC 1.023 11/14/2017 1244   PHURINE 5.0 11/14/2017 1244   GLUCOSEU Negative 02/28/2018 [place] 11/14/2017 1244   BILIRUBINUR Negative 02/28/2018 1833   BILIRUBINUR Negative 02/28/2018 1712   KETONESUR 5 (A) 11/14/2017 1244   PROTEINUR Positive (A) 02/28/2018 1833   PROTEINUR 1+ (A) 02/28/2018 1712   PROTEINUR NEGATIVE 11/14/2017 1244   UROBILINOGEN 2.0 (A) 02/28/2018 1833   UROBILINOGEN 0.2 12/29/2012 0955   NITRITE Negative 02/28/2018 1833   NITRITE Negative 02/28/2018 1712   NITRITE NEGATIVE 11/14/2017 1244   LEUKOCYTESUR Large (3+) (A) 02/28/2018 1833   LEUKOCYTESUR 3+ (A) 02/28/2018 1712     STUDIES:  MRI results discussed with the patient  ELIGIBLE FOR AVAILABLE RESEARCH PROTOCOL: no   ASSESSMENT: 44 y.o. 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   (2) patient plans to undergo bilateral prophylactic mastectomies with reconstruction, delayed until BMI decreases  (3) intensified screening: Mammography in June, breast MRI November or December   PLAN: Jnai understands the need to lose some more weight if she wishes to have  reconstruction after mastectomy.  She is on a variable exercise program through a trainer.  She is counting calories carefully and most days she says she eats fewer calories than she is supposed to have.  I suggested also she avoid carbohydrates as much as possible and concentrate on vegetables and proteins.  In any case until she is able to undergo bilateral mastectomies the plan is for intensified screening.  She will have mammography in June and a breast MRI later in the year most likely in December.  Those orders have been entered.  She will return to see me July of next year after her June mammogram in 2022  She knows to call for any other issue that may develop before the next visit  Total encounter time 30 minutes.*    Saphira Lahmann, Dawn Dad, MD  06/26/19 4:53 PM Medical Oncology and Hematology [facility] [address] [place], [place] [address] Tel. [phone]    Fax. [phone]   I, Jacqualyn Posey am acting as a Education administrator for Chauncey Cruel, MD.   I, Lurline Del MD, have reviewed the above documentation for accuracy and completeness, and I agree with the above.

## 2019-06-26 ENCOUNTER — Inpatient Hospital Stay: Payer: Medicaid Other | Attending: Oncology | Admitting: Oncology

## 2019-06-26 ENCOUNTER — Other Ambulatory Visit: Payer: Self-pay

## 2019-06-26 VITALS — BP 136/84 | HR 79 | Temp 97.9°F | Wt 289.0 lb

## 2019-06-26 DIAGNOSIS — Z87891 Personal history of nicotine dependence: Secondary | ICD-10-CM | POA: Diagnosis not present

## 2019-06-26 DIAGNOSIS — Z1509 Genetic susceptibility to other malignant neoplasm: Secondary | ICD-10-CM | POA: Diagnosis not present

## 2019-06-26 DIAGNOSIS — Z1589 Genetic susceptibility to other disease: Secondary | ICD-10-CM

## 2019-06-26 DIAGNOSIS — Z1501 Genetic susceptibility to malignant neoplasm of breast: Secondary | ICD-10-CM | POA: Diagnosis not present

## 2019-06-26 DIAGNOSIS — Z803 Family history of malignant neoplasm of breast: Secondary | ICD-10-CM | POA: Diagnosis not present

## 2019-06-28 ENCOUNTER — Telehealth: Payer: Self-pay | Admitting: Oncology

## 2019-06-28 NOTE — Telephone Encounter (Signed)
Scheduled appts per 5/3 los. Pt confirmed appt date and time.  

## 2019-07-30 ENCOUNTER — Encounter (HOSPITAL_COMMUNITY): Payer: Self-pay

## 2019-07-30 ENCOUNTER — Ambulatory Visit (HOSPITAL_COMMUNITY)
Admission: EM | Admit: 2019-07-30 | Discharge: 2019-07-30 | Disposition: A | Discharge status: Home / Self Care | Payer: Medicaid Other | Attending: Physician Assistant | Admitting: Physician Assistant

## 2019-07-30 DIAGNOSIS — B373 Candidiasis of vulva and vagina: Secondary | ICD-10-CM | POA: Diagnosis not present

## 2019-07-30 DIAGNOSIS — B3731 Acute candidiasis of vulva and vagina: Secondary | ICD-10-CM

## 2019-07-30 MED ORDER — FLUCONAZOLE 150 MG PO TABS: ORAL_TABLET | ORAL | 1 refills | Status: DC

## 2019-07-30 NOTE — ED Provider Notes (Signed)
[place]    CSN: [id number] Arrival date & time: 07/30/19  1004      History   Chief Complaint Chief Complaint  Patient presents with  . vaginal irritation    HPI Dawn Galvan is a 44 y.o. female.   Pt had oral sex and used a vaginal wash.  Pt reports she is now irritated and itching   The history is provided by the patient. No language interpreter was used.  Vaginal Discharge Quality:  Unable to specify Severity:  Unable to specify Duration:  2 days Progression:  Worsening Relieved by:  Nothing Worsened by:  Nothing Ineffective treatments:  None tried Associated symptoms: vaginal itching   Associated symptoms: no rash   Risk factors: no unprotected sex     Past Medical History:  Diagnosis Date  . Carpal tunnel syndrome    right  . 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 Family history of prostate cancer   . GERD (gastroesophageal reflux disease)   . Headache(784.0)    migraines or sinus  . History of vertigo    states comes and goes; no current med.  . Obesity   . Osteoarthritis   . PONV (postoperative nausea and vomiting)   . Rash 08/10/2013   right thigh  . Sleep apnea    does not wear cpap    Patient Active Problem List   Diagnosis Date Noted  . Nexplanon in place 05/29/2019  . At high risk for breast cancer 10/05/2018  . 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 Genetic testing 09/10/2017  . Family history of breast cancer   . Family history of prostate cancer   . Abnormal chest x-ray 03/24/2017  . Pharyngitis 07/14/2016  . Heel pain 06/25/2016  . Urinary frequency 02/28/2016  . S/P bariatric surgery 09/04/2015  . Depression 08/05/2015  . Muscle spasm of back 08/05/2015  . Carpal tunnel syndrome 08/05/2015  . Prediabetes 06/08/2014  . Morbid obesity ([place]) 06/12/2011    Past Surgical History:  Procedure  Laterality Date  . CARPAL TUNNEL RELEASE Left 08/18/2013   Procedure: LEFT CARPAL TUNNEL RELEASE;  Surgeon: Tennis Must, MD;  Location: [facility];  Service: Orthopedics;  Laterality: Left;  . CARPAL TUNNEL RELEASE Right 04/29/2017   Procedure: RIGHT CARPAL TUNNEL RELEASE;  Surgeon: Leanora Cover, MD;  Location: [facility];  Service: Orthopedics;  Laterality: Right;  . CESAREAN SECTION    . CHOLECYSTECTOMY    . HERNIA REPAIR     x 2  . HYSTEROPLASTY REPAIR OF UTERINE ANOMALY    . HYSTEROSCOPY WITH D & C  01/13/2012   Procedure: DILATATION AND CURETTAGE /HYSTEROSCOPY;  Surgeon: Osborne Oman, MD;  Location: [facility];  Service: Gynecology;  Laterality: N/A;  Pap smear  . [place] RESECTION  2016  . TOE SURGERY Left    second toe. rod placed  . WISDOM TOOTH EXTRACTION  11/20/10    OB History    Gravida  2   Para  2   Term  2   Preterm  0   AB  0   Living  2     SAB  0   TAB  0   Ectopic  0   Multiple  0   Live Births  Home Medications    Prior to Admission medications   Medication Sig Start Date End Date Taking? Authorizing Provider  LORazepam (ATIVAN) 1 MG tablet Take 1 mg by mouth every 6 (six) hours as needed. 01/02/19   [provider]  metroNIDAZOLE (FLAGYL) 500 MG tablet Take 1 tablet (500 mg total) by mouth 2 (two) times daily. No alcohol while taking this medication 06/01/19   Virginia Rochester, NP  omeprazole (PRILOSEC) 40 MG capsule Take 40 capsules by mouth daily. 05/22/19   [provider]  phentermine 37.5 MG capsule Take 37.5 mg by mouth every morning.    [provider]    Family History Family History  Problem Relation Age of Onset  . Hypertension Mother   . Breast cancer Mother   . Hypertension Father   . Diabetes Maternal Grandmother   . Diabetes Paternal Grandmother     Social History Social History   Tobacco Use  . Smoking status: Former Smoker    Years: 0.00    Quit date: 02/22/2009      Years since quitting: 10.4  . Smokeless tobacco: Never Used  Substance Use Topics  . Alcohol use: Yes    Comment: Social  . Drug use: No     Allergies   Patient has no known allergies.   Review of Systems Review of Systems  Cardiovascular: Negative.   Genitourinary: Positive for vaginal discharge.  All other systems reviewed and are negative.    Physical Exam Triage Vital Signs ED Triage Vitals  Enc Vitals Group     BP 07/30/19 1029 (!) 146/84     Pulse Rate 07/30/19 1029 (!) 109     Resp 07/30/19 1029 16     Temp 07/30/19 1029 98.2 F (36.8 C)     Temp Source 07/30/19 1029 Oral     SpO2 07/30/19 1029 100 %     Weight 07/30/19 1030 283 lb (128.4 kg)     Height 07/30/19 1030 5' 4.5" (1.638 m)     Head Circumference --      Peak Flow --      Pain Score 07/30/19 1030 0     Pain Loc --      Pain Edu? --      Excl. in [place]? --    No data found.  Updated Vital Signs BP (!) 146/84   Pulse (!) 109   Temp 98.2 F (36.8 C) (Oral)   Resp 16   Ht 5' 4.5" (1.638 m)   Wt 128.4 kg   SpO2 100%   BMI 47.83 kg/m   Visual Acuity Right Eye Distance:   Left Eye Distance:   Bilateral Distance:    Right Eye Near:   Left Eye Near:    Bilateral Near:     Physical Exam Vitals reviewed.  Constitutional:      Appearance: Normal appearance.  Cardiovascular:     Rate and Rhythm: Normal rate.  Pulmonary:     Effort: Pulmonary effort is normal.  Abdominal:     General: Abdomen is flat.  Genitourinary:    Vagina: Vaginal discharge present.  Musculoskeletal:        General: Normal range of motion.  Skin:    General: Skin is warm.  Neurological:     General: No focal deficit present.     Mental Status: She is alert.  Psychiatric:        Mood and Affect: Mood normal.      UC Treatments /  Results  Labs (all labs ordered are listed, but only abnormal results are displayed) Labs Reviewed - No data to display  EKG   Radiology No results  found.  Procedures Procedures (including critical care time)  Medications Ordered in UC Medications - No data to display  Initial Impression / Assessment and Plan / UC Course  I have reviewed the triage vital signs and the nursing notes.  Pertinent labs & imaging results that were available during my care of the patient were reviewed by me and considered in my medical decision making (see chart for details).     MDM: I suspect yeast infection.  I will send test for BV.   Final Clinical Impressions(s) / UC Diagnoses   Final diagnoses:  Yeast vaginitis     Discharge Instructions     Return if any problems.     ED Prescriptions    Medication Sig Dispense Auth. Provider   fluconazole (DIFLUCAN) 150 MG tablet Take one tablet now and then 1 in 3 days if not improved 2 tablet Fransico Meadow, [place]     PDMP not reviewed this encounter.  An After Visit Summary was printed and given to the patient.    Fransico Meadow, [place] 07/30/19 1209

## 2019-07-30 NOTE — ED Triage Notes (Signed)
Pt c/o vaginal itching, burning w/urination due to scratching vagina so much. Pt denies vaginal discharge and smell.

## 2019-07-30 NOTE — Discharge Instructions (Signed)
Return if any problems.

## 2019-07-31 LAB — CERVICOVAGINAL ANCILLARY ONLY
Bacterial Vaginitis (gardnerella): POSITIVE — AB
Candida Glabrata: NEGATIVE
Candida Vaginitis: POSITIVE — AB
Chlamydia: NEGATIVE
Comment: NEGATIVE
Comment: NEGATIVE
Comment: NEGATIVE
Comment: NEGATIVE
Comment: NEGATIVE
Comment: NORMAL
Neisseria Gonorrhea: NEGATIVE
Trichomonas: NEGATIVE

## 2019-08-01 ENCOUNTER — Telehealth (HOSPITAL_COMMUNITY): Payer: Self-pay | Admitting: Orthopedic Surgery

## 2019-08-01 MED ORDER — METRONIDAZOLE 500 MG PO TABS: 500.0000 mg | ORAL_TABLET | Freq: Two times a day (BID) | ORAL | 0 refills | Status: DC

## 2019-08-10 ENCOUNTER — Ambulatory Visit: Payer: Medicaid Other

## 2019-08-17 ENCOUNTER — Other Ambulatory Visit: Payer: Self-pay

## 2019-08-17 ENCOUNTER — Ambulatory Visit
Admission: RE | Admit: 2019-08-17 | Discharge: 2019-08-17 | Disposition: A | Discharge status: Home / Self Care | Payer: Medicaid Other | Source: Ambulatory Visit | Attending: Oncology | Admitting: Oncology

## 2019-08-17 DIAGNOSIS — Z1589 Genetic susceptibility to other disease: Secondary | ICD-10-CM

## 2019-08-17 DIAGNOSIS — Z1231 Encounter for screening mammogram for malignant neoplasm of breast: Secondary | ICD-10-CM | POA: Diagnosis not present

## 2019-08-23 ENCOUNTER — Other Ambulatory Visit: Payer: Self-pay | Admitting: Internal Medicine

## 2019-08-23 DIAGNOSIS — F3341 Major depressive disorder, recurrent, in partial remission: Secondary | ICD-10-CM

## 2019-09-29 DIAGNOSIS — Z6841 Body Mass Index (BMI) 40.0 and over, adult: Secondary | ICD-10-CM | POA: Diagnosis not present

## 2019-09-29 DIAGNOSIS — Z9884 Bariatric surgery status: Secondary | ICD-10-CM | POA: Diagnosis not present

## 2019-09-29 DIAGNOSIS — K912 Postsurgical malabsorption, not elsewhere classified: Secondary | ICD-10-CM | POA: Diagnosis not present

## 2019-09-29 DIAGNOSIS — Z713 Dietary counseling and surveillance: Secondary | ICD-10-CM | POA: Diagnosis not present

## 2019-10-13 DIAGNOSIS — U071 COVID-19: Secondary | ICD-10-CM | POA: Diagnosis not present

## 2019-10-16 ENCOUNTER — Ambulatory Visit (INDEPENDENT_AMBULATORY_CARE_PROVIDER_SITE_OTHER): Payer: Medicaid Other | Admitting: Student

## 2019-10-16 ENCOUNTER — Telehealth (HOSPITAL_COMMUNITY): Payer: Self-pay | Admitting: Nurse Practitioner

## 2019-10-16 ENCOUNTER — Encounter: Payer: Self-pay | Admitting: Student

## 2019-10-16 DIAGNOSIS — J1282 Pneumonia due to coronavirus disease 2019: Secondary | ICD-10-CM | POA: Diagnosis present

## 2019-10-16 DIAGNOSIS — U071 COVID-19: Secondary | ICD-10-CM | POA: Diagnosis not present

## 2019-10-16 HISTORY — DX: COVID-19: U07.1

## 2019-10-16 HISTORY — DX: Pneumonia due to coronavirus disease 2019: J12.82

## 2019-10-16 MED ORDER — BENZONATATE 100 MG PO CAPS: 100.0000 mg | ORAL_CAPSULE | Freq: Four times a day (QID) | ORAL | 0 refills | Status: DC | PRN

## 2019-10-16 NOTE — Progress Notes (Signed)
  [facility] Internal [facility] Residency Telephone Encounter Continuity Care Appointment  HPI:   This telephone encounter was created for [name] on 10/16/2019 for the following purpose/cc COVID-19 infection.    Past Medical History:  Past Medical History:  Diagnosis Date  . Carpal tunnel syndrome    right  . 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 Family history of prostate cancer   . GERD (gastroesophageal reflux disease)   . Headache(784.0)    migraines or sinus  . History of vertigo    states comes and goes; no current med.  . Obesity   . Osteoarthritis   . PONV (postoperative nausea and vomiting)   . Rash 08/10/2013   right thigh  . Sleep apnea    does not wear cpap      ROS:  Review of Systems  Constitutional: Positive for chills and fever.  Respiratory: Positive for cough and shortness of breath.   Cardiovascular: Negative for chest pain.  Gastrointestinal: Positive for nausea. Negative for abdominal pain, diarrhea and vomiting.  Musculoskeletal: Positive for myalgias.  Neurological: Negative for dizziness, loss of consciousness and headaches.       Assessment / Plan / Recommendations:   Please see A&P under problem oriented charting for assessment of the patient's acute and chronic medical conditions.   As always, pt is advised that if symptoms worsen or new symptoms arise, they should go to an urgent care facility or to to ER for further evaluation.   Consent and Medical Decision Making:   Patient seen with Dr. Rebeca Alert  This is a telephone encounter between [facility] and Andrew Au on 10/16/2019 for COVID-19 infection. The visit was conducted with the patient located at home and Andrew Au at [facility]. The patient's identity was confirmed using their DOB and current address. The patient has consented to being evaluated through a telephone encounter and understands the  associated risks (an examination cannot be done and the patient may need to come in for an appointment) / benefits (allows the patient to remain at home, decreasing exposure to coronavirus). I personally spent 15 minutes on medical discussion.

## 2019-10-16 NOTE — Patient Instructions (Signed)
Thank you for allowing [place] to be a part of your care today, it was pleasure seeing you. We discussed your COVID-19 infection  I have made these changes to your medications: Start Tessalon Perles prn for coughing  I have referred you to the monoclonal antibody infusion team for further coordination.  Please call us or go to an ED if you have worsening SOB, dehydration, or confusion.   Thank you, and please call the Internal Medicine Clinic at [phone] if you have any questions.  Best, Dr. Imogene Burn

## 2019-10-16 NOTE — Telephone Encounter (Signed)
Called to discuss with Maxie Barb Torti about Covid symptoms and the use of casirivimab/imdevimab, a combination monoclonal antibody infusion for those with mild to moderate Covid symptoms and at a high risk of hospitalization.     Pt is qualified for this infusion at the [facility] infusion center due to co-morbid conditions (as indicated below)   Unable to reach. Unable to leave Voicemail (full). Mychart message sent.    Patient Active Problem List   Diagnosis Date Noted  . COVID-19 virus infection 10/16/2019  . Nexplanon in place 05/29/2019  . At high risk for breast cancer 10/05/2018  . 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 Genetic testing 09/10/2017  . Family history of breast cancer   . Family history of prostate cancer   . Abnormal chest x-ray 03/24/2017  . Pharyngitis 07/14/2016  . Heel pain 06/25/2016  . Urinary frequency 02/28/2016  . S/P bariatric surgery 09/04/2015  . Depression 08/05/2015  . Muscle spasm of back 08/05/2015  . Carpal tunnel syndrome 08/05/2015  . Prediabetes 06/08/2014  . Morbid obesity ([place]) 06/12/2011    Alda Lea, AGPCNP-BC

## 2019-10-16 NOTE — Assessment & Plan Note (Signed)
Patient seen via telehealth visit for COVID-19 infection. Comorbid obesity and pre-DM. Reports symptoms for the past week, and positive test 3 days ago. Complains of persistent fever with Tmax of 100.7, chills, mild SOB, body aches which are improving, cough, nausea, and diarrhea. She SOB is not severe enough to limit ADLs. She has had nausea but no vomiting. Has had diarrhea roughly every other day. She feels that she is remaining hydrated. Has been Tylenol 500mg  q4h for her fevers.  -patient is open to monoclonal Ab infusion. Referred to [facility] for further coordination -tessalon prescribed for cough -instructed to call or visit ED for worsening SOB, dehydration, or confusion

## 2019-10-17 ENCOUNTER — Other Ambulatory Visit: Payer: Self-pay | Admitting: Oncology

## 2019-10-17 ENCOUNTER — Encounter: Payer: Self-pay | Admitting: Oncology

## 2019-10-17 ENCOUNTER — Ambulatory Visit (HOSPITAL_COMMUNITY)
Admission: RE | Admit: 2019-10-17 | Discharge: 2019-10-17 | Disposition: A | Discharge status: Home / Self Care | Payer: Medicaid Other | Source: Ambulatory Visit | Attending: Pulmonary Disease | Admitting: Pulmonary Disease

## 2019-10-17 DIAGNOSIS — U071 COVID-19: Secondary | ICD-10-CM | POA: Diagnosis not present

## 2019-10-17 MED ORDER — SODIUM CHLORIDE 0.9 % IV SOLN: INTRAVENOUS | Status: DC | PRN

## 2019-10-17 MED ORDER — EPINEPHRINE 0.3 MG/0.3ML IJ SOAJ: 0.3000 mg | Freq: Once | INTRAMUSCULAR | Status: DC | PRN

## 2019-10-17 MED ORDER — ALBUTEROL SULFATE HFA 108 (90 BASE) MCG/ACT IN AERS: 2.0000 | INHALATION_SPRAY | Freq: Once | RESPIRATORY_TRACT | Status: DC | PRN

## 2019-10-17 MED ORDER — DIPHENHYDRAMINE HCL 50 MG/ML IJ SOLN: 50.0000 mg | Freq: Once | INTRAMUSCULAR | Status: DC | PRN

## 2019-10-17 MED ORDER — ACETAMINOPHEN 325 MG PO TABS
650.0000 mg | ORAL_TABLET | Freq: Once | ORAL | Status: AC
  Administered 2019-10-17: 650 mg via ORAL
  Filled 2019-10-17: qty 2

## 2019-10-17 MED ORDER — METHYLPREDNISOLONE SODIUM SUCC 125 MG IJ SOLR: 125.0000 mg | Freq: Once | INTRAMUSCULAR | Status: DC | PRN

## 2019-10-17 MED ORDER — SODIUM CHLORIDE 0.9 % IV SOLN
1200.0000 mg | Freq: Once | INTRAVENOUS | Status: AC
  Administered 2019-10-17: 1200 mg via INTRAVENOUS
  Filled 2019-10-17: qty 1200

## 2019-10-17 MED ORDER — FAMOTIDINE IN NACL 20-0.9 MG/50ML-% IV SOLN: 20.0000 mg | Freq: Once | INTRAVENOUS | Status: DC | PRN

## 2019-10-17 NOTE — Discharge Instructions (Signed)

## 2019-10-17 NOTE — Progress Notes (Signed)
Internal Medicine Clinic Attending  Case discussed with Dr. Imogene Burn at the time of the visit.  We reviewed the resident's history and exam and pertinent patient test results.  I spoke with the patient and confirmed the history and plan. I agree with the assessment, diagnosis, and plan of care documented in the resident's note.  Jessy Oto, M.D., Ph.D.

## 2019-10-17 NOTE — Progress Notes (Signed)
I connected by phone with  Mrs. Russell at 4:00pm on 10/17/19 to discuss the potential use of an new treatment for mild to moderate COVID-19 viral infection in non-hospitalized patients.   This patient is a age/sex that meets the FDA criteria for Emergency Use Authorization of casirivimab\imdevimab.  Has a (+) direct SARS-CoV-2 viral test result 1. Has mild or moderate COVID-19  2. Is ? 44 years of age and weighs ? 40 kg 3. Is NOT hospitalized due to COVID-19 4. Is NOT requiring oxygen therapy or requiring an increase in baseline oxygen flow rate due to COVID-19 5. Is within 10 days of symptom onset 6. Has at least one of the high risk factor(s) for progression to severe COVID-19 and/or hospitalization as defined in EUA. ? Specific high risk criteria : obesity, DM    Symptom onset 10/09/19   I have spoken and communicated the following to the patient or parent/caregiver:   1. FDA has authorized the emergency use of casirivimab\imdevimab for the treatment of mild to moderate COVID-19 in adults and pediatric patients with positive results of direct SARS-CoV-2 viral testing who are 56 years of age and older weighing at least 40 kg, and who are at high risk for progressing to severe COVID-19 and/or hospitalization.   2. The significant known and potential risks and benefits of casirivimab\imdevimab, and the extent to which such potential risks and benefits are unknown.   3. Information on available alternative treatments and the risks and benefits of those alternatives, including clinical trials.   4. Patients treated with casirivimab\imdevimab should continue to self-isolate and use infection control measures (e.g., wear mask, isolate, social distance, avoid sharing personal items, clean and disinfect high touch surfaces, and frequent handwashing) according to CDC guidelines.    5. The patient or parent/caregiver has the option to accept or refuse casirivimab\imdevimab .   After reviewing this  information with the patient, The patient agreed to proceed with receiving casirivimab\imdevimab infusion and will be provided a copy of the Fact sheet prior to receiving the infusion.Mignon Pine, AGNP-C [phone] (Infusion Center Hotline)

## 2019-10-17 NOTE — Progress Notes (Signed)
  Diagnosis: COVID-19  Physician:Dr Wright Procedure: [facility]fusion [facility] Med: casirivimab\imdevimab infusion - Provided patient with casirivimab\imdevimab fact sheet for patients, parents and caregivers prior to infusion.  Complications: No immediate complications noted.  Discharge: Discharged home   Remona Boom, Caylin Apple 10/17/2019  

## 2019-10-18 ENCOUNTER — Telehealth: Payer: Self-pay | Admitting: Student

## 2019-10-18 MED ORDER — NAPROXEN 250 MG PO TABS: 250.0000 mg | ORAL_TABLET | Freq: Four times a day (QID) | ORAL | 0 refills | Status: AC | PRN

## 2019-10-18 MED ORDER — DEXTROMETHORPHAN HBR 15 MG/5ML PO SYRP: 10.0000 mL | ORAL_SOLUTION | Freq: Four times a day (QID) | ORAL | 0 refills | Status: DC | PRN

## 2019-10-18 NOTE — Progress Notes (Signed)
Spoke with patient over the phone. She received mab infusion last night. She is complaining of increasing SOB. Notes that she desated to 82% while at the infusion clinic last night, but it improved into upper 90s with deep breathing, without requiring supplemental oxygen.  Today, she complains of continued SOB and chest pain when coughing. EMS was called, and I spoke with the paramedic over the phone who reports the patient was satting at 98% on room air and has not been hypoxic during their visit. She states that her other vital signs are stable as well. Patient also confirms that when she breathes while seated, she is able to catch her breath. Told patient that she may go to the ED if she feels necessary, but we would also be comfortable treating her on an outpatient basis for now. She agrees with this and has a telehealth follow up appointment scheduled for tomorrow.   I prescribed the patient a dextromethorphan cough syrup for continued coughing, and naproxen for pain associated with coughing. Instructed her to call back or seek care at an ED for increasing symptoms.

## 2019-10-19 ENCOUNTER — Ambulatory Visit: Payer: Medicaid Other | Admitting: Internal Medicine

## 2019-10-19 ENCOUNTER — Emergency Department (HOSPITAL_COMMUNITY): Payer: Medicaid Other

## 2019-10-19 ENCOUNTER — Encounter: Payer: Self-pay | Admitting: Internal Medicine

## 2019-10-19 ENCOUNTER — Other Ambulatory Visit: Payer: Self-pay

## 2019-10-19 ENCOUNTER — Encounter (HOSPITAL_COMMUNITY): Payer: Self-pay

## 2019-10-19 ENCOUNTER — Inpatient Hospital Stay (HOSPITAL_COMMUNITY)
Admission: EM | Admit: 2019-10-19 | Discharge: 2019-10-24 | DRG: 177 | Disposition: A | Discharge status: Home Health | Payer: Medicaid Other | Attending: Internal Medicine | Admitting: Internal Medicine

## 2019-10-19 DIAGNOSIS — Z791 Long term (current) use of non-steroidal anti-inflammatories (NSAID): Secondary | ICD-10-CM

## 2019-10-19 DIAGNOSIS — U071 COVID-19: Principal | ICD-10-CM | POA: Diagnosis present

## 2019-10-19 DIAGNOSIS — R7982 Elevated C-reactive protein (CRP): Secondary | ICD-10-CM | POA: Diagnosis present

## 2019-10-19 DIAGNOSIS — E669 Obesity, unspecified: Secondary | ICD-10-CM | POA: Diagnosis not present

## 2019-10-19 DIAGNOSIS — J9601 Acute respiratory failure with hypoxia: Secondary | ICD-10-CM | POA: Diagnosis not present

## 2019-10-19 DIAGNOSIS — Z79899 Other long term (current) drug therapy: Secondary | ICD-10-CM | POA: Diagnosis not present

## 2019-10-19 DIAGNOSIS — R197 Diarrhea, unspecified: Secondary | ICD-10-CM | POA: Diagnosis present

## 2019-10-19 DIAGNOSIS — Z8249 Family history of ischemic heart disease and other diseases of the circulatory system: Secondary | ICD-10-CM | POA: Diagnosis not present

## 2019-10-19 DIAGNOSIS — Z87891 Personal history of nicotine dependence: Secondary | ICD-10-CM

## 2019-10-19 DIAGNOSIS — Z8042 Family history of malignant neoplasm of prostate: Secondary | ICD-10-CM | POA: Diagnosis not present

## 2019-10-19 DIAGNOSIS — J189 Pneumonia, unspecified organism: Secondary | ICD-10-CM | POA: Diagnosis not present

## 2019-10-19 DIAGNOSIS — J1282 Pneumonia due to coronavirus disease 2019: Secondary | ICD-10-CM | POA: Diagnosis not present

## 2019-10-19 DIAGNOSIS — M1990 Unspecified osteoarthritis, unspecified site: Secondary | ICD-10-CM | POA: Diagnosis present

## 2019-10-19 DIAGNOSIS — G4730 Sleep apnea, unspecified: Secondary | ICD-10-CM | POA: Diagnosis not present

## 2019-10-19 DIAGNOSIS — F329 Major depressive disorder, single episode, unspecified: Secondary | ICD-10-CM | POA: Diagnosis not present

## 2019-10-19 DIAGNOSIS — Z803 Family history of malignant neoplasm of breast: Secondary | ICD-10-CM

## 2019-10-19 DIAGNOSIS — R0602 Shortness of breath: Secondary | ICD-10-CM | POA: Diagnosis not present

## 2019-10-19 DIAGNOSIS — K5900 Constipation, unspecified: Secondary | ICD-10-CM | POA: Diagnosis present

## 2019-10-19 DIAGNOSIS — F419 Anxiety disorder, unspecified: Secondary | ICD-10-CM | POA: Diagnosis present

## 2019-10-19 DIAGNOSIS — R42 Dizziness and giddiness: Secondary | ICD-10-CM | POA: Diagnosis not present

## 2019-10-19 DIAGNOSIS — Z5320 Procedure and treatment not carried out because of patient's decision for unspecified reasons: Secondary | ICD-10-CM | POA: Diagnosis present

## 2019-10-19 DIAGNOSIS — K219 Gastro-esophageal reflux disease without esophagitis: Secondary | ICD-10-CM | POA: Diagnosis present

## 2019-10-19 DIAGNOSIS — Z833 Family history of diabetes mellitus: Secondary | ICD-10-CM

## 2019-10-19 DIAGNOSIS — Z9049 Acquired absence of other specified parts of digestive tract: Secondary | ICD-10-CM

## 2019-10-19 HISTORY — DX: Acute respiratory failure with hypoxia: J96.01

## 2019-10-19 LAB — CBC WITH DIFFERENTIAL/PLATELET
Abs Immature Granulocytes: 0 10*3/uL (ref 0.00–0.07)
Basophils Absolute: 0 10*3/uL (ref 0.0–0.1)
Basophils Relative: 0 %
Eosinophils Absolute: 0 10*3/uL (ref 0.0–0.5)
Eosinophils Relative: 0 %
HCT: 46.4 % — ABNORMAL HIGH (ref 36.0–46.0)
Hemoglobin: 14.9 g/dL (ref 12.0–15.0)
Lymphocytes Relative: 15 %
Lymphs Abs: 0.9 10*3/uL (ref 0.7–4.0)
MCH: 29.4 pg (ref 26.0–34.0)
MCHC: 32.1 g/dL (ref 30.0–36.0)
MCV: 91.5 fL (ref 80.0–100.0)
Monocytes Absolute: 0.2 10*3/uL (ref 0.1–1.0)
Monocytes Relative: 3 %
Neutro Abs: 5.1 10*3/uL (ref 1.7–7.7)
Neutrophils Relative %: 82 %
Platelets: 241 10*3/uL (ref 150–400)
RBC: 5.07 MIL/uL (ref 3.87–5.11)
RDW: 13.8 % (ref 11.5–15.5)
WBC: 6.2 10*3/uL (ref 4.0–10.5)
nRBC: 0 % (ref 0.0–0.2)
nRBC: 0 /100 WBC

## 2019-10-19 LAB — COMPREHENSIVE METABOLIC PANEL
ALT: 39 U/L (ref 0–44)
AST: 59 U/L — ABNORMAL HIGH (ref 15–41)
Albumin: 2.8 g/dL — ABNORMAL LOW (ref 3.5–5.0)
Alkaline Phosphatase: 51 U/L (ref 38–126)
Anion gap: 12 (ref 5–15)
BUN: 9 mg/dL (ref 6–20)
CO2: 24 mmol/L (ref 22–32)
Calcium: 8.4 mg/dL — ABNORMAL LOW (ref 8.9–10.3)
Chloride: 100 mmol/L (ref 98–111)
Creatinine, Ser: 0.88 mg/dL (ref 0.44–1.00)
GFR calc Af Amer: >60 mL/min (ref >60)
GFR calc non Af Amer: >60 mL/min (ref >60)
Glucose, Bld: 115 mg/dL — ABNORMAL HIGH (ref 70–99)
Potassium: 4.2 mmol/L (ref 3.5–5.1)
Sodium: 136 mmol/L (ref 135–145)
Total Bilirubin: 0.5 mg/dL (ref 0.3–1.2)
Total Protein: 6.8 g/dL (ref 6.5–8.1)

## 2019-10-19 LAB — LACTATE DEHYDROGENASE: LDH: 381 U/L — ABNORMAL HIGH (ref 98–192)

## 2019-10-19 LAB — HIV ANTIBODY (ROUTINE TESTING W REFLEX): HIV Screen 4th Generation wRfx: NONREACTIVE

## 2019-10-19 LAB — I-STAT BETA HCG BLOOD, ED (MC, WL, AP ONLY): I-stat hCG, quantitative: <5 m[IU]/mL (ref <5)

## 2019-10-19 LAB — FIBRINOGEN: Fibrinogen: 664 mg/dL — ABNORMAL HIGH (ref 210–475)

## 2019-10-19 LAB — PROCALCITONIN: Procalcitonin: <0.1 ng/mL

## 2019-10-19 LAB — TRIGLYCERIDES: Triglycerides: 185 mg/dL — ABNORMAL HIGH (ref <150)

## 2019-10-19 LAB — SARS CORONAVIRUS 2 BY RT PCR (HOSPITAL ORDER, PERFORMED IN ~~LOC~~ HOSPITAL LAB): SARS Coronavirus 2: POSITIVE — AB

## 2019-10-19 LAB — FERRITIN: Ferritin: 1190 ng/mL — ABNORMAL HIGH (ref 11–307)

## 2019-10-19 LAB — D-DIMER, QUANTITATIVE: D-Dimer, Quant: 1.98 ug/mL-FEU — ABNORMAL HIGH (ref 0.00–0.50)

## 2019-10-19 LAB — ABO/RH: ABO/RH(D): A POS

## 2019-10-19 LAB — LACTIC ACID, PLASMA: Lactic Acid, Venous: 1.3 mmol/L (ref 0.5–1.9)

## 2019-10-19 LAB — C-REACTIVE PROTEIN: CRP: 11.2 mg/dL — ABNORMAL HIGH (ref <1.0)

## 2019-10-19 LAB — TROPONIN I (HIGH SENSITIVITY)
Troponin I (High Sensitivity): 5 ng/L (ref <18)
Troponin I (High Sensitivity): 6 ng/L (ref <18)

## 2019-10-19 MED ORDER — SODIUM CHLORIDE 0.9 % IV SOLN: 200.0000 mg | Freq: Once | INTRAVENOUS | Status: DC

## 2019-10-19 MED ORDER — METHYLPREDNISOLONE SODIUM SUCC 125 MG IJ SOLR
125.0000 mg | Freq: Once | INTRAMUSCULAR | Status: DC
  Filled 2019-10-19: qty 2

## 2019-10-19 MED ORDER — ENOXAPARIN SODIUM 60 MG/0.6ML ~~LOC~~ SOLN
60.0000 mg | SUBCUTANEOUS | Status: DC
  Administered 2019-10-20 – 2019-10-24 (×5): 60 mg via SUBCUTANEOUS
  Filled 2019-10-19 (×5): qty 0.6

## 2019-10-19 MED ORDER — SODIUM CHLORIDE 0.9 % IV SOLN
200.0000 mg | Freq: Once | INTRAVENOUS | Status: AC
  Administered 2019-10-19: 200 mg via INTRAVENOUS
  Filled 2019-10-19: qty 200

## 2019-10-19 MED ORDER — SODIUM CHLORIDE 0.9 % IV SOLN: 100.0000 mg | Freq: Every day | INTRAVENOUS | Status: DC

## 2019-10-19 MED ORDER — DEXAMETHASONE SODIUM PHOSPHATE 10 MG/ML IJ SOLN
6.0000 mg | Freq: Once | INTRAMUSCULAR | Status: AC
  Administered 2019-10-19: 6 mg via INTRAVENOUS
  Filled 2019-10-19: qty 1

## 2019-10-19 MED ORDER — SERTRALINE HCL 50 MG PO TABS
50.0000 mg | ORAL_TABLET | Freq: Every day | ORAL | Status: DC
  Filled 2019-10-19 (×5): qty 1

## 2019-10-19 MED ORDER — BARICITINIB 2 MG PO TABS
4.0000 mg | ORAL_TABLET | Freq: Every day | ORAL | Status: DC
  Administered 2019-10-19 – 2019-10-24 (×6): 4 mg via ORAL
  Filled 2019-10-19 (×7): qty 2

## 2019-10-19 MED ORDER — DEXAMETHASONE SODIUM PHOSPHATE 10 MG/ML IJ SOLN: 6.0000 mg | Freq: Once | INTRAMUSCULAR | Status: DC

## 2019-10-19 MED ORDER — HYDROCOD POLST-CPM POLST ER 10-8 MG/5ML PO SUER: 5.0000 mL | Freq: Two times a day (BID) | ORAL | Status: DC | PRN

## 2019-10-19 MED ORDER — SODIUM CHLORIDE 0.9 % IV SOLN
100.0000 mg | Freq: Every day | INTRAVENOUS | Status: DC
  Administered 2019-10-20 – 2019-10-21 (×2): 100 mg via INTRAVENOUS
  Filled 2019-10-19: qty 20
  Filled 2019-10-19: qty 100
  Filled 2019-10-19: qty 20

## 2019-10-19 MED ORDER — GUAIFENESIN-DM 100-10 MG/5ML PO SYRP
10.0000 mL | ORAL_SOLUTION | ORAL | Status: DC | PRN
  Administered 2019-10-19 – 2019-10-24 (×6): 10 mL via ORAL
  Filled 2019-10-19 (×6): qty 10

## 2019-10-19 MED ORDER — DEXAMETHASONE 6 MG PO TABS
6.0000 mg | ORAL_TABLET | ORAL | Status: DC
  Administered 2019-10-20 – 2019-10-24 (×5): 6 mg via ORAL
  Filled 2019-10-19 (×5): qty 1

## 2019-10-19 NOTE — ED Notes (Signed)
Dinner ordered 

## 2019-10-19 NOTE — ED Notes (Signed)
Paged Christian to [employer]

## 2019-10-19 NOTE — Assessment & Plan Note (Addendum)
Patient presented today with worsening of shortness of breath and pleuritic chest pain in past few days.  She was diagnosed with COVID-19, about 6 days ago. (no test result in chart). She received Monoclonal antibody infusion and some supportive treatment for cough at home.   She had worsening of SOB and cough and came to internal medicine clinic for further evaluation. She reports subjective fever this morning.  On exam: She is afebrile, she is uncomfortable due to cough. She is suturating at 81% on room air. She required 4 lit of O2 via [place] to keep O2 sat at 88%. She has bibasilar fine crackles. No leg swelling or leg pain  -Given acute hypoxia and as she is requiring more than 4 li supplemental O2 to keep O2 dat>88%, will send patient to ED for further management. I talked to charge nurse in ED for ED admission and she will kindly hold a room for her and we transfer pt to ED now. -No tachycardia but may consider CTA to r/u PE

## 2019-10-19 NOTE — ED Notes (Signed)
Hospital bed ordered for pt.

## 2019-10-19 NOTE — Progress Notes (Addendum)
   CC: Shortness of breath  HPI:  [name] is a 44 y.o. female with COVID-19 infection, (diagnosed 10/13/2019), and pre DM, presented with worsening of shortness of breath. Please refer to problem based charting for further details and assessment and plan of current problem and chronic medical conditions.  Past Medical History:  Diagnosis Date  . Carpal tunnel syndrome    right  . 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 Family history of prostate cancer   . GERD (gastroesophageal reflux disease)   . Headache(784.0)    migraines or sinus  . History of vertigo    states comes and goes; no current med.  . Obesity   . Osteoarthritis   . PONV (postoperative nausea and vomiting)   . Rash 08/10/2013   right thigh  . Sleep apnea    does not wear cpap   Review of Systems:   Review of Systems  Constitutional: Fever:         Positive for subjective fever  Respiratory: Positive for cough and shortness of breath.   Cardiovascular: Positive for chest pain.       Pleuritic chest pain   Physical Exam:  Vitals:   10/19/19 1144  BP: 101/66  Pulse: 99  Temp: 98.2 F (36.8 C)  TempSrc: Oral  SpO2: (!) 81%   Constitutional: Well-developed and well-nourished. No acute distress.  HENT:  Head: Normocephalic and atraumatic.  Cardiovascular:  RRR, nl S1S2, no murmur,  no LEE Respiratory: Hypoxic, no respiratory distress, bibasilar fine crackles Neurological: Is alert and oriented x 3  MSK: No swelling, erythema or tenderness of lower extremities Skin: Not diaphoretic. No erythema.  Psychiatric:  Normal mood and affect. Behavior is normal. Judgment and thought content normal.  Assessment & Plan:   See Encounters Tab for problem based charting.  Patient discussed with Dr. Evette Doffing

## 2019-10-19 NOTE — ED Notes (Signed)
Phlebotomy unable to obtain labs 

## 2019-10-19 NOTE — ED Notes (Signed)
This RN attempted IV access without success, IV team to be consulted

## 2019-10-19 NOTE — Addendum Note (Signed)
Addended byChevis Pretty on: 10/19/2019 04:54 PM   Modules accepted: Level of Service

## 2019-10-19 NOTE — H&P (Signed)
Date: 10/19/2019               Patient Name:  Dawn Galvan MRN: [id number]  DOB: 05/05/1975 Age / Sex: 44 y.o., female   PCP: Delice Bison, DO         Medical Service: Internal Medicine Teaching Service         Attending Physician: Dr. Sid Falcon, MD    First Contact: Dr. Shon Baton Pager: [phone]  Second Contact: Dr. Koleen Distance Pager: [phone]       After Hours (After 5p/  First Contact Pager: [phone]  weekends / holidays): Second Contact Pager: [phone]   Chief Complaint: shortness of breath   History of Present Illness: Dawn Galvan is a 44 y/o unvaccinated female who was sent from [facility] with progressive shortness of breath and cough. She tested positive for COVID-19 on 10/13/19 and got the monoclonal antibody infusion on 10/17/19. She noticed worsening shortness of breath and cough beginning yesterday. EMS came out for evaluation, and she was maintaining O2 sats at 98%. On evaluation in clinic today, she was 81% on room air. She was subsequently sent to the ED for admission.   Dawn Galvan notes her symptoms began approximately 10 days ago with minor nasal congestion, sore throat and cough. She then began having worsening muscle aches and cough which is what prompted her to get tested. She also endorses diarrhea and decreased appetite.   Meds:  Current Outpatient Medications  Medication Instructions  . benzonatate (TESSALON PERLES) 100 mg, Oral, Every 6 hours PRN  . dextromethorphan 30 mg, Oral, 4 times daily PRN  . fluconazole (DIFLUCAN) 150 MG tablet Take one tablet now and then 1 in 3 days if not improved  . metroNIDAZOLE (FLAGYL) 500 mg, Oral, 2 times daily  . naproxen (NAPROSYN) 250 mg, Oral, 4 times daily PRN  . phentermine 37.5 mg, Oral, BH-each morning  . sertraline (ZOLOFT) 50 MG tablet TAKE 1 TABLET BY MOUTH EVERY DAY    Allergies: Allergies as of 10/19/2019  . (No Known Allergies)   Past Medical History:  Diagnosis Date  . Carpal tunnel syndrome     right  . 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 Family history of prostate cancer   . GERD (gastroesophageal reflux disease)   . Headache(784.0)    migraines or sinus  . History of vertigo    states comes and goes; no current med.  . Obesity   . Osteoarthritis   . PONV (postoperative nausea and vomiting)   . Rash 08/10/2013   right thigh  . Sleep apnea    does not wear cpap    Family History:  Family History  Problem Relation Age of Onset  . Hypertension Mother   . Breast cancer Mother   . Hypertension Father   . Diabetes Maternal Grandmother   . Diabetes Paternal Grandmother    Social History: lives at home, former tobacco use, occasional EtOH, no illicit drug use   Review of Systems: A complete ROS was negative except as per HPI.   Physical Exam: Blood pressure 118/88, pulse 81, temperature 98.8 F (37.1 C), temperature source Oral, resp. rate (!) 32, height '5\' 4"'  (1.626 m), weight 124.7 kg, SpO2 94 %. General: awake, alert, mildly ill appearing, NAD HEENT: [place]/AT; conjunctiva normal CV: RRR; no m/r/g Pulm: tachypneic; taking shallow breaths, but no obvious crackles or wheezes  Abd: abdomen soft, non-tender, non-distended, Neuro: A&Ox4; no focal deficits  Skin: warm and dry; non-diaphoretic Ext: No edema   EKG: personally reviewed my interpretation is NSR with PACs  CXR: personally reviewed my interpretation is multifocal opacities   Assessment & Plan by Problem: Active Problems:   Pneumonia due to COVID-19 virus  Dawn Galvan is a 44 y/o unvaccinated female who presents with several day history of progressive cough and shortness of breath in the setting of testing positive for COVID-19 on 10/13/19  COVID-19 pneumonia -presented with O2 sats in the low 80s on room air -had progression of symptoms despite receiving monoclonal antibody as an outpatient on 8/24 -currently on 8L HFNC -will start  remdesivir, decadron and baricitinib (based on elevated CRP and rapid progression) -tussionex, robitussin for cough -flutter valve, IS -self-prone if able   Dispo: Admit patient to Inpatient with expected length of stay greater than 2 midnights.  SignedDelice Bison, DO 10/19/2019, 5:10 PM  Pager: [phone] After 5pm on weekdays and 1pm on weekends: On Call pager: [phone]

## 2019-10-19 NOTE — Progress Notes (Signed)
Internal Medicine Clinic Attending  I saw and evaluated the patient.  I personally confirmed the key portions of the history and exam documented by Dr. Maryla Morrow and I reviewed pertinent patient test results.  The assessment, diagnosis, and plan were formulated together and I agree with the documentation in the resident's note.   Worsening acute hypoxic respiratory failure due to Covid19 pneumonia. On EMS check at home yesterday, she was saturating 98% on room air, today she is 80% on RA, requiring at least 6 L via [place] if not more. This quick progression implies she is entering an inflammatory phase, at risk for progressive hypoxia and infiltrates. We will send her for admission to receive dexamethasone, remdesivir, and baricitinib.

## 2019-10-19 NOTE — ED Notes (Signed)
Iv team at bedside  

## 2019-10-19 NOTE — ED Triage Notes (Signed)
Pt sent by IM clinic for worsening SOB, pt COVID+, 81% on room air. Pt placed on NRB at 15L and increased to 93%. Pt alert

## 2019-10-19 NOTE — ED Notes (Signed)
Iv infiltrated, IV removed and IV team to be consulted.

## 2019-10-19 NOTE — ED Provider Notes (Signed)
[facility] EMERGENCY DEPARTMENT Provider Note   CSN: [id number] Arrival date & time: 10/19/19  1212     History Chief Complaint  Patient presents with  . COVID+  . Shortness of Breath    Dawn Galvan is a 44 y.o. female with a history of sleep apnea, obesity, & depression who presents to the ED for worsening dyspnea with acute hypoxia at internal medicine clinic just PTA. Patient has felt poorly since 10/09/19 and was diagnosed with COVID 19 about 1 week ago, on day 11 of symptoms. She has had fever, chills, nasal congestion, cough productive of yellow phelgm sputum, dyspnea, intermittent chest pain, vomiting, & diarrhea. She feels her breathing is getting progressively more labored. Seen at internal medicine residency clinic and found to be hypoxic therefore she was sent to the ED. She denies syncope, hemoptysis, leg pain/swelling, hemoptysis, recent surgery/trauma, recent long travel, hormone use, personal hx of cancer, or hx of DVT/PE. She is not vaccinated against COVID 19.   HPI     Past Medical History:  Diagnosis Date  . Carpal tunnel syndrome    right  . 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 Family history of prostate cancer   . GERD (gastroesophageal reflux disease)   . Headache(784.0)    migraines or sinus  . History of vertigo    states comes and goes; no current med.  . Obesity   . Osteoarthritis   . PONV (postoperative nausea and vomiting)   . Rash 08/10/2013   right thigh  . Sleep apnea    does not wear cpap    Patient Active Problem List   Diagnosis Date Noted  . Acute respiratory failure with hypoxia ([place]) 10/19/2019  . COVID-19 virus infection 10/16/2019  . Nexplanon in place 05/29/2019  . At high risk for breast cancer 10/05/2018  . 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 Genetic testing 09/10/2017  .  Family history of breast cancer   . Family history of prostate cancer   . Abnormal chest x-ray 03/24/2017  . Pharyngitis 07/14/2016  . Heel pain 06/25/2016  . Urinary frequency 02/28/2016  . S/P bariatric surgery 09/04/2015  . Depression 08/05/2015  . Muscle spasm of back 08/05/2015  . Carpal tunnel syndrome 08/05/2015  . Prediabetes 06/08/2014  . Morbid obesity ([place]) 06/12/2011    Past Surgical History:  Procedure Laterality Date  . CARPAL TUNNEL RELEASE Left 08/18/2013   Procedure: LEFT CARPAL TUNNEL RELEASE;  Surgeon: Tennis Must, MD;  Location: [facility];  Service: Orthopedics;  Laterality: Left;  . CARPAL TUNNEL RELEASE Right 04/29/2017   Procedure: RIGHT CARPAL TUNNEL RELEASE;  Surgeon: Leanora Cover, MD;  Location: [facility];  Service: Orthopedics;  Laterality: Right;  . CESAREAN SECTION    . CHOLECYSTECTOMY    . HERNIA REPAIR     x 2  . HYSTEROPLASTY REPAIR OF UTERINE ANOMALY    . HYSTEROSCOPY WITH D & C  01/13/2012   Procedure: DILATATION AND CURETTAGE /HYSTEROSCOPY;  Surgeon: Osborne Oman, MD;  Location: [facility];  Service: Gynecology;  Laterality: N/A;  Pap smear  . [place] RESECTION  2016  . TOE SURGERY Left    second toe. rod placed  . WISDOM TOOTH EXTRACTION  11/20/10     OB History    Gravida  2   Para  2   Term  2   Preterm  0   AB  0   Living  2     SAB  0   TAB  0   Ectopic  0   Multiple  0   Live Births              Family History  Problem Relation Age of Onset  . Hypertension Mother   . Breast cancer Mother   . Hypertension Father   . Diabetes Maternal Grandmother   . Diabetes Paternal Grandmother     Social History   Tobacco Use  . Smoking status: Former Smoker    Years: 0.00    Quit date: 02/22/2009    Years since quitting: 10.6  . Smokeless tobacco: Never Used  Vaping Use  . Vaping Use: Never used  Substance Use Topics  . Alcohol use: Yes    Comment: Social  . Drug use: No    Home  Medications Prior to Admission medications   Medication Sig Start Date End Date Taking? Authorizing Provider  benzonatate (TESSALON PERLES) 100 MG capsule Take 1 capsule (100 mg total) by mouth every 6 (six) hours as needed for cough. 10/16/19 10/15/20  Andrew Au, MD  dextromethorphan 15 MG/5ML syrup Take 10 mLs (30 mg total) by mouth 4 (four) times daily as needed for cough. 10/18/19   Andrew Au, MD  fluconazole (DIFLUCAN) 150 MG tablet Take one tablet now and then 1 in 3 days if not improved 07/30/19   Caryl Ada K, PA-C  metroNIDAZOLE (FLAGYL) 500 MG tablet Take 1 tablet (500 mg total) by mouth 2 (two) times daily. 08/01/19   Lamptey, Myrene Galas, MD  naproxen (NAPROSYN) 250 MG tablet Take 1 tablet (250 mg total) by mouth 4 (four) times daily as needed for up to 10 days. 10/18/19 10/28/19  Andrew Au, MD  phentermine 37.5 MG capsule Take 37.5 mg by mouth every morning.    [provider]  sertraline (ZOLOFT) 50 MG tablet TAKE 1 TABLET BY MOUTH EVERY DAY 08/23/19   [place], Nila Nephew D, DO    Allergies    Patient has no known allergies.  Review of Systems   Review of Systems  Constitutional: Positive for chills, fatigue and fever.  Respiratory: Positive for cough and shortness of breath.   Cardiovascular: Positive for chest pain. Negative for leg swelling.  Gastrointestinal: Positive for diarrhea, nausea and vomiting. Negative for abdominal pain, blood in stool and constipation.  Genitourinary: Negative for dysuria.  Neurological: Positive for weakness (generalized). Negative for syncope.  All other systems reviewed and are negative.   Physical Exam Updated Vital Signs BP (!) 133/97 (BP Location: Right Arm)   Pulse 86   Temp 98.8 F (37.1 C) (Oral)   Resp (!) 102   SpO2 (!) 86%   Physical Exam Vitals and nursing note reviewed.  Constitutional:      Appearance: She is ill-appearing.  HENT:     Head: Normocephalic and atraumatic.     Mouth/Throat:     Pharynx:  Oropharynx is clear.  Eyes:     Pupils: Pupils are equal, round, and reactive to light.  Cardiovascular:     Rate and Rhythm: Normal rate and regular rhythm.     Comments: 2+ symmetric pulses.  Pulmonary:     Effort: Tachypnea present.     Breath sounds: Rhonchi (scattered throughout, worse at the bases) present.     Comments: SpO2 80% on 6L  via [place], applied NRB @ 10L and subsequently increased to 15L with 3L via [place] underneath with improvement in SpO2 to 90% Abdominal:     Palpations: Abdomen is soft.     Tenderness: There is no abdominal tenderness. There is no guarding or rebound.  Musculoskeletal:     Cervical back: Neck supple. No rigidity.     Right lower leg: No tenderness. No edema.     Left lower leg: No tenderness. No edema.  Skin:    General: Skin is warm and dry.  Neurological:     Mental Status: She is alert.     Comments: Alert, clear speech.      ED Results / Procedures / Treatments   Labs (all labs ordered are listed, but only abnormal results are displayed) Labs Reviewed  SARS CORONAVIRUS 2 BY RT PCR ([facility])  CULTURE, BLOOD (ROUTINE X 2)  CULTURE, BLOOD (ROUTINE X 2)  LACTIC ACID, PLASMA  LACTIC ACID, PLASMA  CBC WITH DIFFERENTIAL/PLATELET  COMPREHENSIVE METABOLIC PANEL  D-DIMER, QUANTITATIVE (NOT AT [facility])  PROCALCITONIN  LACTATE DEHYDROGENASE  FERRITIN  TRIGLYCERIDES  FIBRINOGEN  C-REACTIVE PROTEIN  I-STAT BETA HCG BLOOD, ED ([facility], [facility], [facility] ONLY)  TROPONIN I (HIGH SENSITIVITY)    EKG None  Radiology DG Chest Port 1 View  Result Date: 10/19/2019 CLINICAL DATA:  Shortness of breath.  COVID-19 positive EXAM: PORTABLE CHEST 1 VIEW COMPARISON:  March 24, 2017 FINDINGS: There is airspace consolidation throughout both lower lung regions as well as the left mid lung region in the perihilar region on the left. Heart size and pulmonary vascularity are normal. No adenopathy. No appreciable bone lesions.  IMPRESSION: Apparent multifocal pneumonia, primarily located in the lower lung regions with ill-defined airspace opacity also in the left mid lung. Given the clinical history, there may well be atypical organism pneumonia. There may also be superimposed bacterial pneumonitis. Note that the opacity in the left mid lung has a somewhat nodular configuration, measuring 1.7 x 1.3 cm. Heart size within normal limits.  No adenopathy. Followup PA and lateral chest radiographs recommended in 3-4 weeks following trial of antibiotic therapy to ensure resolution and exclude underlying malignancy. Electronically Signed   By: Lowella Grip III M.D.   On: 10/19/2019 13:00    Procedures .Critical Care Performed by: Amaryllis Dyke, PA-C Authorized by: Amaryllis Dyke, PA-C    CRITICAL CARE Performed by: Kennith Maes   Total critical care time: 30 minutes  Critical care time was exclusive of separately billable procedures and treating other patients.  Critical care was necessary to treat or prevent imminent or life-threatening deterioration.  Critical care was time spent personally by me on the following activities: development of treatment plan with patient and/or surrogate as well as nursing, discussions with consultants, evaluation of patient's response to treatment, examination of patient, obtaining history from patient or surrogate, ordering and performing treatments and interventions, ordering and review of laboratory studies, ordering and review of radiographic studies, pulse oximetry and re-evaluation of patient's condition.    (including critical care time)  Medications Ordered in ED Medications  dexamethasone (DECADRON) injection 6 mg (has no administration in time range)  remdesivir 200 mg in sodium chloride 0.9% 250 mL IVPB (has no administration in time range)    Followed by  remdesivir 100 mg in sodium chloride 0.9 % 100 mL IVPB (has no administration in time range)     ED Course  I have reviewed the triage  vital signs and the nursing notes.  Pertinent labs & imaging results that were available during my care of the patient were reviewed by me and considered in my medical decision making (see chart for details).    MDM Rules/Calculators/A&P                         Patient presents to the ED with complaints of worsening dyspnea in setting of known covid 19 infection.  Patient with acute hypoxic respiratory failure, on NRB w/ [place] on my initial assessment- discussed with nursing and will transition to high flow nasal cannula. Scattered rhonchi throughout. Steroids and remdesivir have been ordered.  Additional history obtained:  Additional history obtained from chart review & nursing note review. Previous records obtained and reviewed.   EKG: No STEMI  Imaging Studies ordered:  I ordered imaging studies which included CXR, I independently visualized and interpreted imaging which showed multifocal pneumonia- detailed radiology report above.   ED Course:  13:20: On 9L HHF with SpO2 93%- Per RN difficult IV access, consulting IV team therefore medication admin & labs pending.  13:40: Internal medicine service plans to admit.  14:38: IV team at bedside  Lab Tests:  I Ordered, reviewed, and interpreted labs, which included:  Pregnancy test: Negative CBC: No significant leukocytosis anemia or platelet dysfunction. CMP, inflammatory markers, troponin pending at time of admission.  Patient has been admitted to the internal medicine residency service.  Findings and plan of care discussed with supervising physician Dr. Tyrone Nine who is in agreement.   Portions of this note were generated with [employer]. Dictation errors may occur despite best attempts at proofreading.  Final Clinical Impression(s) / ED Diagnoses Final diagnoses:  [id number]  Acute hypoxemic respiratory failure [facility])    Rx / DC Orders ED Discharge Orders    None        Amaryllis Dyke, PA-C 10/19/19 [place], DO 10/20/19 [phone]

## 2019-10-20 DIAGNOSIS — J9601 Acute respiratory failure with hypoxia: Secondary | ICD-10-CM

## 2019-10-20 DIAGNOSIS — J1282 Pneumonia due to coronavirus disease 2019: Secondary | ICD-10-CM

## 2019-10-20 DIAGNOSIS — U071 COVID-19: Secondary | ICD-10-CM | POA: Diagnosis not present

## 2019-10-20 MED ORDER — SALINE SPRAY 0.65 % NA SOLN
1.0000 | Freq: Two times a day (BID) | NASAL | Status: DC | PRN
  Administered 2019-10-20: 1 via NASAL
  Filled 2019-10-20: qty 44

## 2019-10-20 MED ORDER — DIPHENHYDRAMINE HCL 25 MG PO CAPS
50.0000 mg | ORAL_CAPSULE | Freq: Four times a day (QID) | ORAL | Status: DC | PRN
  Administered 2019-10-20 – 2019-10-21 (×2): 50 mg via ORAL
  Filled 2019-10-20 (×2): qty 2

## 2019-10-20 MED ORDER — ACETAMINOPHEN 325 MG PO TABS
650.0000 mg | ORAL_TABLET | Freq: Four times a day (QID) | ORAL | Status: DC | PRN
  Administered 2019-10-20 – 2019-10-23 (×2): 650 mg via ORAL
  Filled 2019-10-20 (×3): qty 2

## 2019-10-20 MED ORDER — ALBUTEROL SULFATE HFA 108 (90 BASE) MCG/ACT IN AERS
1.0000 | INHALATION_SPRAY | RESPIRATORY_TRACT | Status: DC | PRN
  Administered 2019-10-20: 2 via RESPIRATORY_TRACT
  Filled 2019-10-20: qty 6.7

## 2019-10-20 NOTE — Progress Notes (Signed)
HD#1 Subjective:   Overnight Events: No acute events overnight.  This morning, Dawn Galvan reports she is feeling tired and breathless, both periodically at rest as well as with any physical exertion, even using the bathroom. She is not sleeping well due to not breathing well as well as beeping monitors. Does note that she finally has an appetite and has been eating and drinking better than she has so far in this illness. Has not had diarrhea since a few days ago. During our interview, titrated her HFNC from 10L to 11L in order to maintain saturations >90%.  Objective:  Vital signs in last 24 hours: Vitals:   10/20/19 0115 10/20/19 0200 10/20/19 0245 10/20/19 0400  BP: 110/76 120/78 118/78 107/75  Pulse: 80 77 80 73  Resp: (!) 27 (!) 30 19 (!) 30  Temp:      TempSrc:      SpO2: 93% 96% 92% 93%  Weight:      Height:       Supplemental O2: HFNC SpO2: 93 % O2 Flow Rate (L/min): 10 L/min --> 11 L/min while in the room   Physical Exam:   Constitutional: tired-appearing and obese woman reclined in hospital bed, in no acute distress Cardiovascular: normal heart sounds, no m/r/g Pulmonary: dyspneic with speaking, desats to high 80s while talking, taking shallow breaths, but no obvious crackles or wheezes, diminished  Filed Weights   10/19/19 1525  Weight: 124.7 kg     Intake/Output Summary (Last 24 hours) at 10/20/2019 0720 Last data filed at 10/19/2019 1919 Gross per 24 hour  Intake 250 ml  Output --  Net 250 ml   Net IO Since Admission: 250 mL [10/20/19 0720]  Pertinent Labs: CBC Latest Ref Rng & Units 10/19/2019 04/29/2017 02/28/2016  WBC 4.0 - 10.5 K/uL 6.2 11.6(H) 9.3  Hemoglobin 12.0 - 15.0 g/dL 17.6 16.0 73.7  Hematocrit 36 - 46 % 46.4(H) 40.1 39.3  Platelets 150 - 400 K/uL 241 319 296    CMP Latest Ref Rng & Units 10/19/2019 02/28/2016 08/18/2013  Glucose 70 - 99 mg/dL 106(Y) 92 694(W)  BUN 6 - 20 mg/dL 9 9 7   Creatinine 0.44 - 1.00 mg/dL 5.46 2.70  Sodium 135  - 145 mmol/L 136 143 141  Potassium 3.5 - 5.1 mmol/L 4.2 4.3 4.6  Chloride 98 - 111 mmol/L 100 104 105  CO2 22 - 32 mmol/L 24 22 23   Calcium 8.9 - 10.3 mg/dL 3.50) 9.0 9.4  Total Protein 6.5 - 8.1 g/dL 6.8 - -  Total Bilirubin 0.3 - 1.2 mg/dL 0.5 - -  Alkaline Phos 38 - 126 U/L 51 - -  AST 15 - 41 U/L 59(H) - -  ALT 0 - 44 U/L 39 - -    Imaging: DG Chest Port 1 View  Result Date: 10/19/2019 CLINICAL DATA:  Shortness of breath.  COVID-19 positive EXAM: PORTABLE CHEST 1 VIEW COMPARISON:  March 24, 2017 FINDINGS: There is airspace consolidation throughout both lower lung regions as well as the left mid lung region in the perihilar region on the left. Heart size and pulmonary vascularity are normal. No adenopathy. No appreciable bone lesions. IMPRESSION: Apparent multifocal pneumonia, primarily located in the lower lung regions with ill-defined airspace opacity also in the left mid lung. Given the clinical history, there may well be atypical organism pneumonia. There may also be superimposed bacterial pneumonitis. Note that the opacity in the left mid lung has a somewhat nodular configuration, measuring 1.7 x  1.3 cm. Heart size within normal limits.  No adenopathy. Followup PA and lateral chest radiographs recommended in 3-4 weeks following trial of antibiotic therapy to ensure resolution and exclude underlying malignancy. Electronically Signed   By: Bretta Bang III M.D.   On: 10/19/2019 13:00    Assessment/Plan:   Active Problems:   Pneumonia due to COVID-19 virus    has a past medical history of Carpal tunnel syndrome, Carpal tunnel syndrome of left wrist (07/2013), Family history of breast cancer, Family history of prostate cancer, GERD (gastroesophageal reflux disease), Headache(784.0), History of vertigo, Obesity, Osteoarthritis, PONV (postoperative nausea and vomiting), Rash (08/10/2013), and Sleep apnea.  Patient Summary:  Dawn Galvan is a 44 year old unvaccinated woman who  presented on 10/19/19 with a several day history of progressive cough and shortness of breath in the setting of testing positive for COVID-19 on 10/13/19. She is s/p outpatient monoclonal antibody treatment on 10/17/19.  She is on hospital day 1.  COVID-19 pneumonia - Currently on 10L HFNC from 8L HFNC yesterday/overnight - this is day 2 of remdesivir, decadron, and baricitinib - will trend inflammatory markers - supportive care with antipyretics, anti-emetics - Robitussin DM, tussionex for cough  - flutter valve, IS - self-prone if able - albuterol 1-2 puffs q4h PRN   Diet: Regular IVF: None,None VTE: Enoxaparin Code: Full   Dispo: Anticipated discharge to Home in 4-6 days pending improvement in respiratory status.   Alphonzo Severance, MD [facility], PGY-1 Date 10/20/2019 Time 7:20 AM Pager: [phone] Please contact the on call pager after 5 pm and on weekends at [phone].

## 2019-10-20 NOTE — ED Notes (Signed)
Breakfast Ordered 

## 2019-10-20 NOTE — ED Notes (Signed)
Gave patient breakfast tray patient is resting with call bell in reach 

## 2019-10-21 DIAGNOSIS — U071 COVID-19: Principal | ICD-10-CM

## 2019-10-21 DIAGNOSIS — J9601 Acute respiratory failure with hypoxia: Secondary | ICD-10-CM | POA: Diagnosis not present

## 2019-10-21 DIAGNOSIS — J1282 Pneumonia due to coronavirus disease 2019: Secondary | ICD-10-CM | POA: Diagnosis not present

## 2019-10-21 MED ORDER — SODIUM CHLORIDE 0.9% FLUSH
10.0000 mL | Freq: Two times a day (BID) | INTRAVENOUS | Status: DC
  Administered 2019-10-21 – 2019-10-24 (×5): 10 mL

## 2019-10-21 MED ORDER — SIMETHICONE 80 MG PO CHEW
80.0000 mg | CHEWABLE_TABLET | Freq: Four times a day (QID) | ORAL | Status: DC | PRN
  Administered 2019-10-21: 80 mg via ORAL
  Filled 2019-10-21: qty 1

## 2019-10-21 MED ORDER — SODIUM CHLORIDE 0.9% FLUSH
10.0000 mL | INTRAVENOUS | Status: DC | PRN
  Administered 2019-10-24: 10 mL

## 2019-10-21 MED ORDER — POLYETHYLENE GLYCOL 3350 17 G PO PACK: 17.0000 g | PACK | Freq: Every day | ORAL | Status: DC | PRN

## 2019-10-21 MED ORDER — LIP MEDEX EX OINT
TOPICAL_OINTMENT | CUTANEOUS | Status: DC | PRN
  Filled 2019-10-21: qty 7

## 2019-10-21 MED ORDER — SENNA 8.6 MG PO TABS: 1.0000 | ORAL_TABLET | Freq: Every day | ORAL | Status: DC | PRN

## 2019-10-21 NOTE — Progress Notes (Signed)
HD#2 Subjective:   No acute events overnight. The patient refused labs yesterday and earlier this morning as she is a difficult stick. Is amenable to midline placement.  This morning, Ms. Newmark expresses anxiety relating to her inability to speak much without becoming dyspneic. She became tearful at times. Reports that she feels frustrated when she gets asked questions when she has just woken up. Reports feeling like her breathing is about the same as yesterday, but she continues to have a good appetite and has been eating and drinking fairly well. During our evaluation, she FaceTimed with her mom who had questions regarding Denajah's treatment and prognosis. Their questions were answered, and they expressed understanding.   Objective:  Vital signs in last 24 hours: Vitals:   10/20/19 1445 10/20/19 2118 10/20/19 2347 10/21/19 0617  BP: 121/78 (!) 127/92 117/86 (!) 136/93  Pulse:  79 74 81  Resp: (!) 25 18 (!) 22   Temp: 97.9 F (36.6 C) 98.1 F (36.7 C)  98.5 F (36.9 C)  TempSrc: Oral Oral  Oral  SpO2: 91% 94% 96% 97%  Weight:    133.6 kg  Height:       Supplemental O2: HFNC SpO2: 97 % O2 Flow Rate (L/min): 11 L/min    Physical Exam:   Constitutional: tired-appearing and obese woman reclined in hospital bed, in no acute distress, tearful at times this morning Cardiovascular: normal heart sounds, no m/r/g Pulmonary: dyspneic with speaking, taking shallow breaths, but no obvious crackles or wheezes, diminished  Filed Weights   10/19/19 1525 10/21/19 0617  Weight: 124.7 kg 133.6 kg    No intake or output data in the 24 hours ending 10/21/19 1123 Net IO Since Admission: 250 mL [10/21/19 1123]  Pertinent Labs: CBC Latest Ref Rng & Units 10/19/2019 04/29/2017 02/28/2016  WBC 4.0 - 10.5 K/uL 6.2 11.6(H) 9.3  Hemoglobin 12.0 - 15.0 g/dL 17.5 10.2 58.5  Hematocrit 36 - 46 % 46.4(H) 40.1 39.3  Platelets 150 - 400 K/uL 241 319 296    CMP Latest Ref Rng & Units 10/19/2019 02/28/2016  08/18/2013  Glucose 70 - 99 mg/dL 277(O) 92 242(P)  BUN 6 - 20 mg/dL 9 9 7   Creatinine 0.44 - 1.00 mg/dL 5.36 1.44  Sodium 135 - 145 mmol/L 136 143 141  Potassium 3.5 - 5.1 mmol/L 4.2 4.3 4.6  Chloride 98 - 111 mmol/L 100 104 105  CO2 22 - 32 mmol/L 24 22 23   Calcium 8.9 - 10.3 mg/dL 3.15) 9.0 9.4  Total Protein 6.5 - 8.1 g/dL 6.8 - -  Total Bilirubin 0.3 - 1.2 mg/dL 0.5 - -  Alkaline Phos 38 - 126 U/L 51 - -  AST 15 - 41 U/L 59(H) - -  ALT 0 - 44 U/L 39 - -    Imaging: No results found.  Assessment/Plan:   Active Problems:   COVID-19   Acute hypoxemic respiratory failure (HCC)   Pneumonia due to COVID-19 virus   Patient Summary:  Ms. Junio is a 44 year old unvaccinated woman who presented on 10/19/19 with a several day history of progressive cough and shortness of breath in the setting of testing positive for COVID-19 on 10/13/19. She is s/p outpatient monoclonal antibody treatment on 10/17/19.  She is on hospital day 2.  COVID-19 pneumonia - Currently on 11L HFNC - this is day 3 of remdesivir, decadron, and baricitinib - plan to trend inflammatory markers; patient has difficult venous access but will be getting a midline -  supportive care with antipyretics, anti-emetics - Robitussin DM, tussionex for cough  - flutter valve, IS - self-prone if able - albuterol 1-2 puffs q4h PRN   Diet: Regular IVF: None,None VTE: Enoxaparin Code: Full   Dispo: Anticipated discharge to Home in 4-6 days pending improvement in respiratory status.   Alphonzo Severance, MD [facility], PGY-1 Date 10/21/2019 Time 11:23 AM Pager: [phone] Please contact the on call pager after 5 pm and on weekends at [phone].

## 2019-10-22 ENCOUNTER — Encounter: Payer: Self-pay | Admitting: Internal Medicine

## 2019-10-22 DIAGNOSIS — J1282 Pneumonia due to coronavirus disease 2019: Secondary | ICD-10-CM | POA: Diagnosis not present

## 2019-10-22 DIAGNOSIS — U071 COVID-19: Secondary | ICD-10-CM | POA: Diagnosis not present

## 2019-10-22 DIAGNOSIS — K5900 Constipation, unspecified: Secondary | ICD-10-CM | POA: Diagnosis not present

## 2019-10-22 DIAGNOSIS — J9601 Acute respiratory failure with hypoxia: Secondary | ICD-10-CM | POA: Diagnosis not present

## 2019-10-22 MED ORDER — WHITE PETROLATUM EX OINT
TOPICAL_OINTMENT | CUTANEOUS | Status: DC | PRN
  Filled 2019-10-22: qty 28.35

## 2019-10-22 NOTE — Progress Notes (Signed)
Pt's midline cath.has no blood return.Pt.refused blood to be drawn peripherally. MD on call Dr.Jinwala made aware.

## 2019-10-22 NOTE — Progress Notes (Signed)
   Subjective:   Yesterday evening had a midline placed, however no blood return. Ms. Geyer does not want blood drawn peripherally. Also overnight the patient endorsed gas pains and mild constipation. Given simethicone and senna.  Ms. Roes expresses her frustrations regarding the care she has received thus far. We provided empathetic listening. Fortunately, she demonstrated she is able to speak in long stretches of sentences without significant dyspnea. On HFNC 11 lpm with O2 sats 92-96%. Continues to have productive cough. Appetite is good. Counseled on using incentive spirometer, flutter valve, and self-proning. Reiterated the importance of getting up and out of bed. Discussed that we will not be able to continue remdesivir as we have been unable to monitor her liver function with lab work.   Objective:  Vital signs in last 24 hours: Vitals:   10/21/19 1910 10/21/19 2009 10/22/19 0338 10/22/19 0821  BP:  139/88 (!) 147/83   Pulse: 77 73 74 77  Resp: (!) 21 15 20 15   Temp:  98.5 F (36.9 C) 98.2 F (36.8 C)   TempSrc:  Oral Oral   SpO2: 94% 90% 91% 94%  Weight:   126 kg   Height:       Constitutional: appears more energetic and less fatigued this morning, though frustrated  Pulmonary: speaks in full sentences with minimal dyspnea, oxygen saturation 92-96% on HFNC 11 lpm  Assessment/Plan:  Active Problems:   COVID-19   Acute hypoxemic respiratory failure (HCC)   Pneumonia due to COVID-19 virus  Ms. Maney is a 44 year old unvaccinated woman who presented on 10/19/19 with a several day history of progressive cough and shortness of breath in the setting of testing positive for COVID-19 on 10/13/19. She is s/p outpatient monoclonal antibody treatment on 10/17/19.  She is on hospital day 2.  COVID-19 pneumonia - Currently on 11L HFNC with improved dyspnea, will wean as tolerated - s/p 3 days of remdesivir; will discontinue at this time as we are unable to monitor her liver and  kidney function due to difficult IV access - this is day 4 of decadron and baricitinib - unable to trend inflammatory markers secondary to difficult IV access - supportive care with antipyretics, anti-emetics - Robitussin DM, tussionex for cough  - flutter valve, IS - self-prone if able - albuterol 1-2 puffs q4h PRN - PT eval and treat   Diet: Regular VTE: Enoxaparin Code: Full Dispo: Anticipated discharge to Home in 3-5 days pending improvement in respiratory status.    10/19/19, MD 10/22/2019, 11:01 AM Pager: [phone] After 5pm on weekdays and 1pm on weekends: On Call pager [phone]

## 2019-10-23 ENCOUNTER — Telehealth: Payer: Self-pay | Admitting: Internal Medicine

## 2019-10-23 DIAGNOSIS — J9601 Acute respiratory failure with hypoxia: Secondary | ICD-10-CM | POA: Diagnosis not present

## 2019-10-23 DIAGNOSIS — J1282 Pneumonia due to coronavirus disease 2019: Secondary | ICD-10-CM | POA: Diagnosis not present

## 2019-10-23 DIAGNOSIS — U071 COVID-19: Secondary | ICD-10-CM | POA: Diagnosis not present

## 2019-10-23 MED ORDER — POLYETHYLENE GLYCOL 3350 17 G PO PACK
17.0000 g | PACK | Freq: Every day | ORAL | Status: DC | PRN
  Filled 2019-10-23: qty 1

## 2019-10-23 NOTE — Telephone Encounter (Signed)
Returned call to patient's mom. Advised per Dr. Maryla Morrow to ask for letter from In-patient Team as they are following patient's progress and will know expected date of discharge. Mom will ask Dr. Alphonzo Severance for letter. Kinnie Feil, BSN, RN-BC

## 2019-10-23 NOTE — Telephone Encounter (Signed)
Pls contact pt mom regarding letter [phone]

## 2019-10-23 NOTE — Progress Notes (Signed)
   Subjective:   Yesterday evening titrated from HFNC 11L to 8L, satting 92-95%.  This morning, Dawn Galvan is sitting on the edge of the bed, appears comfortable. Reports that she is breathing comfortably. Feels motivated to get up and out of bed into the chair today, wants to work with physical therapy. During our conversation, she maintained her saturations between 92-95% as she shifted in bed. Demonstrates her use of the incentive spirometer. Has not tried self-proning yet.   Objective:  Vital signs in last 24 hours: Vitals:   10/23/19 0028 10/23/19 0157 10/23/19 0159 10/23/19 0530  BP:    (!) 147/94  Pulse: 82 74 85 79  Resp: 20  (!) 23 20  Temp: 98.3 F (36.8 C)   98.1 F (36.7 C)  TempSrc: Oral   Oral  SpO2: 92% 95% 94% 94%  Weight:    127 kg  Height:       Constitutional: brighter and more energetic this morning Pulmonary: speaks in full sentences with minimal dyspnea, oxygen saturation 92-96% on HFNC 8 lpm  Assessment/Plan:  Active Problems:   COVID-19   Acute hypoxemic respiratory failure (HCC)   Pneumonia due to COVID-19 virus  Dawn Galvan is a 44 year old unvaccinated woman who presented on 10/19/19 with a several day history of progressive cough and shortness of breath in the setting of testing positive for COVID-19 on 10/13/19. She is s/p outpatient monoclonal antibody treatment on 10/17/19.  She is on hospital day 3.  COVID-19 pneumonia - Currently on 8L HFNC with improved dyspnea, will wean as tolerated - s/p 3 days of remdesivir; discontinued because we were unable to monitor her liver and kidney function due to difficult IV access - this is day 5 of decadron and baricitinib - unable to trend inflammatory markers secondary to difficult IV access - supportive care with antipyretics, anti-emetics - Robitussin DM, tussionex for cough  - flutter valve, IS - self-prone if able - albuterol 1-2 puffs q4h PRN - PT eval and treat   Diet: Regular VTE:  Enoxaparin Code: Full Dispo: Anticipated discharge to Home in 3-5 days pending improvement in respiratory status.   Alphonzo Severance, MD 10/23/2019, 6:22 AM Pager: [phone] After 5pm on weekdays and 1pm on weekends: On Call pager [phone]

## 2019-10-23 NOTE — Evaluation (Signed)
Physical Therapy Evaluation Patient Details Name: Dawn Galvan MRN: [id number] DOB: August 09, 1975 Today's Date: 10/23/2019   History of Present Illness  44 y/o unvaccinated female who was sent from [facility] with progressive shortness of breath and cough. She tested positive for COVID-19 on 10/13/19 and got the monoclonal antibody infusion on 10/17/19. She noticed worsening shortness of breath and cough beginning 10/18/19. On evaluation in clinic she was 81%O2 on RA. Subsequently sent to ED for admission.   Clinical Impression  PTA pt living with 63 yo son in 2 story townhouse with level entry. Pt bed and bath upstairs. 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 Pt is currently limited in safe mobility by increased O2 demand especially with mobility (see General Comments). Pt is currently mod I for transfers and min guard for ambulation of 20 feet without AD. Pt voices concern for son who is home alone. Reports family is trying their best but that he is COVID + as well. Educated pt that given level of mobility, pt would be best to live on first floor until she regains endurance. PT recommending HHPT to work on improving endurance and strength. PT will continue to follow acutely.    Follow Up Recommendations Home health PT;Supervision - Intermittent    Equipment Recommendations  3in1 (PT)       Precautions / Restrictions Precautions Precautions: None Precaution Comments: COVID+ Restrictions Weight Bearing Restrictions: No      Mobility  Bed Mobility               General bed mobility comments: up in recliner on entry   Transfers Overall transfer level: Modified independent Equipment used: None             General transfer comment: increased time and effort to come to standing, dizziness when she got up too quickly  Ambulation/Gait Ambulation/Gait assistance: Min guard Gait Distance (Feet): 20 Feet Assistive device:  None Gait Pattern/deviations: Step-through pattern;Decreased stride length;Shuffle Gait velocity: slowed Gait velocity interpretation: <1.31 ft/sec, indicative of household ambulator General Gait Details: min guard for slow, shuffling gait, mild unsteadiness, no overt LoB         Balance Overall balance assessment: Mild deficits observed, not formally tested                                           Pertinent Vitals/Pain Pain Assessment: No/denies pain    Home Living Family/patient expects to be discharged to:: Private residence Living Arrangements: Children (44 yo son) Available Help at Discharge: Family;Available PRN/intermittently Type of Home: Apartment (2 story town home) Home Access: Level entry     Home Layout: Two level;1/2 bath on main level;Bed/bath upstairs Home Equipment: None      Prior Function Level of Independence: Independent         Comments: 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[employer], driving      Higher education careers adviser        Extremity/Trunk Assessment   Upper Extremity Assessment Upper Extremity Assessment: Generalized weakness    Lower Extremity Assessment Lower Extremity Assessment: Generalized weakness       Communication   Communication: No difficulties  Cognition Arousal/Alertness: Awake/alert Behavior During Therapy: WFL for tasks assessed/performed Overall Cognitive Status: Within Functional Limits for tasks assessed  General Comments General comments (skin integrity, edema, etc.): Pt on 8L O2 via HFNC with inappropriate extension on entry, with SaO2 100%O2, educated pt to fact that although the line is short an extension reduces O2 flow. With ambulation on 8L O2 via [place] SaO2 >90%O2 throughout despite 3/4 DOE, once back in chair SaO2 100%O2, bumped O2 down to 7L HFNC and SaO2 100%O2, HR 76bpm at rest with ambulation HR 98bpm    Exercises Other  Exercises Other Exercises: IS x 10 max Other Exercises: flutter valve x 10   Assessment/Plan    PT Assessment Patient needs continued PT services  PT Problem List Decreased strength;Decreased activity tolerance;Decreased balance;Decreased mobility;Cardiopulmonary status limiting activity       PT Treatment Interventions DME instruction;Gait training;Stair training;Functional mobility training;Therapeutic activities;Therapeutic exercise;Balance training;Cognitive remediation;Patient/family education    PT Goals (Current goals can be found in the Care Plan section)  Acute Rehab PT Goals Patient Stated Goal: go home to take care of son  PT Goal Formulation: With patient Time For Goal Achievement: 11/06/19 Potential to Achieve Goals: Good    Frequency Min 3X/week   Barriers to discharge Decreased caregiver support         AM-PAC PT "6 Clicks" Mobility  Outcome Measure Help needed turning from your back to your side while in a flat bed without using bedrails?: None Help needed moving from lying on your back to sitting on the side of a flat bed without using bedrails?: None Help needed moving to and from a bed to a chair (including a wheelchair)?: None Help needed standing up from a chair using your arms (e.g., wheelchair or bedside chair)?: None Help needed to walk in hospital room?: A Little Help needed climbing 3-5 steps with a railing? : A Little 6 Click Score: 22    End of Session Equipment Utilized During Treatment: Oxygen Activity Tolerance: Patient limited by fatigue Patient left: in chair;with call bell/phone within reach Nurse Communication: Mobility status PT Visit Diagnosis: Unsteadiness on feet (R26.81);Muscle weakness (generalized) (M62.81);Difficulty in walking, not elsewhere classified (R26.2)    Time: 2595-6387 PT Time Calculation (min) (ACUTE ONLY): 28 min   Charges:   PT Evaluation $PT Eval Moderate Complexity: 1 Mod PT Treatments $Therapeutic  Activity: 8-22 mins        Jade Burright B. Beverely Risen PT, DPT Acute Rehabilitation Services Pager [phone] Office [phone]   Elon Alas Fleet 10/23/2019, 3:52 PM

## 2019-10-23 NOTE — Progress Notes (Signed)
While NT [facility]) was in pt room, the pt requested that the O2 be turned down that "it was burning her nose and that she could not do this all night." However, she does have humidification along with saline solution and petroleum jelly to put in her nose to help with symptoms. Pt want the O2 down from 11L to 8L- Pt is at 95% at 8L- I will continue to monitor her closely.

## 2019-10-24 DIAGNOSIS — J1282 Pneumonia due to coronavirus disease 2019: Secondary | ICD-10-CM | POA: Diagnosis not present

## 2019-10-24 DIAGNOSIS — J9601 Acute respiratory failure with hypoxia: Secondary | ICD-10-CM | POA: Diagnosis not present

## 2019-10-24 DIAGNOSIS — R531 Weakness: Secondary | ICD-10-CM | POA: Diagnosis not present

## 2019-10-24 DIAGNOSIS — U071 COVID-19: Secondary | ICD-10-CM | POA: Diagnosis not present

## 2019-10-24 LAB — CULTURE, BLOOD (ROUTINE X 2)
Culture: NO GROWTH
Culture: NO GROWTH

## 2019-10-24 MED ORDER — DEXAMETHASONE 6 MG PO TABS: 6.0000 mg | ORAL_TABLET | ORAL | 0 refills | Status: AC

## 2019-10-24 NOTE — TOC Transition Note (Signed)
Transition of Care [facility]) - CM/SW Discharge Note   Patient Details  Name: Dawn Galvan MRN: [id number] Date of Birth: [birth year]  [facility] CM/SW Contact:  Gala Lewandowsky, RN Phone Number: 10/24/2019, 4:30 PM   Clinical Narrative: Case Manager was able to secure services with [facility] for Home Health Physical and Occupational Therapies. Services will have to be delayed a week 2/2 staffing. Physician and Patient aware of the delay. Orders to reflect the delay per provider. DME will be delivered to the room prior to transition home. No further needs from Case Manager at this time.     Final next level of care: Home w Home Health Services Barriers to Discharge: No Barriers Identified   Patient Goals and CMS Choice Patient states their goals for this hospitalization and ongoing recovery are:: to return home with home health. CMS Medicare.gov Compare Post Acute Care list provided to:: Patient Choice offered to / list presented to : Patient (Medicare.gov list offered, no preference identified.)   Discharge Plan and Services In-house Referral: NA Discharge Planning Services: CM Consult Post Acute Care Choice: Home Health          DME Arranged: Walker rolling, Bedside commode, Oxygen DME Agency: [facility] Date DME Agency Contacted: 10/24/19 Time DME Agency Contacted: [phone] Representative spoke with at DME Agency: Kirstie Mirza at [employer] for O2, Spoke to McDonald Chapel at [employer] for bedside commode and rolling walker. HH Arranged: PT, OT HH Agency: [facility] Date HH Agency Contacted: 10/24/19 Time HH Agency Contacted: [phone] (office can accept the patiet in a week- due to staffing.) Representative spoke with at [facility] Agency: Dawn   Readmission Risk Interventions No flowsheet data found.

## 2019-10-24 NOTE — Progress Notes (Signed)
Pt no longer on O2, saturation is 95-100% Will watch closely

## 2019-10-24 NOTE — TOC Initial Note (Addendum)
Transition of Care [facility]) - Initial/Assessment Note    Patient Details  Name: Dawn Galvan MRN: [id number] Date of Birth: 13-Sep-1975  Transition of Care [facility]) CM/SW Contact:    Hyman Hopes, RN Phone Number: 10/24/2019, 3:10 PM  Clinical Narrative:   Patient presented with shortness of breath.  Prior to arrival patient was from home with son and mother.  Plan for patient to transition home with home health services.  Case manager spoke to patient and officered Medicare.gov list for home health physical and occupational services.  Patient did not have a preference on home health agencies.  Case manager called [facility] to see if they can service the patient, awaiting call back.   Therapy recommendations for 3 in 1 and patient requested a rolling walker.  Physician stated that patient will need home oxygen-referral sent to [employer] for all equipment.  Durable Medical Equipment will be delivered to the room prior to transition home.      8.31.21 1525:  [place] notified case manager that they are not able to service for home PT/OT.  Case manager placed call with [facility] to see if they can service the patient, awaiting call back.   Expected Discharge Plan: [place] Barriers to Discharge: No Barriers Identified   Patient Goals and CMS Choice Patient states their goals for this hospitalization and ongoing recovery are:: to return home with home health. CMS Medicare.gov Compare Post Acute Care list provided to:: Patient Choice offered to / list presented to : Patient (Medicare.gov list offered, no preference identified.)  Expected Discharge Plan and Services Expected Discharge Plan: [place] In-house Referral: NA Discharge Planning Services: CM Consult Post Acute Care Choice: [place] arrangements for the past 2 months: Single Family Home                 DME Arranged: Walker rolling, Bedside commode, Oxygen DME Agency: [facility] Date  DME Agency Contacted: 10/24/19 Time DME Agency Contacted: [phone] Representative spoke with at DME Agency: Leandro Reasoner at [place] for O2, Spoke to [place] at [employer] for bedside commode and rolling walker. HH Arranged: PT, OT HH Agency: [place] Date [facility] Agency Contacted: 10/24/19 Time [place]: 0737 Representative spoke with at [place]: Spoke to [place] at [place].  Prior Living Arrangements/Services Living arrangements for the past 2 months: [place] with:: Self, Parents, Minor Children Patient language and need for interpreter reviewed:: No Do you feel safe going back to the place where you live?: Yes        Care giver support system in place?: Yes (comment)   Criminal Activity/Legal Involvement Pertinent to Current Situation/Hospitalization: No - Comment as needed  Activities of Daily Living    Permission Sought/Granted Permission sought to share information with : Case Manager Permission granted to share information with : Yes, Verbal Permission Granted     Permission granted to share info w AGENCY: [facility] and [place]      Emotional Assessment Appearance:: Appears stated age Attitude/Demeanor/Rapport: Engaged Affect (typically observed): Appropriate Orientation: : Oriented to Self, Oriented to Place, Oriented to  Time, Oriented to Situation Alcohol / Substance Use: Never Used Psych Involvement: No (comment)  Admission diagnosis:  Acute hypoxemic respiratory failure (HCC) [J96.01] Pneumonia due to COVID-19 virus [U07.1, J12.82] COVID-19 [U07.1] Patient Active Problem List   Diagnosis Date Noted  . Acute hypoxemic respiratory failure ([place]) 10/19/2019  . Pneumonia due to COVID-19 virus 10/19/2019  .  COVID-19 10/16/2019  . Nexplanon in place 05/29/2019  . At high risk for breast cancer 10/05/2018  . 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 Genetic testing 09/10/2017  . Family history of breast  cancer   . Family history of prostate cancer   . Abnormal chest x-ray 03/24/2017  . Pharyngitis 07/14/2016  . Heel pain 06/25/2016  . Urinary frequency 02/28/2016  . S/P bariatric surgery 09/04/2015  . Depression 08/05/2015  . Muscle spasm of back 08/05/2015  . Carpal tunnel syndrome 08/05/2015  . Prediabetes 06/08/2014  . Morbid obesity ([place]) 06/12/2011   PCP:  Delice Bison, DO Pharmacy:   [id number] [id number]: [phone]: [phone]  Readmission Risk Interventions No flowsheet data found.

## 2019-10-24 NOTE — Progress Notes (Signed)
SATURATION QUALIFICATIONS: (This note is used to comply with regulatory documentation for home oxygen)  Patient Saturations on Room Air at Rest =94%  Patient Saturations on Room Air while Ambulating = 89%  Patient Saturations on 1 Liters of oxygen while Ambulating = 93%

## 2019-10-24 NOTE — Progress Notes (Signed)
   Subjective:   Overnight weaned to room air.  This morning, Dawn Galvan is doing well. She reports she did well overnight on room air. Has ambulated in her room and with nursing. Cough is persistent but improving. She is looking forward to going home to continue with home health services as she fully recuperates. Discussed completing the 10-day course of steroids she started here in the hospital. Discussed quarantine time period and eligibility for a COVID vaccine.  After rounds, the patient was ambulated with pulse ox and found to require 1 L [place] while ambulating to maintain sats >93%.  Objective:  Vital signs in last 24 hours: Vitals:   10/24/19 0239 10/24/19 0615 10/24/19 0630 10/24/19 0645  BP: 114/81     Pulse: 71 (!) 54 69 (!) 59  Resp: 20     Temp: 98.5 F (36.9 C)     TempSrc: Oral     SpO2: 91% 99% 99% 93%  Weight: 126.7 kg     Height:       Constitutional: bright and energetic this morning, well-appearing, in no acute distress Pulmonary: speaks in full sentences with minimal dyspnea, oxygen saturation 93-98% on room air  Assessment/Plan:  Active Problems:   COVID-19   Acute hypoxemic respiratory failure (HCC)   Pneumonia due to COVID-19 virus  Dawn Galvan is a 44 year old unvaccinated woman who presented on 10/19/19 with a several day history of progressive cough and shortness of breath in the setting of testing positive for COVID-19 on 10/13/19. She is s/p outpatient monoclonal antibody treatment on 10/17/19.  She is on hospital day 4. Will discharge home this afternoon with home health PT/OT as well as 1 L [place].  COVID-19 pneumonia - Currently on room air with improved dyspnea - Qualified for 1 L [place] home oxygen - s/p 3 days of remdesivir; discontinued because we were unable to monitor her liver and kidney function due to difficult IV access - this is day 6 of decadron and baricitinib. Patient will discharge home with 4 additional days of decadron to complete 10-day  course. - unable to trend inflammatory markers secondary to difficult IV access - supportive care with antipyretics, anti-emetics - Robitussin DM, tussionex for cough  - flutter valve, IS  Diet: Regular Code: Full Dispo: Anticipated discharge to Home today  Alphonzo Severance, MD 10/24/2019, 7:52 AM Pager: [phone] After 5pm on weekdays and 1pm on weekends: On Call pager [phone]

## 2019-10-24 NOTE — Discharge Summary (Signed)
Name: Dawn Galvan MRN: [id number] DOB: [birth year] 44 y.o. PCP: Bridget Hartshorn, DO  Date of Admission: 10/19/2019 12:19 PM Date of Discharge: 10/24/19 Attending Physician: Dr. Criselda Peaches  Discharge Diagnosis: Active Problems:   COVID-19   Acute hypoxemic respiratory failure (HCC)   Pneumonia due to COVID-19 virus    Discharge Medications: Allergies as of 10/24/2019      Reactions   Other Itching, Other (See Comments)   Seasonal allergies- Itchy eyes, runny nose, etc..      Medication List    STOP taking these medications   fluconazole 150 MG tablet Commonly known as: Diflucan   metroNIDAZOLE 500 MG tablet Commonly known as: FLAGYL   phentermine 37.5 MG capsule   phentermine 37.5 MG tablet Commonly known as: ADIPEX-P   sertraline 50 MG tablet Commonly known as: ZOLOFT     TAKE these medications   acetaminophen 500 MG tablet Commonly known as: TYLENOL Take 500-1,000 mg by mouth every 6 (six) hours as needed for mild pain, fever or headache.   benzonatate 100 MG capsule Commonly known as: Tessalon Perles Take 1 capsule (100 mg total) by mouth every 6 (six) hours as needed for cough.   dexamethasone 6 MG tablet Commonly known as: DECADRON Take 1 tablet (6 mg total) by mouth daily for 4 days. Start taking on: October 25, 2019   dextromethorphan 15 MG/5ML syrup Take 10 mLs (30 mg total) by mouth 4 (four) times daily as needed for cough.   naproxen 250 MG tablet Commonly known as: Naprosyn Take 1 tablet (250 mg total) by mouth 4 (four) times daily as needed for up to 10 days.   ondansetron 8 MG disintegrating tablet Commonly known as: ZOFRAN-ODT Take 8 mg by mouth 3 (three) times daily as needed for nausea or vomiting (DISSOLVE ORALLY).   topiramate 25 MG tablet Commonly known as: TOPAMAX Take 25 mg by mouth 2 (two) times daily.            Durable Medical Equipment  (From admission, onward)         Start     Ordered   10/24/19 1515   DME Walker  Once       Question Answer Comment  Walker: With 5 Inch Wheels   Patient needs a walker to treat with the following condition Generalized weakness      10/24/19 1517   10/24/19 1454  DME 3-in-1  Once        10/24/19 1517   10/24/19 1454  DME Oxygen  Once       Question Answer Comment  Length of Need 6 Months   Mode or (Route) Nasal cannula   Liters per Minute 1   Oxygen conserving device Yes   Oxygen delivery system Gas      10/24/19 1517   10/24/19 1439  For home use only DME oxygen  Once       Question Answer Comment  Length of Need 6 Months   Liters per Minute 1   Frequency Continuous (stationary and portable oxygen unit needed)   Oxygen conserving device Yes   Oxygen delivery system Gas      10/24/19 1439   10/24/19 0724  For home use only DME Bedside commode  Once       Question:  Patient needs a bedside commode to treat with the following condition  Answer:  COVID-19   10/24/19 0723          Disposition and follow-up:  [name] was discharged from [facility] in Stable condition.  At the hospital follow up visit please address:  1.  Follow-up:  A.COVID pneumonia - will discharge patient on 1 L [place] at home. She will need a 1 week follow up at [facility].  2.  Labs / imaging needed at time of follow-up: CBC, CMP  3.  Pending labs/ test needing follow-up: none  Follow-up Appointments:  Follow-up Information    [facility] Follow up.   Why: [employer], rolling walker and bedside commode will be delivered to room prior to transition home. Contact information: [address] [place] [address] [phone]              [facility] - the clinic will call to schedule a 1 week f/u appt.   Hospital Course by problem list:  1. COVID pneumonia - Patient presented to [facility] on 08/26 with progressive shortness of breath and cough. She tested positive for COVID-19 pneumonia. He is s/p  monoclonal antibody infusion on 08/24 with worsening SHOB. Patient was treated with supplemental oxygen, 3 days of Remdesivir, 6 days of decadron, and 6 days of Baracitinib with improvement of her symptoms. She was discharge with 1L supplemental O2 and 4 days of additional steroids. She will be set up with a 1 week follow up in the internal medicine clinic.   Discharge Vitals:   BP 110/71   Pulse (!) 106   Temp 97.8 F (36.6 C) (Oral)   Resp 20   Ht 5\' 4"  (1.626 m)   Wt 126.7 kg   SpO2 90%   BMI 47.95 kg/m   Pertinent Labs, Studies, and Procedures:  CBC Latest Ref Rng & Units 10/19/2019 04/29/2017 02/28/2016  WBC 4.0 - 10.5 K/uL 6.2 11.6(H) 9.3  Hemoglobin 12.0 - 15.0 g/dL 04/27/2016 00.9 38.1  Hematocrit 36 - 46 % 46.4(H) 40.1 39.3  Platelets 150 - 400 K/uL 241 319 296    CMP Latest Ref Rng & Units 10/19/2019 02/28/2016 08/18/2013  Glucose 70 - 99 mg/dL 08/20/2013) 92 937(J)  BUN 6 - 20 mg/dL 9 9 7   Creatinine 0.44 - 1.00 mg/dL 696(V 8.93  Sodium 135 - 145 mmol/L 136 143 141  Potassium 3.5 - 5.1 mmol/L 4.2 4.3 4.6  Chloride 98 - 111 mmol/L 100 104 105  CO2 22 - 32 mmol/L 24 22 23   Calcium 8.9 - 10.3 mg/dL 8.10) 9.0 9.4  Total Protein 6.5 - 8.1 g/dL 6.8 - -  Total Bilirubin 0.3 - 1.2 mg/dL 0.5 - -  Alkaline Phos 38 - 126 U/L 51 - -  AST 15 - 41 U/L 59(H) - -  ALT 0 - 44 U/L 39 - -    DG Chest Port 1 View  Result Date: 10/19/2019 CLINICAL DATA:  Shortness of breath.  COVID-19 positive EXAM: PORTABLE CHEST 1 VIEW COMPARISON:  March 24, 2017 FINDINGS: There is airspace consolidation throughout both lower lung regions as well as the left mid lung region in the perihilar region on the left. Heart size and pulmonary vascularity are normal. No adenopathy. No appreciable bone lesions. IMPRESSION: Apparent multifocal pneumonia, primarily located in the lower lung regions with ill-defined airspace opacity also in the left mid lung. Given the clinical history, there may well be atypical organism  pneumonia. There may also be superimposed bacterial pneumonitis. Note that the opacity in the left mid lung has a somewhat nodular configuration, measuring 1.7 x 1.3 cm.  Heart size within normal limits.  No adenopathy. Followup PA and lateral chest radiographs recommended in 3-4 weeks following trial of antibiotic therapy to ensure resolution and exclude underlying malignancy. Electronically Signed   By: Bretta Bang III M.D.   On: 10/19/2019 13:00     Discharge Instructions: Discharge Instructions    Discharge instructions   Complete by: As directed    Ms. Cogdell, it was a pleasure taking care of you. You were treated for COVID-19 pneumonia. You were treated with remdesivir, steroid, and baricitnib. You were also supported with supplemental oxygen. We have prescribed 4 additional days of decadron (steroid) for you to take starting tomorrow (10/25/19). You qualified for home oxygen (1 L) to use with exertion. You will need to quarantine for a total of 21 days from the start of your symptoms. You will be eligible to receive a COVID vaccine 90 days after you received your monoclonal antibody infusion. We recommend that you follow-up with your primary care physician with a telehealth appointment in the next week. Someone will reach out to you schedule that appointment.  Take care!   Face-to-face encounter (required for Medicare/Medicaid patients)   Complete by: As directed    I Alphonzo Severance certify that this patient is under my care and that I, or a nurse practitioner or physician's assistant working with me, had a face-to-face encounter that meets the physician face-to-face encounter requirements with this patient on 10/24/2019. The encounter with the patient was in whole, or in part for the following medical condition(s) which is the primary reason for home health care (List medical condition): COVID-19 pneumonia   The encounter with the patient was in whole, or in part, for the following medical  condition, which is the primary reason for home health care: covid-19   I certify that, based on my findings, the following services are medically necessary home health services: Physical therapy   Reason for Medically Necessary Home Health Services: Therapy- Home [facility] to Facilitate Safety   My clinical findings support the need for the above services: Shortness of breath with activity   Further, I certify that my clinical findings support that this patient is homebound due to: Shortness of Breath with activity   Home Health   Complete by: As directed    To provide the following care/treatments: OT   Increase activity slowly   Complete by: As directed       Signed: Dellia Cloud, MD 10/24/2019, 4:11 PM   Pager: [phone]

## 2019-10-24 NOTE — Progress Notes (Signed)
Physical Therapy Treatment Patient Details Name: Dawn Galvan MRN: [id number] DOB: January 09, 1976 Today's Date: 10/24/2019    History of Present Illness 44 y/o unvaccinated female who was sent from [facility] with progressive shortness of breath and cough. She tested positive for COVID-19 on 10/13/19 and got the monoclonal antibody infusion on 10/17/19. She noticed worsening shortness of breath and cough beginning 10/18/19. On evaluation in clinic she was 81%O2 on RA. Subsequently sent to ED for admission.     PT Comments    Focus of session, progressing mobility and education on energy conservation and safety given limited amount of assist at home and fact that PT will not be able to start until next week. Pt verbalizes understanding. Pt able to maintain SaO2 >90%O2 with ambulation of about 120 feet without AD. Educated pt that her HR is >120bpm with limited ambulation and that she will need to work on building her endurance in a slow and controlled manner. Given improvement in O2 demand and increased mobility PT continues to recommend HHPT even if it will be delayed. Pt hopeful for d/c home today.      Follow Up Recommendations  Home health PT;Supervision - Intermittent     Equipment Recommendations  3in1 (PT)       Precautions / Restrictions Precautions Precautions: None Precaution Comments: COVID+    Mobility  Bed Mobility               General bed mobility comments: up in recliner on entry   Transfers Overall transfer level: Modified independent Equipment used: None             General transfer comment: increased time and effort to come to standing, dizziness when she got up too quickly  Ambulation/Gait Ambulation/Gait assistance: Supervision Gait Distance (Feet): 120 Feet Assistive device: None Gait Pattern/deviations: Step-through pattern;Decreased stride length;Shuffle Gait velocity: slowed   General Gait Details: supervision for safety, slow, steady gait, no  overt LoB   Stairs             Wheelchair Mobility    Modified Rankin (Stroke Patients Only)       Balance Overall balance assessment: Mild deficits observed, not formally tested                                          Cognition Arousal/Alertness: Awake/alert Behavior During Therapy: WFL for tasks assessed/performed Overall Cognitive Status: Within Functional Limits for tasks assessed                                           General Comments General comments (skin integrity, edema, etc.): Pt on RA and maintained SaO2 >90%O2 with ambulation, max noted HR with ambulation 124 bpm      Pertinent Vitals/Pain Pain Assessment: No/denies pain           PT Goals (current goals can now be found in the care plan section) Acute Rehab PT Goals Patient Stated Goal: go home to take care of son  PT Goal Formulation: With patient Time For Goal Achievement: 11/06/19 Potential to Achieve Goals: Good Progress towards PT goals: Progressing toward goals    Frequency    Min 3X/week      PT Plan Current plan remains appropriate       AM-PAC  PT "6 Clicks" Mobility   Outcome Measure  Help needed turning from your back to your side while in a flat bed without using bedrails?: None Help needed moving from lying on your back to sitting on the side of a flat bed without using bedrails?: None Help needed moving to and from a bed to a chair (including a wheelchair)?: None Help needed standing up from a chair using your arms (e.g., wheelchair or bedside chair)?: None Help needed to walk in hospital room?: A Little Help needed climbing 3-5 steps with a railing? : A Little 6 Click Score: 22    End of Session Equipment Utilized During Treatment: Oxygen Activity Tolerance: Patient limited by fatigue Patient left: in chair;with call bell/phone within reach Nurse Communication: Mobility status PT Visit Diagnosis: Unsteadiness on feet  (R26.81);Muscle weakness (generalized) (M62.81);Difficulty in walking, not elsewhere classified (R26.2)     Time: 8343-7357 PT Time Calculation (min) (ACUTE ONLY): 18 min  Charges:  $Therapeutic Exercise: 8-22 mins                     Merari Pion B. Beverely Risen PT, DPT Acute Rehabilitation Services Pager [phone] Office [phone]    [facility] 10/24/2019, 4:24 PM

## 2019-10-25 ENCOUNTER — Telehealth: Payer: Self-pay

## 2019-10-25 NOTE — Telephone Encounter (Signed)
Hospital TOC per Dr. Claudette Laws, discharge 8/31, telehealth appt 10/31/2019.

## 2019-10-26 DIAGNOSIS — J9601 Acute respiratory failure with hypoxia: Secondary | ICD-10-CM | POA: Diagnosis not present

## 2019-10-26 DIAGNOSIS — U071 COVID-19: Secondary | ICD-10-CM | POA: Diagnosis not present

## 2019-10-26 DIAGNOSIS — J1282 Pneumonia due to coronavirus disease 2019: Secondary | ICD-10-CM | POA: Diagnosis not present

## 2019-10-26 NOTE — Telephone Encounter (Signed)
Transition Care Management Follow-up Telephone Call  Date of discharge and from where: 10/24/2019  How have you been since you were released from the hospital? "I just need to rest"  Any questions or concerns? No  Items Reviewed:  Did the pt receive and understand the discharge instructions provided? Yes   Medications obtained and verified? Patient ended call before med rec could be completed.  Any new allergies since your discharge? No   Dietary orders reviewed? Yes  Do you have support at home? Yes    RN unable to complete telephone call, patient states she does not have much time to talk because she has to pick up her son.  Patient states she is "waiting for [facility] to start".  RN instructed patient per her discharge instructions, HH Services to call within a week to schedule visit times.  Patient also states O2 company told her they could bring a small portable O2 machine out to her for upstairs use and she hasn't received this.  States she is staying downstairs.  RN informed patient contact number for O2 company is located on page 2 of her discharge instructions and encouraged patient to call regarding portable O2 and to arrange delivery time. Patient states she wants permission to do all ADL's on her own without help, but is not ready to do this yet.  TC disconnected.  RN attempted to call patient back, VM obtained, [facility] compliant message left to call nurse triage back if needed.  Also informed patient of telehealth f/u visit appt time.  SChaplin, RN,BSN

## 2019-10-29 ENCOUNTER — Encounter (HOSPITAL_COMMUNITY): Payer: Self-pay | Admitting: Emergency Medicine

## 2019-10-29 ENCOUNTER — Ambulatory Visit (HOSPITAL_COMMUNITY)
Admission: EM | Admit: 2019-10-29 | Discharge: 2019-10-29 | Disposition: A | Discharge status: Home / Self Care | Payer: Medicaid Other

## 2019-10-29 ENCOUNTER — Other Ambulatory Visit: Payer: Self-pay

## 2019-10-29 ENCOUNTER — Telehealth: Payer: Self-pay | Admitting: Student

## 2019-10-29 DIAGNOSIS — R102 Pelvic and perineal pain: Secondary | ICD-10-CM | POA: Diagnosis not present

## 2019-10-29 DIAGNOSIS — L0292 Furuncle, unspecified: Secondary | ICD-10-CM | POA: Diagnosis not present

## 2019-10-29 DIAGNOSIS — U071 COVID-19: Secondary | ICD-10-CM

## 2019-10-29 LAB — POCT URINALYSIS DIPSTICK, ED / UC
Glucose, UA: NEGATIVE mg/dL
Hgb urine dipstick: NEGATIVE
Leukocytes,Ua: NEGATIVE
Nitrite: NEGATIVE
Protein, ur: NEGATIVE mg/dL
Specific Gravity, Urine: >=1.03 (ref 1.005–1.030)
Urobilinogen, UA: 1 mg/dL (ref 0.0–1.0)
pH: 5.5 (ref 5.0–8.0)

## 2019-10-29 MED ORDER — DOXYCYCLINE HYCLATE 100 MG PO CAPS: 100.0000 mg | ORAL_CAPSULE | Freq: Two times a day (BID) | ORAL | 0 refills | Status: DC

## 2019-10-29 MED ORDER — HYDROCORTISONE 1 % EX CREA: TOPICAL_CREAM | CUTANEOUS | 0 refills | Status: DC

## 2019-10-29 MED ORDER — ACETAMINOPHEN ER 650 MG PO TBCR: 650.0000 mg | EXTENDED_RELEASE_TABLET | Freq: Three times a day (TID) | ORAL | 0 refills | Status: AC | PRN

## 2019-10-29 MED ORDER — FLUCONAZOLE 200 MG PO TABS: 200.0000 mg | ORAL_TABLET | Freq: Once | ORAL | 0 refills | Status: AC

## 2019-10-29 MED ORDER — IBUPROFEN 600 MG PO TABS: 600.0000 mg | ORAL_TABLET | Freq: Four times a day (QID) | ORAL | 0 refills | Status: DC | PRN

## 2019-10-29 NOTE — ED Provider Notes (Signed)
[place]    CSN: [id number] Arrival date & time: 10/29/19  1503      History   Chief Complaint Chief Complaint  Patient presents with  . covid positive  . perineum discomfort    HPI Dawn Galvan is a 44 y.o. female.   Reports that she is experiencing vaginal irritation since she was released from the hospital after being treated for Covid as an inpatient. Reports that she is also using supplemental O2 prn. She has not attempted OTC treatments for this. Has not experienced this type of pain before. Reports that pain is aggravated by sitting, urinating. Reports that the area is tender to touch. Denies dysuria, hematuria, abdominal pain, vaginal discharge, vaginal bleeding, other symptoms.   ROS per HPI  The history is provided by the patient.    Past Medical History:  Diagnosis Date  . Carpal tunnel syndrome    right  . 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 Family history of prostate cancer   . GERD (gastroesophageal reflux disease)   . Headache(784.0)    migraines or sinus  . History of vertigo    states comes and goes; no current med.  . Obesity   . Osteoarthritis   . PONV (postoperative nausea and vomiting)   . Rash 08/10/2013   right thigh  . Sleep apnea    does not wear cpap    Patient Active Problem List   Diagnosis Date Noted  . Acute hypoxemic respiratory failure ([place]) 10/19/2019  . Pneumonia due to COVID-19 virus 10/19/2019  . COVID-19 10/16/2019  . Nexplanon in place 05/29/2019  . At high risk for breast cancer 10/05/2018  . 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 Genetic testing 09/10/2017  . Family history of breast cancer   . Family history of prostate cancer   . Abnormal chest x-ray 03/24/2017  . Pharyngitis 07/14/2016  . Heel pain 06/25/2016  . Urinary frequency 02/28/2016  . S/P bariatric surgery  09/04/2015  . Depression 08/05/2015  . Muscle spasm of back 08/05/2015  . Carpal tunnel syndrome 08/05/2015  . Prediabetes 06/08/2014  . Morbid obesity ([place]) 06/12/2011    Past Surgical History:  Procedure Laterality Date  . CARPAL TUNNEL RELEASE Left 08/18/2013   Procedure: LEFT CARPAL TUNNEL RELEASE;  Surgeon: Tennis Must, MD;  Location: [facility];  Service: Orthopedics;  Laterality: Left;  . CARPAL TUNNEL RELEASE Right 04/29/2017   Procedure: RIGHT CARPAL TUNNEL RELEASE;  Surgeon: Leanora Cover, MD;  Location: [facility];  Service: Orthopedics;  Laterality: Right;  . CESAREAN SECTION    . CHOLECYSTECTOMY    . HERNIA REPAIR     x 2  . HYSTEROPLASTY REPAIR OF UTERINE ANOMALY    . HYSTEROSCOPY WITH D & C  01/13/2012   Procedure: DILATATION AND CURETTAGE /HYSTEROSCOPY;  Surgeon: Osborne Oman, MD;  Location: [facility];  Service: Gynecology;  Laterality: N/A;  Pap smear  . [place] RESECTION  2016  . TOE SURGERY Left    second toe. rod placed  . WISDOM TOOTH EXTRACTION  11/20/10    OB History    Gravida  2   Para  2   Term  2   Preterm  0   AB  0   Living  2     SAB  0   TAB  0   Ectopic  0   Multiple  0   Live Births               Home Medications    Prior to Admission medications   Medication Sig Start Date End Date Taking? Authorizing Provider  PHENTERMINE HCL PO Take by mouth.   Yes [provider]  topiramate (TOPAMAX) 25 MG tablet Take 25 mg by mouth 2 (two) times daily.   Yes [provider]  acetaminophen (TYLENOL 8 HOUR) 650 MG CR tablet Take 1 tablet (650 mg total) by mouth every 8 (eight) hours as needed for pain. 10/29/19   Dawn Congress, NP  benzonatate (TESSALON PERLES) 100 MG capsule Take 1 capsule (100 mg total) by mouth every 6 (six) hours as needed for cough. 10/16/19 10/15/20  Andrew Au, MD  dextromethorphan 15 MG/5ML syrup Take 10 mLs (30 mg total) by mouth 4 (four) times daily as needed for cough.  10/18/19   Andrew Au, MD  doxycycline (VIBRAMYCIN) 100 MG capsule Take 1 capsule (100 mg total) by mouth 2 (two) times daily. 10/29/19   Dawn Congress, NP  hydrocortisone cream 1 % Apply to affected area 2 times daily 10/29/19   Dawn Congress, NP  ibuprofen (ADVIL) 600 MG tablet Take 1 tablet (600 mg total) by mouth every 6 (six) hours as needed. 10/29/19   Dawn Congress, NP  ondansetron (ZOFRAN-ODT) 8 MG disintegrating tablet Take 8 mg by mouth 3 (three) times daily as needed for nausea or vomiting (DISSOLVE ORALLY).  10/13/19   [provider]    Family History Family History  Problem Relation Age of Onset  . Hypertension Mother   . Breast cancer Mother   . Hypertension Father   . Diabetes Maternal Grandmother   . Diabetes Paternal Grandmother     Social History Social History   Tobacco Use  . Smoking status: Former Smoker    Years: 0.00    Quit date: 02/22/2009    Years since quitting: 10.6  . Smokeless tobacco: Never Used  Vaping Use  . Vaping Use: Never used  Substance Use Topics  . Alcohol use: Yes    Comment: Social  . Drug use: No     Allergies   Other   Review of Systems Review of Systems   Physical Exam Triage Vital Signs ED Triage Vitals  Enc Vitals Group     BP 10/29/19 1705 139/85     Pulse Rate 10/29/19 1705 (!) 106     Resp 10/29/19 1705 (!) 22     Temp 10/29/19 1705 98.6 F (37 C)     Temp Source 10/29/19 1705 Oral     SpO2 10/29/19 1705 99 %     Weight --      Height --      Head Circumference --      Peak Flow --      Pain Score 10/29/19 1701 10     Pain Loc --      Pain Edu? --      Excl. in [place]? --    No data found.  Updated Vital Signs BP 139/85 (BP Location: Left Arm) Comment (BP Location): large cuff, forearm  Pulse (!) 106   Temp 98.6 F (37 C) (Oral)   Resp (!) 22   SpO2 99%   Visual Acuity Right Eye Distance:   Left Eye Distance:   Bilateral Distance:    Right Eye Near:   Left Eye  Near:      Bilateral Near:     Physical Exam Vitals and nursing note reviewed.  Constitutional:      General: She is not in acute distress.    Appearance: She is well-developed. She is obese.  HENT:     Head: Normocephalic and atraumatic.  Eyes:     Conjunctiva/sclera: Conjunctivae normal.  Cardiovascular:     Rate and Rhythm: Normal rate and regular rhythm.     Heart sounds: Normal heart sounds. No murmur heard.   Pulmonary:     Effort: Pulmonary effort is normal. No respiratory distress.     Breath sounds: Normal breath sounds.     Comments: On portable O2 via [place] Abdominal:     Palpations: Abdomen is soft.     Tenderness: There is no abdominal tenderness.  Genitourinary:   Musculoskeletal:     Cervical back: Neck supple.  Skin:    General: Skin is warm and dry.  Neurological:     Mental Status: She is alert.      UC Treatments / Results  Labs (all labs ordered are listed, but only abnormal results are displayed) Labs Reviewed  POCT URINALYSIS DIPSTICK, ED / UC - Abnormal; Notable for the following components:      Result Value   Bilirubin Urine SMALL (*)    Ketones, ur TRACE (*)    All other components within normal limits    EKG   Radiology No results found.  Procedures Procedures (including critical care time)  Medications Ordered in UC Medications - No data to display  Initial Impression / Assessment and Plan / UC Course  I have reviewed the triage vital signs and the nursing notes.  Pertinent labs & imaging results that were available during my care of the patient were reviewed by me and considered in my medical decision making (see chart for details).     Perineal Pain Boil  Presents with perineal pain and irritation after inpatient stay at the hospital where she had an indwelling catheter Upon exam, there is a boil to the upper left labia as indicated on diagram Does not appear to be a candidate for I&D Also noted inflammation to almost the entire  vulva on exam Clean the area well with mild soap and water Pat dry Prescribed doxycycline Prescribed ibuprofen Prescribed tylenol Prescribed hydrocortisone cream May use hydrocortisone to the area, external vulva only Prescribed fluconazole in case of yeast May use ice to the area to decrease swelling Follow up with primary care as needed if symptoms persist Return to the ER for trouble swallowing, increased pain, high fever, trouble breathing, other concerning symptoms Final Clinical Impressions(s) / UC Diagnoses   Final diagnoses:  Boil  Perineal pain     Discharge Instructions     I have sent in doxycycline for you to take twice a day for 7 days  I have also sent in Tylenol as well as ibuprofen for you to alternate as needed for pain  I have sent in hydrocortisone cream for you to apply to the area to help with inflammation from the outside as well  Follow-up with this office or with primary care if your symptoms are not improving over the course of your antibiotics  Follow-up with the ER for trouble swallowing, trouble breathing, other concerning symptoms.    ED Prescriptions    Medication Sig Dispense Auth. Provider   doxycycline (VIBRAMYCIN) 100 MG capsule Take 1 capsule (100 mg total) by mouth 2 (two)  times daily. 14 capsule Dawn Congress, NP   acetaminophen (TYLENOL 8 HOUR) 650 MG CR tablet Take 1 tablet (650 mg total) by mouth every 8 (eight) hours as needed for pain. 30 tablet Dawn Congress, NP   ibuprofen (ADVIL) 600 MG tablet Take 1 tablet (600 mg total) by mouth every 6 (six) hours as needed. 30 tablet Dawn Congress, NP   hydrocortisone cream 1 % Apply to affected area 2 times daily 15 g Dawn Congress, NP   fluconazole (DIFLUCAN) 200 MG tablet Take 1 tablet (200 mg total) by mouth once for 1 dose. 2 tablet Dawn Congress, NP     PDMP not reviewed this encounter.   Dawn Congress, NP 10/30/19 1050

## 2019-10-29 NOTE — Discharge Instructions (Signed)
I have sent in doxycycline for you to take twice a day for 7 days  I have also sent in Tylenol as well as ibuprofen for you to alternate as needed for pain  I have sent in hydrocortisone cream for you to apply to the area to help with inflammation from the outside as well  Follow-up with this office or with primary care if your symptoms are not improving over the course of your antibiotics  Follow-up with the ER for trouble swallowing, trouble breathing, other concerning symptoms.

## 2019-10-29 NOTE — Telephone Encounter (Signed)
Patient called stating that since she has been discharged from hospital and removal of her urinary catheter she has had significant pain making her unable to walk up stairs, wash herself, and otherwise care for herself. IN the last 3 days she notes worsening dysuria and swelling in the groin. She repeatedly states "It's painful to sit or walk. I can't move, I can't walk, I can't go upstairs. I cannot bathe myself due to the pain." Given the severity of her symptoms limiting her from caring for herself advised that she be evaluated. Pt states she is unable to transport her self due to pain, advised that she can call an ambulance given severity of symptoms. Upon directing to pt to seek further evaluation given the severity of the symptoms she is describing, she became agitated that she was not able to get assistance at her house rather and ended the call prior to finishing the conversation.

## 2019-10-29 NOTE — ED Triage Notes (Signed)
Patient hospitalized and discharged this past week.  Patient had a urinary catheter in place until discharge.  Patient states she is unable to bathe properly and labia is very tender and sore and mentioned a swollen area to labia

## 2019-10-31 ENCOUNTER — Ambulatory Visit (INDEPENDENT_AMBULATORY_CARE_PROVIDER_SITE_OTHER): Payer: Medicaid Other | Admitting: Internal Medicine

## 2019-10-31 ENCOUNTER — Encounter: Payer: Self-pay | Admitting: Internal Medicine

## 2019-10-31 ENCOUNTER — Other Ambulatory Visit: Payer: Self-pay

## 2019-10-31 DIAGNOSIS — J9601 Acute respiratory failure with hypoxia: Secondary | ICD-10-CM | POA: Diagnosis not present

## 2019-10-31 NOTE — Progress Notes (Signed)
  [facility] Internal Medicine Residency Telephone Encounter Continuity Care Appointment  HPI:   This telephone encounter was created for Dawn Galvan on 10/31/2019 for the following purpose/cc acute hypoxic respiratory failure from Covid -19 pneumonia . Patient was treated in outpatient setting before being hospitalized with monoclonal antibody infusion ( 8/24) She was hospitalized and received supplemental oxygen, 3 days of remdesivir, 6 days of Decadron, and 6 days of Baracititnib. She was sent home on 1 L of supplemental oxygen.  Since being home she feels tired and weak.  Therapy and physical therapy ordered, but due to scheduling have not been out to her home to date. She is very upset and frustrated with her recent care. We discussed this in detail and information was given to our clinic manager to follow up on. Patient was seen in ED on Sunday and reports the abscess burst. She only notes sanguinous drainage. She is taking doxycycline. She has noticed some dizziness and wondered if it could be from the oxygen. We discussed only using the 1L and she could wean off if breathing well without it.     Past Medical History:  Past Medical History:  Diagnosis Date  . Carpal tunnel syndrome    right  . 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 Family history of prostate cancer   . GERD (gastroesophageal reflux disease)   . Headache(784.0)    migraines or sinus  . History of vertigo    states comes and goes; no current med.  . Obesity   . Osteoarthritis   . PONV (postoperative nausea and vomiting)   . Rash 08/10/2013   right thigh  . Sleep apnea    does not wear cpap      ROS:  Review of Systems  Constitutional: Negative for chills and fever.  Respiratory: Negative for cough and sputum production.      Assessment / Plan / Recommendations:   Please see A&P under problem oriented charting for assessment of  the patient's acute and chronic medical conditions.   As always, pt is advised that if symptoms worsen or new symptoms arise, they should go to an urgent care facility or to to ER for further evaluation.   Consent and Medical Decision Making:   Patient discussed with Dr. Daryll Drown  This is a telephone encounter between [facility] and Dawn Galvan on 10/31/2019 for acute hypoxic respirator y . The visit was conducted with the patient located at home and Dawn Galvan at [facility]. The patient's identity was confirmed using their DOB and current address. The patient has consented to being evaluated through a telephone encounter and understands the associated risks (an examination cannot be done and the patient may need to come in for an appointment) / benefits (allows the patient to remain at home, decreasing exposure to coronavirus). I personally spent 21 minutes on medical discussion.

## 2019-10-31 NOTE — Assessment & Plan Note (Signed)
Patient was treated in outpatient setting before being hospitalized with monoclonal antibody infusion ( 8/24) She was hospitalized and received supplemental oxygen, 3 days of remdesivir, 6 days of Decadron, and 6 days of Baracititnib. She was sent home on 1 L of supplemental oxygen.  Since being home she feels tired and weak.  Therapy and physical therapy ordered, but due to scheduling have not been out to her home to date. She is very upset and frustrated with her recent care. We discussed this in detail and information was given to our clinic manager to follow up on. She has noticed some dizziness and wondered if it could be from the oxygen. We discussed only using the 1L and she could wean off if breathing well without it.    Plan: Called home health services and the PT will start with patient tomorrow. Receptionist will contact OT and also the patient to update her on these dates.  - Patient should follow up with PCP in 1-2 months to evaluate her recovery.

## 2019-11-01 ENCOUNTER — Telehealth: Payer: Self-pay | Admitting: Internal Medicine

## 2019-11-01 MED ORDER — CEPHALEXIN 500 MG PO CAPS: 500.0000 mg | ORAL_CAPSULE | Freq: Four times a day (QID) | ORAL | 0 refills | Status: AC

## 2019-11-01 NOTE — Telephone Encounter (Cosign Needed)
  [facility] Internal Medicine Residency Telephone Encounter  HPI:   This telephone encounter was created for Ms. Montzerrat Catrel Valvo on 11/01/2019 for the following purpose/cc nausea/vomiting. Ms. Berquist notes that she had telehealth appointment on 9/7 with Dr. Court Joy in which she expressed concerns with dizziness and attributed it to her oxygen supplementation. However, notes that since her telehealth visit, she took doxycycline that was prescribed for her perineal abscess which resulted in extreme nausea and vomiting; She believes that the doxycycline is contributing to her feeling unwell and would like to discontinue at this time. She does endorse ongoing perineal pain and noted some drainage from abscess since talking to Dr. Court Joy. Denies any perineal paresthesias, bowel or bladder incontinence, fevers/chills.    Past Medical History:  Past Medical History:  Diagnosis Date  . Carpal tunnel syndrome    right  . 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 Family history of prostate cancer   . GERD (gastroesophageal reflux disease)   . Headache(784.0)    migraines or sinus  . History of vertigo    states comes and goes; no current med.  . Obesity   . Osteoarthritis   . PONV (postoperative nausea and vomiting)   . Rash 08/10/2013   right thigh  . Sleep apnea    does not wear cpap      ROS:   Negative except as stated in HPI   Assessment / Plan / Recommendations:   Please see A&P under problem oriented charting for assessment of the patient's acute and chronic medical conditions.   As always, pt is advised that if symptoms worsen or new symptoms arise, they should go to an urgent care facility or to to ER for further evaluation.   Consent and Medical Decision Making:   Patient discussed with Dr. Dareen Piano  This is a telephone encounter between [place] and [place] on 11/01/2019 for nausea/vomiting in  setting of doxycycline use. Patient has been on doxycycline 180m bid for perineal abscess since 9/5. She endorses lightheadedness, nausea/vomiting and believes it is secondary to the doxycycline. She would like to discontinue at this time. Will switch to keflex at this time. Patient to continue Keflex [id number]/day for two days to complete five day course of antibiotics. The visit was conducted with the patient located at home and [facility]The patient's identity was confirmed using their DOB and current address. The patient has consented to being evaluated through a telephone encounter and understands the associated risks (an examination cannot be done and the patient may need to come in for an appointment) / benefits (allows the patient to remain at home, decreasing exposure to coronavirus). I personally spent 10 minutes on medical discussion.

## 2019-11-01 NOTE — Progress Notes (Signed)
Internal Medicine Clinic Attending  Case discussed with Dr. Steen  At the time of the visit.  We reviewed the resident's history and pertinent patient test results.  I agree with the assessment, diagnosis, and plan of care documented in the resident's note.  

## 2019-11-07 ENCOUNTER — Telehealth: Payer: Self-pay | Admitting: Internal Medicine

## 2019-11-07 NOTE — Telephone Encounter (Addendum)
Returned call to patient. States 1 day hx right foot burning/pain. Wondering if this is related to recent Covid illness. Also, with increase in anxiety/depression. First available appt given tomorrow with [facility] to discuss via telehealth. Kinnie Feil, BSN, RN-BC

## 2019-11-07 NOTE — Telephone Encounter (Signed)
Pt is requesting a nurse to callback [phone]

## 2019-11-07 NOTE — Telephone Encounter (Signed)
Thank you :)

## 2019-11-08 ENCOUNTER — Ambulatory Visit (INDEPENDENT_AMBULATORY_CARE_PROVIDER_SITE_OTHER): Payer: Medicaid Other | Admitting: Internal Medicine

## 2019-11-08 ENCOUNTER — Other Ambulatory Visit: Payer: Self-pay

## 2019-11-08 DIAGNOSIS — M79671 Pain in right foot: Secondary | ICD-10-CM | POA: Diagnosis not present

## 2019-11-08 DIAGNOSIS — F411 Generalized anxiety disorder: Secondary | ICD-10-CM | POA: Diagnosis not present

## 2019-11-09 ENCOUNTER — Other Ambulatory Visit: Payer: Self-pay

## 2019-11-09 ENCOUNTER — Encounter: Payer: Self-pay | Admitting: Internal Medicine

## 2019-11-09 ENCOUNTER — Ambulatory Visit: Payer: Medicaid Other | Admitting: Internal Medicine

## 2019-11-09 VITALS — BP 120/84 | HR 106 | Temp 98.7°F | Wt 283.5 lb

## 2019-11-09 DIAGNOSIS — J1282 Pneumonia due to coronavirus disease 2019: Secondary | ICD-10-CM

## 2019-11-09 DIAGNOSIS — M79671 Pain in right foot: Secondary | ICD-10-CM | POA: Diagnosis not present

## 2019-11-09 DIAGNOSIS — U071 COVID-19: Secondary | ICD-10-CM | POA: Diagnosis not present

## 2019-11-09 MED ORDER — SERTRALINE HCL 50 MG PO TABS: 50.0000 mg | ORAL_TABLET | Freq: Every day | ORAL | 2 refills | Status: DC

## 2019-11-09 NOTE — Patient Instructions (Signed)
To Dawn Galvan,  It was a pleasure meeting you! Today we discussed your foot pain. Due to your recent COVID infection and character of your pain we will get an ultrasound of your leg and foot to rule out a blood clot. If your pain begins to worsen or you notice skin changes, please go to the nearest urgent care or emergency department. Have a good day!  Sincerely,  Dolan Amen, MD

## 2019-11-09 NOTE — Progress Notes (Signed)
   CC: Foot Pain  HPI:  [name] is a 44 y.o. female, with a pmh noted below, presenting with foot pain. To see the management of her acute and chronic conditions, please see the a&p note under the encounters tab.   Past Medical History:  Diagnosis Date  . Carpal tunnel syndrome    right  . 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 Family history of prostate cancer   . GERD (gastroesophageal reflux disease)   . Headache(784.0)    migraines or sinus  . History of vertigo    states comes and goes; no current med.  . Obesity   . Osteoarthritis   . PONV (postoperative nausea and vomiting)   . Rash 08/10/2013   right thigh  . Sleep apnea    does not wear cpap   Review of Systems:   Review of Systems  Constitutional: Negative for chills, fever, malaise/fatigue and weight loss.  Respiratory: Negative for shortness of breath.   Cardiovascular: Negative for chest pain.  Gastrointestinal: Negative for abdominal pain, constipation, diarrhea, nausea and vomiting.  Musculoskeletal:       Foot pain  Neurological: Negative for dizziness, tingling, tremors, sensory change and headaches.    Physical Exam:  Vitals:   11/09/19 1009  BP: 120/84  Pulse: (!) 106  Temp: 98.7 F (37.1 C)  TempSrc: Oral  SpO2: 100%  Weight: 283 lb 8 oz (128.6 kg)   Physical Exam Vitals and nursing note reviewed.  Constitutional:      General: She is not in acute distress.    Appearance: Normal appearance. She is not ill-appearing or toxic-appearing.  HENT:     Head: Normocephalic and atraumatic.  Eyes:     General:        Right eye: No discharge.        Left eye: No discharge.     Conjunctiva/sclera: Conjunctivae normal.  Cardiovascular:     Rate and Rhythm: Normal rate and regular rhythm.     Pulses: Normal pulses.     Heart sounds: Normal heart sounds. No murmur heard.  No friction rub. No gallop.   Pulmonary:       Effort: Pulmonary effort is normal.     Breath sounds: Normal breath sounds. No wheezing, rhonchi or rales.  Abdominal:     General: Bowel sounds are normal.     Palpations: Abdomen is soft.     Tenderness: There is no abdominal tenderness. There is no guarding.  Musculoskeletal:     Right lower leg: No edema.     Left lower leg: No edema.     Comments: R. Foot appears to have no lesions, rashes, erythema, or skin changes over the dorsal and ventral aspects of the foot. Pain elicited along the calcaneus, arch, and dorsum of the foot. Pain elicited when patient placed foot down from examination table. Pulses palpable in the lower extremities bilaterally.   Neurological:     General: No focal deficit present.     Mental Status: She is alert and oriented to person, place, and time.  Psychiatric:        Mood and Affect: Mood normal.        Behavior: Behavior normal.     Assessment & Plan:   See Encounters Tab for problem based charting.  Patient discussed with Dr. Daryll Drown

## 2019-11-09 NOTE — Assessment & Plan Note (Addendum)
Patient with Hx of obesity, achilles tendonitis, right hip pain, and recent COVID infection presents to the clinic for acute R foot pain over the last two days. Patient states that the pain feels like "cramps" on the top and bottom of her foot. She states that her pain is exacerbated by bring her foot down from elevation. Additionally she states that the cramping is aggravated when driving at slower speed limits because her foot is more elevated than when she drives at 50-70 mph.   On physical examination, the effected limb has now skin changes. Neurologically intact with pain elicited on palpation to the arch, calcaneus, and dorsal aspect of the foot. Due to bodiy habitus, difficult to palpation. Both legs are symmetrical. No erythema noted. 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 While plantar fasciitis is on the differential, it typically does not present with pain to the dorsum of the foot, and the patients pain appears to be gravity dependent. Given that the patient has a recent COVID infection requiring hospitalization, will initially r/o an embolic cause.  - DVT RLE Study  - If negative consider ABI and foot xray at follow up - Patient given strict return precautions.

## 2019-11-10 ENCOUNTER — Ambulatory Visit (HOSPITAL_COMMUNITY)
Admission: RE | Admit: 2019-11-10 | Discharge: 2019-11-10 | Disposition: A | Discharge status: Home / Self Care | Payer: Medicaid Other | Source: Ambulatory Visit | Attending: Internal Medicine | Admitting: Internal Medicine

## 2019-11-10 ENCOUNTER — Telehealth: Payer: Self-pay

## 2019-11-10 DIAGNOSIS — M79671 Pain in right foot: Secondary | ICD-10-CM

## 2019-11-10 DIAGNOSIS — F411 Generalized anxiety disorder: Secondary | ICD-10-CM | POA: Insufficient documentation

## 2019-11-10 MED ORDER — DICLOFENAC SODIUM 1 % EX GEL: 2.0000 g | Freq: Four times a day (QID) | CUTANEOUS | 3 refills | Status: DC

## 2019-11-10 NOTE — Assessment & Plan Note (Signed)
This was a telephone encounter today for evaluation of right foot pain.  Given her recent hospitalization in addition to recent COVID 19 infection, I would consider her high risk for VTE.   Plan: discussed this with her and recommended in-person evaluation with a provider at our clinic in the next 1-2d. Will likely need a LE duplex at that time to r/o VTE.

## 2019-11-10 NOTE — Progress Notes (Signed)
[facility] Internal Medicine Residency Telephone Encounter Continuity Care Appointment  CC: right foot pain, anxiety   HPI:  This telephone encounter was created for [name] on 11/10/2019 for evaluation of right foot pain.  Chart review indicates this is a 53 yof who was admitted to [facility] 8/26-8/31/21 for COVID-19 pneumonia.   She notes that the right foot pain began about 2 days ago. It has since progressed into her ankle and lower leg. She notes the pain is fairly constant but worse with dorsiflexion. She denies any trama to the ankle. She thinks it may be a little swollen. She denies any discoloration. No prior history of VTE.  We also had a fairly lengthy discussion regarding anxiety. On chart review, it appears that she had some recent telephone encounters that have been challenging. She relays anxiousness about her health and is wondering if this is normal. She sounded like she was crying during our discussion.  She notes that she has a history of anxiety but was manageable until recently. She was previously on zoloft however she stopped taking it about 2 months ago. She denies suicidal plans.    Past Medical History:  Past Medical History:  Diagnosis Date  . Carpal tunnel syndrome    right  . 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 Family history of prostate cancer   . GERD (gastroesophageal reflux disease)   . Headache(784.0)    migraines or sinus  . History of vertigo    states comes and goes; no current med.  . Obesity   . Osteoarthritis   . PONV (postoperative nausea and vomiting)   . Rash 08/10/2013   right thigh  . Sleep apnea    does not wear cpap      ROS:  Review of Systems  Constitutional: Negative for chills and fever.  Respiratory: Positive for shortness of breath. Negative for hemoptysis.   Cardiovascular: Negative for chest pain.  Musculoskeletal: Positive for  joint pain.  Psychiatric/Behavioral: The patient is nervous/anxious.       Assessment / Plan / Recommendations:  1. Right foot pain This was a telephone encounter today for evaluation of right foot pain.  Given her recent hospitalization in addition to recent COVID 19 infection, I would consider her high risk for VTE.   Plan: discussed this with her and recommended in-person evaluation with a provider at our clinic in the next 1-2d. Will likely need a LE duplex at that time to r/o VTE.  2. Generalized anxiety disorder This was a telephone encounter. Patient expressed exacerbation from her baseline anxiety. She was previously on zoloft until about 2 months ago.  I think this is most likely a chronic underlying generalized anxiety disorder that is being exacerbated by her recent health issues.  Plan: patient notes that she still has the zoloft at home. She was on 41m so I instructed her to resume taking it and to follow up with uKoreain 4w so we can check in with her to see how she is doing and to see if we need to up her dose.   As always, pt is advised that if symptoms worsen or new symptoms arise, they should go to an urgent care facility or to to ER for further evaluation.   Consent and Medical Decision Making:   Patient discussed with Dr. RRebeca Alert This [facility] and Dawn Galvan on 11/10/2019 for right foot pain. The visit was conducted with the patient located at home and [employer] at [facility]. The patient's identity was confirmed using their DOB and current address. The patient has consented to being evaluated through a telephone encounter and understands the associated risks (an examination cannot be done and the patient may need to come in for an appointment) / benefits (allows the patient to remain at home, decreasing exposure to coronavirus). I personally spent 20 minutes on medical discussion.    Mitzi Hansen, MD Internal Medicine  Resident PGY-2 [facility] Residency Pager: [phone] 11/10/2019 8:02 AM

## 2019-11-10 NOTE — Telephone Encounter (Signed)
TC from patient, States she saw Dr. Sande Brothers yesterday and was calling about [place] results from today.  Would like to know what she can take for her foot pain.  Please call. Thank you, SChaplin, RN,BSN

## 2019-11-10 NOTE — Assessment & Plan Note (Signed)
This was a telephone encounter. Patient expressed exacerbation from her baseline anxiety. She was previously on zoloft until about 2 months ago.  I think this is most likely a chronic underlying generalized anxiety disorder that is being exacerbated by her recent health issues.  Plan: patient notes that she still has the zoloft at home. She was on 50mg  so I instructed her to resume taking it and to follow up with in 4w so we can check in with her to see how she is doing and to see if we need to up her dose.

## 2019-11-10 NOTE — Progress Notes (Signed)
Lower extremity venous has been completed.   Preliminary results in CV Proc.   Blanch Media 11/10/2019 10:16 AM

## 2019-11-15 NOTE — Progress Notes (Signed)
Internal Medicine Clinic Attending  Case discussed with Dr. Christian at the time of the visit.  We reviewed the resident's history and exam and pertinent patient test results.  I agree with the assessment, diagnosis, and plan of care documented in the resident's note.  Kairyn Olmeda, M.D., Ph.D.  

## 2019-11-15 NOTE — Progress Notes (Signed)
Internal Medicine Clinic Attending ? ?Case discussed with Dr. Winters  At the time of the visit.  We reviewed the resident?s history and exam and pertinent patient test results.  I agree with the assessment, diagnosis, and plan of care documented in the resident?s note.  ?

## 2019-11-21 ENCOUNTER — Encounter: Payer: Self-pay | Admitting: Internal Medicine

## 2019-11-24 ENCOUNTER — Encounter: Payer: Self-pay | Admitting: Pulmonary Disease

## 2019-11-24 ENCOUNTER — Other Ambulatory Visit: Payer: Self-pay

## 2019-11-24 ENCOUNTER — Ambulatory Visit (INDEPENDENT_AMBULATORY_CARE_PROVIDER_SITE_OTHER): Payer: Medicaid Other | Admitting: Pulmonary Disease

## 2019-11-24 DIAGNOSIS — J9601 Acute respiratory failure with hypoxia: Secondary | ICD-10-CM | POA: Diagnosis not present

## 2019-11-24 DIAGNOSIS — U071 COVID-19: Secondary | ICD-10-CM | POA: Diagnosis not present

## 2019-11-24 DIAGNOSIS — J1282 Pneumonia due to coronavirus disease 2019: Secondary | ICD-10-CM | POA: Diagnosis not present

## 2019-11-24 DIAGNOSIS — R531 Weakness: Secondary | ICD-10-CM | POA: Diagnosis not present

## 2019-11-24 DIAGNOSIS — R0602 Shortness of breath: Secondary | ICD-10-CM | POA: Diagnosis not present

## 2019-11-24 NOTE — Progress Notes (Signed)
Dawn Galvan    [id number]    08-23-1975  Primary Care Physician:[place], Nicholes Calamity, DO  Referring Physician: Modena Nunnery D, DO [address],  [place] [address]  Chief complaint:  Follow up for Post COVID 4  HPI: 44 year old with allergies, sleep apnea [not on CPAP], GERD Received monoclonal antibody on 8/14 for COVID-19 subsequently got hospitalized and received remdesivir, Decadron and baricitinib.  Discharged on supplemental oxygen which she weaned himself off Continues to have significant dyspnea on exertion and self-referred for pulmonary evaluation  Has cough with congestion, postnasal drip.  Denies any fevers, chills  Pets: Dog Occupation: Restaurant manager, fast food  Exposures: No known exposures.  No mold, hot tub, Jacuzzi.  No feather pillows or comforters Smoking history: Smoked as a teenager Travel history: No significant travel history Relevant family history: No significant family history of lung disease  Outpatient Encounter Medications as of 11/24/2019  Medication Sig  . acetaminophen (TYLENOL 8 HOUR) 650 MG CR tablet Take 1 tablet (650 mg total) by mouth every 8 (eight) hours as needed for pain.  Marland Kitchen diclofenac Sodium (VOLTAREN) 1 % GEL Apply 2 g topically 4 (four) times daily.  . hydrocortisone cream 1 % Apply to affected area 2 times daily  . ibuprofen (ADVIL) 600 MG tablet Take 1 tablet (600 mg total) by mouth every 6 (six) hours as needed.  . ondansetron (ZOFRAN-ODT) 8 MG disintegrating tablet Take 8 mg by mouth 3 (three) times daily as needed for nausea or vomiting (DISSOLVE ORALLY).   Marland Kitchen PHENTERMINE HCL PO Take by mouth.  . sertraline (ZOLOFT) 50 MG tablet Take 1 tablet (50 mg total) by mouth daily.  Marland Kitchen topiramate (TOPAMAX) 25 MG tablet Take 25 mg by mouth 2 (two) times daily.  . [DISCONTINUED] benzonatate (TESSALON PERLES) 100 MG capsule Take 1 capsule (100 mg total) by mouth every 6 (six) hours as needed for cough.  .  [DISCONTINUED] dextromethorphan 15 MG/5ML syrup Take 10 mLs (30 mg total) by mouth 4 (four) times daily as needed for cough.  . [DISCONTINUED] doxycycline (VIBRAMYCIN) 100 MG capsule Take 1 capsule (100 mg total) by mouth 2 (two) times daily.   No facility-administered encounter medications on file as of 11/24/2019.    Allergies as of 11/24/2019 - Review Complete 11/24/2019  Allergen Reaction Noted  . Doxycycline Nausea And Vomiting 11/09/2019  . Other Itching and Other (See Comments) 10/19/2019    Past Medical History:  Diagnosis Date  . Carpal tunnel syndrome    right  . 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 Family history of prostate cancer   . GERD (gastroesophageal reflux disease)   . Headache(784.0)    migraines or sinus  . History of vertigo    states comes and goes; no current med.  . Obesity   . Osteoarthritis   . PONV (postoperative nausea and vomiting)   . Rash 08/10/2013   right thigh  . Sleep apnea    does not wear cpap    Past Surgical History:  Procedure Laterality Date  . CARPAL TUNNEL RELEASE Left 08/18/2013   Procedure: LEFT CARPAL TUNNEL RELEASE;  Surgeon: Tennis Must, MD;  Location: [facility];  Service: Orthopedics;  Laterality: Left;  . CARPAL TUNNEL RELEASE Right 04/29/2017   Procedure: RIGHT CARPAL TUNNEL RELEASE;  Surgeon: Leanora Cover, MD;  Location: [facility];  Service:  Orthopedics;  Laterality: Right;  . CESAREAN SECTION    . CHOLECYSTECTOMY    . HERNIA REPAIR     x 2  . HYSTEROPLASTY REPAIR OF UTERINE ANOMALY    . HYSTEROSCOPY WITH D & C  01/13/2012   Procedure: DILATATION AND CURETTAGE /HYSTEROSCOPY;  Surgeon: Osborne Oman, MD;  Location: [facility];  Service: Gynecology;  Laterality: N/A;  Pap smear  . [place] RESECTION  2016  . TOE SURGERY Left    second toe. rod placed  . WISDOM TOOTH EXTRACTION  11/20/10    Family History  Problem Relation Age of Onset   . Hypertension Mother   . Breast cancer Mother   . Hypertension Father   . Diabetes Maternal Grandmother   . Diabetes Paternal Grandmother     Social History   Socioeconomic History  . Marital status: Single    Spouse name: Not on file  . Number of children: Not on file  . Years of education: Not on file  . Highest education level: Not on file  Occupational History  . Not on file  Tobacco Use  . Smoking status: Former Smoker    Years: 0.00    Quit date: 02/22/2009    Years since quitting: 10.7  . Smokeless tobacco: Never Used  Vaping Use  . Vaping Use: Never used  Substance and Sexual Activity  . Alcohol use: Yes    Comment: Social  . Drug use: No  . Sexual activity: Not Currently    Birth control/protection: Implant    Comment: never \smoke routinue bases  Other Topics Concern  . Not on file  Social History Narrative  . Not on file   Social Determinants of Health   Financial Resource Strain:   . Difficulty of Paying Living Expenses: Not on file  Food Insecurity:   . Worried About Charity fundraiser in the Last Year: Not on file  . Ran Out of Food in the Last Year: Not on file  Transportation Needs:   . Lack of Transportation (Medical): Not on file  . Lack of Transportation (Non-Medical): Not on file  Physical Activity:   . Days of Exercise per Week: Not on file  . Minutes of Exercise per Session: Not on file  Stress:   . Feeling of Stress : Not on file  Social Connections:   . Frequency of Communication with Friends and Family: Not on file  . Frequency of Social Gatherings with Friends and Family: Not on file  . Attends Religious Services: Not on file  . Active Member of Clubs or Organizations: Not on file  . Attends Archivist Meetings: Not on file  . Marital Status: Not on file  Intimate Partner Violence:   . Fear of Current or Ex-Partner: Not on file  . Emotionally Abused: Not on file  . Physically Abused: Not on file  . Sexually Abused:  Not on file    Review of systems: Review of Systems  Constitutional: Negative for fever and chills.  HENT: Negative.   Eyes: Negative for blurred vision.  Respiratory: as per HPI  Cardiovascular: Negative for chest pain and palpitations.  Gastrointestinal: Negative for vomiting, diarrhea, blood per rectum. Genitourinary: Negative for dysuria, urgency, frequency and hematuria.  Musculoskeletal: Negative for myalgias, back pain and joint pain.  Skin: Negative for itching and rash.  Neurological: Negative for dizziness, tremors, focal weakness, seizures and loss of consciousness.  Endo/Heme/Allergies: Negative for environmental allergies.  Psychiatric/Behavioral: Negative for  depression, suicidal ideas and hallucinations.  All other systems reviewed and are negative.  Physical Exam: Blood pressure 136/74, pulse 91, temperature 97.7 F (36.5 C), temperature source Skin, height _0  (1.626 m), weight 285 lb 6.4 oz (129.5 kg), SpO2 99 %. Gen:      No acute distress HEENT:  EOMI, sclera anicteric Neck:     No masses; no thyromegaly Lungs:    Clear to auscultation bilaterally; normal respiratory effort CV:         Regular rate and rhythm; no murmurs Abd:      + bowel sounds; soft, non-tender; no palpable masses, no distension Ext:    No edema; adequate peripheral perfusion Skin:      Warm and dry; no rash Neuro: alert and oriented x 3 Psych: normal mood and affect  Data Reviewed: Imaging: Chest x-ray 10/19/2019-multifocal pneumonia with basal airspace opacities.  Nodular appearing opacity in the left midlung.  I have reviewed the images personally.  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  Plan/Recommendations: High-res CT, PFTs  Marshell Garfinkel MD [facility] 11/24/2019, 12:25 PM  CC: Modena Nunnery D, DO

## 2019-11-24 NOTE — Patient Instructions (Signed)
We will get a high-resolution CT and PFTs for evaluation of your lungs Follow-up in 1 to 2 months

## 2019-12-21 ENCOUNTER — Encounter: Payer: Self-pay | Admitting: Oncology

## 2019-12-21 ENCOUNTER — Telehealth: Payer: Self-pay | Admitting: Pulmonary Disease

## 2019-12-21 NOTE — Telephone Encounter (Signed)
Spoke with Tammy, RN  She received msg from the pt asking for something to take for anxiety prior to her ct scan the we ordered  She is claustrophobic  She has used Ativan 1 mg in the past for pre procedure  Her scan is Monday 12/25/19  Please advise, thanks!

## 2019-12-21 NOTE — Telephone Encounter (Signed)
Scan is tomorrow.Caren Griffins

## 2019-12-22 MED ORDER — LORAZEPAM 1 MG PO TABS: 1.0000 mg | ORAL_TABLET | ORAL | 0 refills | Status: DC | PRN

## 2019-12-22 NOTE — Telephone Encounter (Signed)
Noted. Will close this encounter.  

## 2019-12-22 NOTE — Telephone Encounter (Signed)
Okay to order Ativan 1 mg PO once before CT scan.

## 2019-12-22 NOTE — Telephone Encounter (Signed)
ATC patient unable to leave message due to VM being full. RX sent into pharmacy.  Dr. Isaiah Serge please sign order pended below

## 2019-12-22 NOTE — Telephone Encounter (Signed)
Ok. Done 

## 2019-12-25 ENCOUNTER — Ambulatory Visit
Admission: RE | Admit: 2019-12-25 | Discharge: 2019-12-25 | Disposition: A | Discharge status: Home / Self Care | Payer: Medicaid Other | Source: Ambulatory Visit | Attending: Internal Medicine | Admitting: Internal Medicine

## 2019-12-25 ENCOUNTER — Ambulatory Visit
Admission: RE | Admit: 2019-12-25 | Discharge: 2019-12-25 | Disposition: A | Discharge status: Home / Self Care | Payer: Medicaid Other | Source: Ambulatory Visit | Attending: Pulmonary Disease | Admitting: Pulmonary Disease

## 2019-12-25 ENCOUNTER — Telehealth: Payer: Self-pay | Admitting: Pulmonary Disease

## 2019-12-25 DIAGNOSIS — R0602 Shortness of breath: Secondary | ICD-10-CM

## 2019-12-25 DIAGNOSIS — R531 Weakness: Secondary | ICD-10-CM | POA: Diagnosis not present

## 2019-12-25 DIAGNOSIS — J1282 Pneumonia due to coronavirus disease 2019: Secondary | ICD-10-CM | POA: Diagnosis not present

## 2019-12-25 DIAGNOSIS — J84112 Idiopathic pulmonary fibrosis: Secondary | ICD-10-CM | POA: Diagnosis not present

## 2019-12-25 DIAGNOSIS — Z8701 Personal history of pneumonia (recurrent): Secondary | ICD-10-CM | POA: Diagnosis not present

## 2019-12-25 DIAGNOSIS — I700 Atherosclerosis of aorta: Secondary | ICD-10-CM | POA: Diagnosis not present

## 2019-12-25 DIAGNOSIS — M79671 Pain in right foot: Secondary | ICD-10-CM

## 2019-12-25 DIAGNOSIS — U071 COVID-19: Secondary | ICD-10-CM | POA: Diagnosis not present

## 2019-12-25 DIAGNOSIS — J9601 Acute respiratory failure with hypoxia: Secondary | ICD-10-CM | POA: Diagnosis not present

## 2019-12-25 DIAGNOSIS — J9811 Atelectasis: Secondary | ICD-10-CM | POA: Diagnosis not present

## 2019-12-25 DIAGNOSIS — M7731 Calcaneal spur, right foot: Secondary | ICD-10-CM | POA: Diagnosis not present

## 2019-12-25 NOTE — Progress Notes (Deleted)
Patient tested postitive for COVID on 10/19/19 no retest needed for 90 days, attempted to contact patient, unable to leave message because mailbox is full

## 2019-12-25 NOTE — Telephone Encounter (Signed)
Patient is returning phone call. Patient phone number is [phone]. May leave detailed message on voicemail.Dawn Galvan

## 2019-12-25 NOTE — Telephone Encounter (Signed)
Lm for patient.  

## 2019-12-25 NOTE — Telephone Encounter (Signed)
Called and spoke to patient, who is requesting CT results from 12/25/2019.  Dr. Isaiah Serge, please advise. Thanks

## 2019-12-26 ENCOUNTER — Encounter: Payer: Self-pay | Admitting: Internal Medicine

## 2019-12-26 ENCOUNTER — Other Ambulatory Visit (HOSPITAL_COMMUNITY): Payer: Medicaid Other

## 2019-12-26 NOTE — Telephone Encounter (Signed)
CT shows very minimal inflammation suggestive of resolving COVID-19 pneumonia.  There are no worrisome findings on the scan.  Will review in person at return clinic visit later this week.

## 2019-12-26 NOTE — Telephone Encounter (Signed)
Spoke with pt. She is aware of her CT results. Nothing further was needed. 

## 2019-12-26 NOTE — Telephone Encounter (Signed)
Patient was called at 4:15 PM on 12/26/2019 to discuss right foot radiograph findings. Patient was informed there is no acute fracture or dislocation. Discuss calcaneal spur findings. Patient continues to have intermittent right foot pain. Expressed understanding. Patient has a follow-up appointment with the [facility] center tomorrow, 12/27/2019 at 1:15 PM.

## 2019-12-27 ENCOUNTER — Other Ambulatory Visit: Payer: Self-pay

## 2019-12-27 ENCOUNTER — Ambulatory Visit: Payer: Medicaid Other | Admitting: Student

## 2019-12-27 ENCOUNTER — Encounter: Payer: Self-pay | Admitting: Internal Medicine

## 2019-12-27 ENCOUNTER — Encounter: Payer: Self-pay | Admitting: Student

## 2019-12-27 VITALS — BP 121/92 | HR 97 | Temp 98.0°F | Ht 64.0 in | Wt 285.3 lb

## 2019-12-27 DIAGNOSIS — R350 Frequency of micturition: Secondary | ICD-10-CM | POA: Diagnosis not present

## 2019-12-27 DIAGNOSIS — I700 Atherosclerosis of aorta: Secondary | ICD-10-CM | POA: Diagnosis not present

## 2019-12-27 DIAGNOSIS — R7303 Prediabetes: Secondary | ICD-10-CM

## 2019-12-27 DIAGNOSIS — U099 Post covid-19 condition, unspecified: Secondary | ICD-10-CM

## 2019-12-27 DIAGNOSIS — E785 Hyperlipidemia, unspecified: Secondary | ICD-10-CM | POA: Insufficient documentation

## 2019-12-27 DIAGNOSIS — E782 Mixed hyperlipidemia: Secondary | ICD-10-CM

## 2019-12-27 DIAGNOSIS — R918 Other nonspecific abnormal finding of lung field: Secondary | ICD-10-CM | POA: Insufficient documentation

## 2019-12-27 DIAGNOSIS — R0609 Other forms of dyspnea: Secondary | ICD-10-CM

## 2019-12-27 DIAGNOSIS — R911 Solitary pulmonary nodule: Secondary | ICD-10-CM

## 2019-12-27 DIAGNOSIS — R822 Biliuria: Secondary | ICD-10-CM | POA: Diagnosis not present

## 2019-12-27 LAB — LIPID PANEL
Cholesterol: 223 mg/dL — ABNORMAL HIGH (ref 0–200)
HDL: 55 mg/dL (ref >40)
LDL Cholesterol: 138 mg/dL — ABNORMAL HIGH (ref 0–99)
Total CHOL/HDL Ratio: 4.1 RATIO
Triglycerides: 148 mg/dL (ref <150)
VLDL: 30 mg/dL (ref 0–40)

## 2019-12-27 LAB — COMPREHENSIVE METABOLIC PANEL
ALT: 15 U/L (ref 0–44)
AST: 21 U/L (ref 15–41)
Albumin: 4.1 g/dL (ref 3.5–5.0)
Alkaline Phosphatase: 68 U/L (ref 38–126)
Anion gap: 15 (ref 5–15)
BUN: 9 mg/dL (ref 6–20)
CO2: 18 mmol/L — ABNORMAL LOW (ref 22–32)
Calcium: 10 mg/dL (ref 8.9–10.3)
Chloride: 109 mmol/L (ref 98–111)
Creatinine, Ser: 0.89 mg/dL (ref 0.44–1.00)
GFR, Estimated: >60 mL/min (ref >60)
Glucose, Bld: 89 mg/dL (ref 70–99)
Potassium: 4.2 mmol/L (ref 3.5–5.1)
Sodium: 142 mmol/L (ref 135–145)
Total Bilirubin: 0.7 mg/dL (ref 0.3–1.2)
Total Protein: 7.4 g/dL (ref 6.5–8.1)

## 2019-12-27 LAB — POCT URINALYSIS DIPSTICK
Bilirubin, UA: NEGATIVE
Blood, UA: NEGATIVE
Glucose, UA: NEGATIVE
Leukocytes, UA: NEGATIVE
Nitrite, UA: NEGATIVE
Protein, UA: NEGATIVE
Spec Grav, UA: >=1.03 — AB (ref 1.010–1.025)
Urobilinogen, UA: 2 E.U./dL — AB
pH, UA: 6 (ref 5.0–8.0)

## 2019-12-27 NOTE — Progress Notes (Addendum)
   CC: Post Covid conditions, aortic atherosclerosis  HPI:  [name] is a 44 y.o. female with history as below presenting for continued SOB after COVID infection in August. Please refer to problem based charting for further details of assessment and plan of current problem and chronic medical conditions.  Past Medical History:  Diagnosis Date  . Carpal tunnel syndrome    right  . 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 Family history of prostate cancer   . GERD (gastroesophageal reflux disease)   . Headache(784.0)    migraines or sinus  . History of vertigo    states comes and goes; no current med.  . Obesity   . Osteoarthritis   . PONV (postoperative nausea and vomiting)   . Rash 08/10/2013   right thigh  . Sleep apnea    does not wear cpap   Review of Systems:   Review of Systems  Constitutional: Negative for chills and fever.  Eyes: Negative for blurred vision and double vision.  Respiratory: Positive for cough, sputum production and shortness of breath.   Cardiovascular: Negative for chest pain, palpitations and orthopnea.  Gastrointestinal: Negative for abdominal pain, diarrhea, nausea and vomiting.  Genitourinary: Positive for frequency. Negative for dysuria.  Skin: Negative for rash.  Neurological: Negative for loss of consciousness.  All other systems reviewed and are negative.    Physical Exam: Vitals:   12/27/19 1431  BP: (!) 121/92  Pulse: 97  Temp: 98 F (36.7 C)  TempSrc: Oral  SpO2: 100%  Weight: 285 lb 4.8 oz (129.4 kg)  Height: $Remove'5\' 4"'KhaBmjK$  (1.626 m)   Constitutional: no acute distress Head: atraumatic ENT: external ears normal Cardiovascular: regular rate and rhythm, normal heart sounds Pulmonary: effort normal, normal breath sounds bilaterally Abdominal: flat, nontender, no rebound tenderness, bowel sounds normal Skin: warm and dry Neurological: alert, no focal  deficit Psychiatric: Nervous  Assessment & Plan:   See Encounters Tab for problem based charting.  Patient seen with Dr. Heber Ganado

## 2019-12-27 NOTE — Assessment & Plan Note (Addendum)
Endorses continued urinary frequency.  Recent UA with trace ketones and small urine bili.  CT scan suddenly found small renal stone  -check UA -check CMP  Addendum: formal UA without urobilinogen. CMP with benign AST and ALT. Small ketones still present with unclear etiology, possibly mild starvation ketosis with her phentermine use. This has been persistently present on prior UA

## 2019-12-27 NOTE — Patient Instructions (Signed)
Thank you for allowing [place] to be a part of your care today, it was a pleasure seeing you. We discussed your post-COVID symptoms and your aortic atherosclerosis.  I am checking these labs: cholesterol panel, hemoglobin A1c  I have made these changes to your medications: None for now, pending lab results  I will call with your lab results when they are back. Depending on what they show, I may start you on a cholesterol medication to reduce your work for a cardiac event.  It seems that your respiratory symptoms are improving, and your CT scan is reassuring. It is unfortunately taking many people a long time to recover completely, but I believe you will keep improving.   Thank you, and please call the Internal Medicine Clinic at [phone] if you have any questions.  Best, Dr. Imogene Burn

## 2019-12-27 NOTE — Assessment & Plan Note (Addendum)
Noted incidentally on 11-1/21 CT scan of chest.  No history of cardiac disease, or hypertension.  Is prediabetic.  Last A1c in 2018 was 5.8.  -lipid panel -A1c was not collected, but will check blood sugar on CMP collected for other reasons  Addendum: -lipid panel with LDL 138. 10-year ASCVD risk of 0.7%. however will start on rosuvastatin 10 given known atherosclerosis and LDL level -blood sugar of 89 on CMP -discussed diet and lifestyle modifications

## 2019-12-27 NOTE — Assessment & Plan Note (Signed)
Patient had COVID-19 pnuemonia for which she received mAb then was hospitalized and received supplemental oxygen, 3 days of remdesivir, 6 days of Decadron, and 6 days of Baracititnib, then discharged on 8/31 with supplemental 1L O2 at home which she weaned herself off of. She is very concerned about her persistent SOB, so made appointment with pulmonology on 10/1 where they set expectations for gradual recovery, and ordered CT chest and PFTs to further evaluate her symptoms. CT scan showed minimal inflammation suggestive of resolving COVID-19 pneumonia. F/u with pulm and PFTs scheduled on 11/5. Notes her symptoms have been worse this past week after returning to work which involve talking on the phone. She reports yellow sputum which is unchanged since her infection, the quantity has been decreasing and quality is unchanged  -f/u with pulmonology on 11/5

## 2019-12-27 NOTE — Assessment & Plan Note (Signed)
Tiny pulmonary embolisms found incidentally on 12/25/2019 CT chest. Recommended f/u non-con CT in 1 year if high-risk. She endorses smoking only a couple of cigarettes as a teenager. 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  -no further workup given low risk history

## 2019-12-28 DIAGNOSIS — E785 Hyperlipidemia, unspecified: Secondary | ICD-10-CM | POA: Insufficient documentation

## 2019-12-28 LAB — URINALYSIS, ROUTINE W REFLEX MICROSCOPIC
Bilirubin, UA: NEGATIVE
Glucose, UA: NEGATIVE
Leukocytes,UA: NEGATIVE
Nitrite, UA: NEGATIVE
Protein,UA: NEGATIVE
RBC, UA: NEGATIVE
Specific Gravity, UA: 1.024 (ref 1.005–1.030)
Urobilinogen, Ur: 1 mg/dL (ref 0.2–1.0)
pH, UA: 6 (ref 5.0–7.5)

## 2019-12-28 MED ORDER — ROSUVASTATIN CALCIUM 10 MG PO TABS: 10.0000 mg | ORAL_TABLET | Freq: Every day | ORAL | 5 refills | Status: DC

## 2019-12-28 NOTE — Addendum Note (Signed)
Addended by: Remo Lipps on: 12/28/2019 03:18 PM   Modules accepted: Orders

## 2019-12-29 ENCOUNTER — Ambulatory Visit: Payer: Medicaid Other | Admitting: Pulmonary Disease

## 2020-01-01 ENCOUNTER — Encounter: Payer: Self-pay | Admitting: Primary Care

## 2020-01-01 ENCOUNTER — Other Ambulatory Visit: Payer: Self-pay

## 2020-01-01 ENCOUNTER — Ambulatory Visit: Payer: Medicaid Other | Admitting: Primary Care

## 2020-01-01 VITALS — BP 132/80 | HR 99 | Temp 97.6°F | Ht 64.0 in | Wt 283.0 lb

## 2020-01-01 DIAGNOSIS — U071 COVID-19: Secondary | ICD-10-CM | POA: Diagnosis not present

## 2020-01-01 DIAGNOSIS — R0602 Shortness of breath: Secondary | ICD-10-CM | POA: Diagnosis not present

## 2020-01-01 DIAGNOSIS — J1282 Pneumonia due to coronavirus disease 2019: Secondary | ICD-10-CM | POA: Diagnosis not present

## 2020-01-01 MED ORDER — FLUTICASONE PROPIONATE 50 MCG/ACT NA SUSP: 1.0000 | Freq: Every day | NASAL | 2 refills | Status: DC

## 2020-01-01 MED ORDER — BREO ELLIPTA 100-25 MCG/INH IN AEPB: 1.0000 | INHALATION_SPRAY | Freq: Every day | RESPIRATORY_TRACT | 0 refills | Status: DC

## 2020-01-01 NOTE — Assessment & Plan Note (Addendum)
Patient continues to have mild-moderate dyspnea when speaking and with ADLs. Associated upper airway cough/PND. Dx with Covid-19 pneumonia in August 2021 requiring hospitalization, treated with remdesivir, decadron and baricitnib. HRCT showed minimal subpleural ground-glass in both lower lobes, felt to reflect inflamation suggestive of resolving covid-19 pneumonia. She had to reschedule PFTs, unable to get covid tested until end of the month. Recommend trial low dose ICS/LABA, start mucinex 600mg  twice daily and flonase nasal spray. We will provide patient with two week sample Breo 100. Needs to reschedule PFTs and follow-up in 4 weeks with Dr. .

## 2020-01-01 NOTE — Patient Instructions (Addendum)
Recommendations: - Sample Breo inhaler- take 1 puff once daily in the morning (rinse mouth after use) If you notice an improvement in breathing with inhaler use please let us know and we will send in prescription for this inhaler or something similar. If not change, finish two week sample and then you can stop  - Take mucinex 600mg  twice daily (over the counter)  - Start flonase nasal spray once daily (RX sent)  - Reschedule PFTs in 1-2 months either after booster or when able to get re-tested for covid  Follow-up:  - 4-6 weeks with Dr. with PFTs   Fluticasone; Vilanterol inhalation powder What is this medicine? FLUTICASONE; VILANTEROL (floo TIK a sone; vye LAN ter ol) inhalation is a combination of two medicines that decrease inflammation and help to open up the airways of your lungs. It is for chronic obstructive pulmonary disease (COPD), including chronic bronchitis or emphysema. It is also used for asthma in adults to help control symptoms. Do NOT use for an acute asthma attack or COPD attack. This medicine may be used for other purposes; ask your health care provider or pharmacist if you have questions. COMMON BRAND NAME(S): BREO ELLIPTA What should I tell my health care provider before I take this medicine? They need to know if you have any of these conditions:  bone problems  diabetes  eye disease, vision problems  immune system problems  heart disease or irregular heartbeat  high blood pressure  infection  pheochromocytoma  seizures  thyroid disease  an unusual or allergic reaction to fluticasone, vilanterol, milk proteins, corticosteroids, other medicines, foods, dyes, or preservatives  pregnant or trying to get pregnant  breast-feeding How should I use this medicine? This medicine is inhaled through the mouth. It is used once per day. Follow the directions on the prescription label. Do not use a spacer device with this inhaler. Take your medicine at  regular intervals. Do not take your medicine more often than directed. Do not stop taking except on your doctor's advice. Make sure that you are using your inhaler correctly. Ask you doctor or health care provider if you have any questions. A special MedGuide will be given to you by the pharmacist with each prescription and refill. Be sure to read this information carefully each time. Talk to your pediatrician regarding the use of this medicine in children. Special care may be needed. This medicine is not approved for use in children under 90 years of age. Overdosage: If you think you have taken too much of this medicine contact a poison control center or emergency room at once. NOTE: This medicine is only for you. Do not share this medicine with others. What if I miss a dose? If you miss a dose, use it as soon as you can. If it is almost time for your next dose, use only that dose and continue with your regular schedule. Do not use double or extra doses. What may interact with this medicine? Do not take this medicine with any of the following medications:  cisapride  dofetilide  dronedarone  MAOIs like Carbex, Eldepryl, Marplan, Nardil, and Parnate  pimozide  thioridazine  ziprasidone This medicine may also interact with the following medications:  antiviral medicines for HIV or AIDS  beta-blockers like metoprolol and propranolol  certain medicines for depression, anxiety, or psychotic disturbances  certain medicines for fungal infections like ketoconazole, itraconazole, posaconazole, voriconazole  conivaptan  diuretics  medicines for colds  nefazodone  other medicines for breathing  problems  other medicines that prolong the QT interval (cause an abnormal heart rhythm) This list may not describe all possible interactions. Give your health care provider a list of all the medicines, herbs, non-prescription drugs, or dietary supplements you use. Also tell them if you smoke,  drink alcohol, or use illegal drugs. Some items may interact with your medicine. What should I watch for while using this medicine? Visit your doctor or health care professional for regular checkups. Tell your doctor or health care professional if your symptoms do not get better. Do not use this medicine more than once every 24 hours. NEVER use this medicine for an acute asthma or COPD attack. You should use your short-acting rescue inhalers for this purpose. If your symptoms get worse or if you need your short-acting inhalers more often, call your doctor right away. If you are going to have surgery tell your doctor or health care professional that you are using this medicine. Try not to come in contact with people with the chicken pox or measles. If you do, call your doctor. This medicine may increase blood sugar. Ask your healthcare provider if changes in diet or medicines are needed if you have diabetes. What side effects may I notice from receiving this medicine? Side effects that you should report to your doctor or health care professional as soon as possible:  allergic reactions like skin rash or hives, swelling of the face, lips, or tongue  breathing problems right after inhaling your medicine  chest pain  fast, irregular heartbeat  feeling faint or lightheaded, falls  fever or chills  nausea, vomiting  signs and symptoms of high blood sugar such as being more thirsty or hungry or having to urinate more than normal. You may also feel very tired or have blurry vision. Side effects that usually do not require medical attention (report to your doctor or health care professional if they continue or are bothersome):  cough  headache  nervousness  sore throat  tremor This list may not describe all possible side effects. Call your doctor for medical advice about side effects. You may report side effects to FDA at 1-800-FDA-1088. Where should I keep my medicine? Keep out of the  reach of children. Store at room temperature between 15 and 30 degrees C (59 and 86 degrees F). Store in a dry place away from direct heat or sunlight. Throw away 6 weeks after you remove the inhaler from the foil tray, or after the dose indicator reads 0, whichever comes first. Throw away any unopened packages after the expiration date. NOTE: This sheet is a summary. It may not cover all possible information. If you have questions about this medicine, talk to your doctor, pharmacist, or health care provider.  2020 Elsevier/Gold Standard (2017-11-10 11:19:39)

## 2020-01-01 NOTE — Progress Notes (Signed)
'@Patient'  ID: Dawn Galvan, female    DOB: 1976-01-31, 44 y.o.   MRN: [id number]  Chief Complaint  Patient presents with  . Follow-up    reports dyspnea improved then declined again since 12/24/19. reports no acute problems today    Referring provider: Delice Bison, DO  HPI: 44 year old female, former smoker quit in 2012.  Past medical history significant for COVID-19 pneumonia, acute respiratory failure with hypoxia, obesity status post bariatric surgery, prediabetes.  Patient of Dr. Vaughan Browner, consult on 11/24/2019 for post Covid dyspnea.  She had a high-resolution CT on 11/1 that showed minimal subpleural groundglass in both lower lobes.  Nonspecific interstitial pneumonitis or UIP cannot be excluded.  In addition she had tiny pleural nodules, recommend follow-up CT in 12 months if patient is high risk.  Dr. Vaughan Browner reviewed CT imaging and felt changes reflected minimal inflammation suggested of resolving Covid pneumonia.  01/01/2020 Presents today for a 1 month follow-up post COVID 19 dyspnea.  She was hospitalized in August 2021 for covid-19 pneumonia, received remdesivir, decadron and baricitnib. She was scheduled for PFTs on 12/29/2019 with an overview with Dr. Vaughan Browner however this appointment was canceled. She can not get covid tested until the end of this month.  She notices shortness of breath more when speaking and with ADLs such as shopping. She is constantly needing to clear her throat to get mucus up. Her weight fluctuates 5 lbs. She follows with bariatric surgeon, looking to have skin removal.   Imaging: 12/25/19 HRCT- Minimal subpleural ground-glass in the posterolateral aspects of both lower lobes is nonspecific and can be seen with atelectasis. Nonspecific interstitial pneumonitis or usual interstitial pneumonitis cannot be excluded. Tiny pulmonary nodules. No follow-up needed if patient is low-risk (and has no known or suspected primary neoplasm). Non-contrast chest CT can  be considered in 12 months if patient is high-risk.    Allergies  Allergen Reactions  . Doxycycline Nausea And Vomiting    Dizziness   . Other Itching and Other (See Comments)    Seasonal allergies- Itchy eyes, runny nose, etc..    Immunization History  Administered Date(s) Administered  . Tdap 03/16/2019    Past Medical History:  Diagnosis Date  . Carpal tunnel syndrome    right  . 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 Family history of prostate cancer   . GERD (gastroesophageal reflux disease)   . Headache(784.0)    migraines or sinus  . History of vertigo    states comes and goes; no current med.  . Obesity   . Osteoarthritis   . PONV (postoperative nausea and vomiting)   . Rash 08/10/2013   right thigh  . Sleep apnea    does not wear cpap    Tobacco History: Social History   Tobacco Use  Smoking Status Former Smoker  . Years: 0.00  . Quit date: 02/22/2009  . Years since quitting: 10.8  Smokeless Tobacco Never Used   Counseling given: Not Answered   Outpatient Medications Prior to Visit  Medication Sig Dispense Refill  . acetaminophen (TYLENOL 8 HOUR) 650 MG CR tablet Take 1 tablet (650 mg total) by mouth every 8 (eight) hours as needed for pain. 30 tablet 0  . diclofenac Sodium (VOLTAREN) 1 % GEL Apply 2 g topically 4 (four) times daily. 50 g 3  . hydrocortisone cream 1 % Apply to affected area 2 times daily  15 g 0  . ibuprofen (ADVIL) 600 MG tablet Take 1 tablet (600 mg total) by mouth every 6 (six) hours as needed. 30 tablet 0  . LORazepam (ATIVAN) 1 MG tablet Take 1 tablet (1 mg total) by mouth as needed for anxiety (before procedure). 30 tablet 0  . ondansetron (ZOFRAN-ODT) 8 MG disintegrating tablet Take 8 mg by mouth 3 (three) times daily as needed for nausea or vomiting (DISSOLVE ORALLY).     Marland Kitchen PHENTERMINE HCL PO Take by mouth.    . rosuvastatin (CRESTOR) 10 MG tablet Take 1  tablet (10 mg total) by mouth daily. 30 tablet 5  . sertraline (ZOLOFT) 50 MG tablet Take 1 tablet (50 mg total) by mouth daily. 30 tablet 2  . topiramate (TOPAMAX) 25 MG tablet Take 25 mg by mouth 2 (two) times daily.     No facility-administered medications prior to visit.   Review of Systems  Review of Systems  Constitutional: Negative.   HENT: Positive for postnasal drip.   Respiratory: Positive for cough and shortness of breath. Negative for chest tightness and wheezing.   Cardiovascular: Negative.     Physical Exam  BP 132/80   Pulse 99   Temp 97.6 F (36.4 C)   Ht '5\' 4"'  (1.626 m)   Wt 283 lb (128.4 kg)   SpO2 100%   BMI 48.58 kg/m  Physical Exam Constitutional:      General: She is not in acute distress.    Appearance: Normal appearance. She is obese. She is not ill-appearing.  HENT:     Head: Normocephalic and atraumatic.     Mouth/Throat:     Mouth: Mucous membranes are moist.     Pharynx: Oropharynx is clear. No oropharyngeal exudate or posterior oropharyngeal erythema.  Cardiovascular:     Rate and Rhythm: Normal rate and regular rhythm.     Pulses: Normal pulses.     Heart sounds: Normal heart sounds.  Pulmonary:     Effort: Pulmonary effort is normal.     Breath sounds: Normal breath sounds. No wheezing, rhonchi or rales.  Musculoskeletal:        General: Normal range of motion.  Skin:    General: Skin is warm and dry.  Neurological:     General: No focal deficit present.     Mental Status: She is alert and oriented to person, place, and time. Mental status is at baseline.  Psychiatric:        Mood and Affect: Mood normal.        Behavior: Behavior normal.        Thought Content: Thought content normal.        Judgment: Judgment normal.     Lab Results:  CBC    Component Value Date/Time   WBC 6.2 10/19/2019 1455   RBC 5.07 10/19/2019 1455   HGB 14.9 10/19/2019 1455   HGB 13.0 02/28/2016 1117   HCT 46.4 (H) 10/19/2019 1455   HCT 39.3  02/28/2016 1117   PLT 241 10/19/2019 1455   PLT 296 02/28/2016 1117   MCV 91.5 10/19/2019 1455   MCV 88 02/28/2016 1117   MCH 29.4 10/19/2019 1455   MCHC 32.1 10/19/2019 1455   RDW 13.8 10/19/2019 1455   RDW 15.8 (H) 02/28/2016 1117   LYMPHSABS 0.9 10/19/2019 1455   MONOABS 0.2 10/19/2019 1455   EOSABS 0.0 10/19/2019 1455   BASOSABS 0.0 10/19/2019 1455    BMET    Component Value Date/Time   NA  142 12/27/2019 1533   NA 143 02/28/2016 1117   K 4.2 12/27/2019 1533   CL 109 12/27/2019 1533   CO2 18 (L) 12/27/2019 1533   GLUCOSE 89 12/27/2019 1533   BUN 9 12/27/2019 1533   BUN 9 02/28/2016 1117   CREATININE 0.89 12/27/2019 1533   CALCIUM 10.0 12/27/2019 1533   GFRNONAA >60 12/27/2019 1533   GFRAA >60 10/19/2019 1455    BNP No results found for: BNP  ProBNP No results found for: PROBNP  Imaging: CT Chest High Resolution  Result Date: 12/25/2019 CLINICAL DATA:  Shortness of breath, chest pain. History of pneumonia. EXAM: CT CHEST WITHOUT CONTRAST TECHNIQUE: Multidetector CT imaging of the chest was performed following the standard protocol without intravenous contrast. High resolution imaging of the lungs, as well as inspiratory and expiratory imaging, was performed. COMPARISON:  None. FINDINGS: Cardiovascular: Atherosclerotic calcification of the aorta. Heart size normal. No pericardial effusion. Mediastinum/Nodes: No pathologically enlarged mediastinal or axillary lymph nodes. Hilar regions are difficult to evaluate without IV contrast. Esophagus is grossly unremarkable. Lungs/Pleura: There is minimal subpleural ground-glass in the posterolateral aspects of the lower lobes. No definitive subpleural reticulation, traction bronchiectasis/bronchiolectasis, architectural distortion or honeycombing. Tiny pulmonary nodules measure up to 3 mm in the medial right lower lobe (8/93). No pleural fluid. Airway is unremarkable. Expiratory phase imaging was not performed in true expiration,  limiting the evaluation for air trapping. Upper Abdomen: Visualized portions of the liver and adrenal glands are unremarkable. Punctate stone in the right kidney. Visualized portions of the kidneys, spleen and pancreas are otherwise unremarkable. Postoperative changes in the stomach. Cholecystectomy. Musculoskeletal: Degenerative changes in the spine. No worrisome lytic or sclerotic lesions. IMPRESSION: 1. Minimal subpleural ground-glass in the posterolateral aspects of both lower lobes is nonspecific and can be seen with atelectasis. Nonspecific interstitial pneumonitis or usual interstitial pneumonitis cannot be excluded. Prone imaging on future high-resolution chest CT would be helpful. Findings are indeterminate for UIP per consensus guidelines: Diagnosis of Idiopathic Pulmonary Fibrosis: An Official ATS/ERS/JRS/ALAT Clinical Practice Guideline. [place], Iss 5, [phone], Oct 24 2016. 2. Tiny pulmonary nodules. No follow-up needed if patient is low-risk (and has no known or suspected primary neoplasm). Non-contrast chest CT can be considered in 12 months if patient is high-risk. This recommendation follows the consensus statement: Guidelines for Management of Incidental Pulmonary Nodules Detected on CT Images: From the Fleischner Society 2017; Radiology 2017; 284:228-243. 3. Punctate right renal stone. 4.  Aortic atherosclerosis (ICD10-I70.0). Electronically Signed   By: Lorin Picket M.D.   On: 12/25/2019 12:52   DG Foot Complete Right  Result Date: 12/25/2019 CLINICAL DATA:  Foot pain EXAM: RIGHT FOOT COMPLETE - 3+ VIEW COMPARISON:  03/26/2017 FINDINGS: Three view radiograph right foot demonstrates normal alignment. No fracture or dislocation. Joint spaces are preserved. No ankle effusion. Large superior calcaneal spur is again noted. IMPRESSION: Large superior calcaneal spur. Electronically Signed   By: Fidela Salisbury MD   On: 12/25/2019 23:36     Assessment & Plan:    Pneumonia due to COVID-19 virus Patient continues to have mild-moderate dyspnea when speaking and with ADLs. Associated upper airway cough/PND. Dx with Covid-19 pneumonia in August 2021 requiring hospitalization, treated with remdesivir, decadron and baricitnib. HRCT showed minimal subpleural ground-glass in both lower lobes, felt to reflect inflamation suggestive of resolving covid-19 pneumonia. She had to reschedule PFTs, unable to get covid tested until end of the month. Recommend trial low dose ICS/LABA, start mucinex  622m twice daily and flonase nasal spray. We will provide patient with two week sample Breo 100. Needs to reschedule PFTs and follow-up in 4 weeks with Dr. MVaughan Browner      EMartyn Ehrich NP 01/01/2020

## 2020-01-02 NOTE — Progress Notes (Signed)
Internal Medicine Clinic Attending  I saw and evaluated the patient.  I personally confirmed the key portions of the history and exam documented by Dr. Chen and I reviewed pertinent patient test results.  The assessment, diagnosis, and plan were formulated together and I agree with the documentation in the resident's note.  

## 2020-01-05 DIAGNOSIS — Z9884 Bariatric surgery status: Secondary | ICD-10-CM | POA: Diagnosis not present

## 2020-01-05 DIAGNOSIS — Z6841 Body Mass Index (BMI) 40.0 and over, adult: Secondary | ICD-10-CM | POA: Diagnosis not present

## 2020-01-05 DIAGNOSIS — Z713 Dietary counseling and surveillance: Secondary | ICD-10-CM | POA: Diagnosis not present

## 2020-01-15 ENCOUNTER — Telehealth: Payer: Self-pay | Admitting: Primary Care

## 2020-01-15 MED ORDER — BREO ELLIPTA 100-25 MCG/INH IN AEPB: 1.0000 | INHALATION_SPRAY | Freq: Every day | RESPIRATORY_TRACT | 11 refills | Status: DC

## 2020-01-15 NOTE — Telephone Encounter (Signed)
Dawn Galvan is not meant to be taken more than 1 puff daily  If working well we can send rx for this  Called the pt and there was no answer- LMTCB

## 2020-01-15 NOTE — Telephone Encounter (Signed)
Rx for Breo 100 has been sent to the [facility] with the pt and notified this was done  Nothing further needed

## 2020-01-15 NOTE — Telephone Encounter (Signed)
Patient returning call.  Pt aware that she can't take more than once daily.  Requesting prescription to be sent in.  Call before 9 a.m. due to work.   [facility] [address]  [phone]

## 2020-01-17 ENCOUNTER — Telehealth: Payer: Self-pay | Admitting: Primary Care

## 2020-01-19 NOTE — Telephone Encounter (Signed)
PA [facility] was received [facility] Phone:[phone] Fax: [phone] Medication name and strength: Breo 100-63mcg/inh  Ordering Provider: Ames Dura NP   Was PA started with [facility]?: yes If yes, please enter KEY: [id number] Medication tried and failed: NONE Covered Alternatives:  Symbicort, duler, advair HFA and diskus  PA sent to plan, time frame for approval / denial: didn't say sent on 01/19/20 Routing to EP for follow-up

## 2020-01-21 ENCOUNTER — Other Ambulatory Visit: Payer: Self-pay | Admitting: Oncology

## 2020-01-21 DIAGNOSIS — Z1501 Genetic susceptibility to malignant neoplasm of breast: Secondary | ICD-10-CM

## 2020-01-22 ENCOUNTER — Encounter: Payer: Self-pay | Admitting: Oncology

## 2020-01-22 ENCOUNTER — Other Ambulatory Visit: Payer: Medicaid Other

## 2020-01-22 ENCOUNTER — Telehealth: Payer: Self-pay

## 2020-01-22 ENCOUNTER — Other Ambulatory Visit: Payer: Self-pay | Admitting: Oncology

## 2020-01-22 MED ORDER — LORAZEPAM 1 MG PO TABS: 1.0000 mg | ORAL_TABLET | ORAL | 0 refills | Status: DC | PRN

## 2020-01-22 NOTE — Telephone Encounter (Signed)
This LPN called pt to make her aware Dr Darnelle Catalan [facility] to [facility] [address]. Pt verbalized thanks and understanding.

## 2020-01-23 ENCOUNTER — Other Ambulatory Visit: Payer: Self-pay | Admitting: Primary Care

## 2020-01-23 MED ORDER — MOMETASONE FURO-FORMOTEROL FUM 100-5 MCG/ACT IN AERO: 2.0000 | INHALATION_SPRAY | Freq: Two times a day (BID) | RESPIRATORY_TRACT | 3 refills | Status: DC

## 2020-01-23 NOTE — Telephone Encounter (Signed)
I have sent in Dulera 100, take 2 puffs twice daily (morning and evening). Rinse mouth after use.

## 2020-01-23 NOTE — Telephone Encounter (Signed)
Patient states insurance is not covering the [facility] inhaler. Needs prior authorization. Patient phone number is [phone].

## 2020-01-23 NOTE — Telephone Encounter (Signed)
Called pharmacy and pharmacist states that Elwin Sleight was going through insurance now. Called pt to make aware. Nothing further needed at this time- will close encounter.

## 2020-01-23 NOTE — Telephone Encounter (Signed)
lmtcb for pt.  

## 2020-01-23 NOTE — Telephone Encounter (Signed)
Checked cover my meds for PA update and the PA has been denied due to pt not trying/failing two of the preferred meds.  Preferred inhalers: -Advair Diskus -Advair HFA -Dulera -Symbicort  Beth, please advise on alternate inhaler for pt.

## 2020-01-24 ENCOUNTER — Ambulatory Visit
Admission: RE | Admit: 2020-01-24 | Discharge: 2020-01-24 | Disposition: A | Discharge status: Home / Self Care | Payer: Medicaid Other | Source: Ambulatory Visit | Attending: Oncology | Admitting: Oncology

## 2020-01-24 ENCOUNTER — Other Ambulatory Visit: Payer: Self-pay

## 2020-01-24 DIAGNOSIS — Z1589 Genetic susceptibility to other disease: Secondary | ICD-10-CM

## 2020-01-24 DIAGNOSIS — Z1509 Genetic susceptibility to other malignant neoplasm: Secondary | ICD-10-CM

## 2020-01-24 MED ORDER — GADOBUTROL 1 MMOL/ML IV SOLN
10.0000 mL | Freq: Once | INTRAVENOUS | Status: AC | PRN
  Administered 2020-01-24: 10 mL via INTRAVENOUS

## 2020-01-26 ENCOUNTER — Other Ambulatory Visit: Payer: Self-pay | Admitting: Oncology

## 2020-01-26 ENCOUNTER — Telehealth: Payer: Self-pay | Admitting: *Deleted

## 2020-01-26 DIAGNOSIS — R599 Enlarged lymph nodes, unspecified: Secondary | ICD-10-CM

## 2020-01-26 NOTE — Telephone Encounter (Signed)
Called pt with MRI results and need for further bx to right breast and [place] of right axilla. Received verbal understanding. GI to call pt with appts.

## 2020-01-27 ENCOUNTER — Inpatient Hospital Stay (HOSPITAL_COMMUNITY): Admission: RE | Admit: 2020-01-27 | Payer: Medicaid Other | Source: Ambulatory Visit

## 2020-01-29 ENCOUNTER — Other Ambulatory Visit (HOSPITAL_COMMUNITY)
Admission: RE | Admit: 2020-01-29 | Discharge: 2020-01-29 | Disposition: A | Discharge status: Home / Self Care | Payer: Medicaid Other | Source: Ambulatory Visit | Attending: Pulmonary Disease | Admitting: Pulmonary Disease

## 2020-01-29 DIAGNOSIS — Z20822 Contact with and (suspected) exposure to covid-19: Secondary | ICD-10-CM | POA: Insufficient documentation

## 2020-01-29 DIAGNOSIS — Z01812 Encounter for preprocedural laboratory examination: Secondary | ICD-10-CM | POA: Insufficient documentation

## 2020-01-29 LAB — SARS CORONAVIRUS 2 (TAT 6-24 HRS): SARS Coronavirus 2: NEGATIVE

## 2020-01-31 ENCOUNTER — Ambulatory Visit (INDEPENDENT_AMBULATORY_CARE_PROVIDER_SITE_OTHER): Payer: Medicaid Other | Admitting: Pulmonary Disease

## 2020-01-31 ENCOUNTER — Ambulatory Visit: Payer: Medicaid Other

## 2020-01-31 ENCOUNTER — Other Ambulatory Visit: Payer: Self-pay

## 2020-01-31 ENCOUNTER — Other Ambulatory Visit: Payer: Self-pay | Admitting: *Deleted

## 2020-01-31 ENCOUNTER — Encounter: Payer: Self-pay | Admitting: Oncology

## 2020-01-31 DIAGNOSIS — R0602 Shortness of breath: Secondary | ICD-10-CM | POA: Diagnosis not present

## 2020-01-31 LAB — PULMONARY FUNCTION TEST
DL/VA % pred: 125 %
DL/VA: 5.5 ml/min/mmHg/L
DLCO cor % pred: 83 %
DLCO cor: 18 ml/min/mmHg
DLCO unc % pred: 83 %
DLCO unc: 18 ml/min/mmHg
FEF 25-75 Post: 2.12 L/sec
FEF 25-75 Pre: 2.64 L/sec
FEF2575-%Change-Post: -19 %
FEF2575-%Pred-Post: 78 %
FEF2575-%Pred-Pre: 97 %
FEV1-%Change-Post: -4 %
FEV1-%Pred-Post: 80 %
FEV1-%Pred-Pre: 83 %
FEV1-Post: 1.99 L
FEV1-Pre: 2.08 L
FEV1FVC-%Change-Post: 1 %
FEV1FVC-%Pred-Pre: 103 %
FEV6-%Change-Post: -3 %
FEV6-%Pred-Post: 76 %
FEV6-%Pred-Pre: 79 %
FEV6-Post: 2.28 L
FEV6-Pre: 2.35 L
FEV6FVC-%Pred-Post: 102 %
FEV6FVC-%Pred-Pre: 102 %
FVC-%Change-Post: -5 %
FVC-%Pred-Post: 75 %
FVC-%Pred-Pre: 79 %
FVC-Post: 2.29 L
FVC-Pre: 2.43 L
Post FEV1/FVC ratio: 87 %
Post FEV6/FVC ratio: 100 %
Pre FEV1/FVC ratio: 86 %
Pre FEV6/FVC Ratio: 100 %
RV % pred: 99 %
RV: 1.64 L
TLC % pred: 78 %
TLC: 3.95 L

## 2020-01-31 NOTE — Progress Notes (Signed)
Full PFT performed today. °

## 2020-02-01 ENCOUNTER — Other Ambulatory Visit: Payer: Self-pay | Admitting: *Deleted

## 2020-02-01 ENCOUNTER — Ambulatory Visit: Payer: Medicaid Other | Admitting: Pulmonary Disease

## 2020-02-02 ENCOUNTER — Other Ambulatory Visit: Payer: Self-pay | Admitting: Oncology

## 2020-02-02 ENCOUNTER — Ambulatory Visit
Admission: RE | Admit: 2020-02-02 | Discharge: 2020-02-02 | Disposition: A | Discharge status: Home / Self Care | Payer: Medicaid Other | Source: Ambulatory Visit | Attending: Oncology | Admitting: Oncology

## 2020-02-02 ENCOUNTER — Ambulatory Visit: Payer: Medicaid Other | Admitting: Pulmonary Disease

## 2020-02-02 ENCOUNTER — Other Ambulatory Visit: Payer: Self-pay

## 2020-02-02 DIAGNOSIS — R599 Enlarged lymph nodes, unspecified: Secondary | ICD-10-CM

## 2020-02-02 DIAGNOSIS — N6310 Unspecified lump in the right breast, unspecified quadrant: Secondary | ICD-10-CM | POA: Diagnosis not present

## 2020-02-02 MED ORDER — GADOBUTROL 1 MMOL/ML IV SOLN
9.0000 mL | Freq: Once | INTRAVENOUS | Status: AC | PRN
  Administered 2020-02-02: 9 mL via INTRAVENOUS

## 2020-02-07 ENCOUNTER — Encounter: Payer: Self-pay | Admitting: Primary Care

## 2020-02-07 ENCOUNTER — Ambulatory Visit (INDEPENDENT_AMBULATORY_CARE_PROVIDER_SITE_OTHER): Payer: Medicaid Other | Admitting: Primary Care

## 2020-02-07 ENCOUNTER — Other Ambulatory Visit: Payer: Self-pay

## 2020-02-07 VITALS — BP 134/82 | HR 85 | Temp 97.6°F | Ht 64.5 in | Wt 287.2 lb

## 2020-02-07 DIAGNOSIS — R918 Other nonspecific abnormal finding of lung field: Secondary | ICD-10-CM

## 2020-02-07 DIAGNOSIS — J1282 Pneumonia due to coronavirus disease 2019: Secondary | ICD-10-CM

## 2020-02-07 DIAGNOSIS — J984 Other disorders of lung: Secondary | ICD-10-CM | POA: Diagnosis not present

## 2020-02-07 DIAGNOSIS — R0602 Shortness of breath: Secondary | ICD-10-CM | POA: Diagnosis not present

## 2020-02-07 DIAGNOSIS — U071 COVID-19: Secondary | ICD-10-CM

## 2020-02-07 LAB — D-DIMER, QUANTITATIVE: D-Dimer, Quant: 0.38 mcg/mL FEU (ref <0.50)

## 2020-02-07 MED ORDER — FLUTICASONE-SALMETEROL 250-50 MCG/DOSE IN AEPB: 1.0000 | INHALATION_SPRAY | Freq: Two times a day (BID) | RESPIRATORY_TRACT | 2 refills | Status: DC

## 2020-02-07 NOTE — Assessment & Plan Note (Addendum)
-   Persistent dyspnea since being dx with COVID-19 in August 2021. She was hospitalized and received remdesivir, decadron and baricitnib. HRCT in November 2021 showed minimal subpleural ground glass in both lower lobes. Checking d-dimer d/t dyspnea and chest discomfort. Continue to encourage physical activity and incentive spirometer daily.

## 2020-02-07 NOTE — Progress Notes (Signed)
'@Patient'  ID: Finis Bud, female    DOB: [birth year], 44 y.o.   MRN: [id number]  Chief Complaint  Patient presents with  . Follow-up    Referring provider: Delice Bison, DO  HPI: 44 year old female, former light smoker.  Past medical history significant for COVID-19 pneumonia, acute respiratory failure with hypoxia, obesity status post bariatric surgery, prediabetes.  Patient of Dr. Vaughan Browner, consult on 11/24/2019 for post Covid dyspnea.  She had a high-resolution CT on 11/1 that showed minimal subpleural groundglass in both lower lobes.  Nonspecific interstitial pneumonitis or UIP cannot be excluded.  In addition she had tiny pleural nodules, recommend follow-up CT in 12 months if patient is high risk.  Dr. Vaughan Browner reviewed CT imaging and felt changes reflected minimal inflammation suggested of resolving Covid pneumonia.  Previous [facility] pulmonary encounter: 01/01/2020 Presents today for a 1 month follow-up post COVID 19 dyspnea.  She was hospitalized in August 2021 for covid-19 pneumonia, received remdesivir, decadron and baricitnib. She was scheduled for PFTs on 12/29/2019 with an overview with Dr. Vaughan Browner however this appointment was canceled. She can not get covid tested until the end of this month.  She notices shortness of breath more when speaking and with ADLs such as shopping. She is constantly needing to clear her throat to get mucus up. Her weight fluctuates 5 lbs. She follows with bariatric surgeon, looking to have skin removal.   02/07/2020 - Interim hx Patient presents today for 4-6 week follow-up with PFTs. She was hospitalized with covid-19 pneumonia in August 2021. HRCT showed minimal subpleural ground glass in both lower lobes, felt to reflect inflammation suggestive of resolving covid-19 pneumonia. D/t persistent dyspnea we started her on trial low dose ICS/LABA during last visit. Currently on Dulera 100 d/t insurance coverage, feels Memory Dance worked better and lasted longer.  She needs to try two alternatives before PA could be processed for [facility]. She continues to have some shortness of breath, nasal congestion and intermittent chest discomfort. She is unsure if it is due to her breathing or when she is out in the cold. Reports that she notices herself holding her breath at times. She had an incentive spirometer but it broke.    Pulmonary function testing: FVC 2.29 (75%), FEV1 1.99 (80%), ratio 87, DLCOunc 18 (83%) p using Dulera  Mild restriction, no BD response. Normal diffusion capacity, however, not correlated for hgb  Imaging: 12/25/19 HRCT- Minimal subpleural ground-glass in the posterolateral aspects of both lower lobes is nonspecific and can be seen with atelectasis. Nonspecific interstitial pneumonitis or usual interstitial pneumonitis cannot be excluded. Tiny pulmonary nodules. No follow-up needed if patient is low-risk (and has no known or suspected primary neoplasm). Non-contrast chest CT can be considered in 12 months if patient is high-risk.     Allergies  Allergen Reactions  . Doxycycline Nausea And Vomiting    Dizziness   . Other Itching and Other (See Comments)    Seasonal allergies- Itchy eyes, runny nose, etc..    Immunization History  Administered Date(s) Administered  . PFIZER SARS-COV-2 Vaccination 01/28/2020  . Tdap 03/16/2019    Past Medical History:  Diagnosis Date  . Carpal tunnel syndrome    right  . 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 Family history of prostate cancer   . GERD (gastroesophageal reflux disease)   . Headache(784.0)    migraines or sinus  . History of vertigo  states comes and goes; no current med.  . Obesity   . Osteoarthritis   . PONV (postoperative nausea and vomiting)   . Rash 08/10/2013   right thigh  . Sleep apnea    does not wear cpap    Tobacco History: Social History   Tobacco Use  Smoking Status Former Smoker  .  Years: 0.00  . Quit date: 02/22/2009  . Years since quitting: 10.9  Smokeless Tobacco Never Used   Counseling given: Not Answered   Outpatient Medications Prior to Visit  Medication Sig Dispense Refill  . acetaminophen (TYLENOL 8 HOUR) 650 MG CR tablet Take 1 tablet (650 mg total) by mouth every 8 (eight) hours as needed for pain. 30 tablet 0  . diclofenac Sodium (VOLTAREN) 1 % GEL Apply 2 g topically 4 (four) times daily. 50 g 3  . fluticasone (FLONASE) 50 MCG/ACT nasal spray Place 1 spray into both nostrils daily. 16 g 2  . hydrocortisone cream 1 % Apply to affected area 2 times daily 15 g 0  . ibuprofen (ADVIL) 600 MG tablet Take 1 tablet (600 mg total) by mouth every 6 (six) hours as needed. 30 tablet 0  . LORazepam (ATIVAN) 1 MG tablet Take 1 tablet (1 mg total) by mouth as needed for anxiety (before procedure). 12 tablet 0  . ondansetron (ZOFRAN-ODT) 8 MG disintegrating tablet Take 8 mg by mouth 3 (three) times daily as needed for nausea or vomiting (DISSOLVE ORALLY).     Marland Kitchen PHENTERMINE HCL PO Take by mouth.    . rosuvastatin (CRESTOR) 10 MG tablet Take 1 tablet (10 mg total) by mouth daily. 30 tablet 5  . sertraline (ZOLOFT) 50 MG tablet Take 1 tablet (50 mg total) by mouth daily. 30 tablet 2  . topiramate (TOPAMAX) 25 MG tablet Take 25 mg by mouth 2 (two) times daily.    . mometasone-formoterol (DULERA) 100-5 MCG/ACT AERO Inhale 2 puffs into the lungs 2 (two) times daily. 1 each 3   No facility-administered medications prior to visit.   Review of Systems  Review of Systems  Constitutional: Negative.   HENT: Positive for congestion and postnasal drip.   Respiratory: Positive for cough, chest tightness and shortness of breath.   Cardiovascular: Negative.    Physical Exam  BP 134/82 (BP Location: Right Arm, Cuff Size: Normal)   Pulse 85   Temp 97.6 F (36.4 C) (Oral)   Ht 5' 4.5" (1.638 m)   Wt 287 lb 3.2 oz (130.3 kg)   SpO2 100%   BMI 48.54 kg/m  Physical  Exam Constitutional:      Appearance: Normal appearance.  HENT:     Head: Normocephalic and atraumatic.     Mouth/Throat:     Comments: Deferred d/t masking Cardiovascular:     Rate and Rhythm: Normal rate and regular rhythm.     Comments: RRR Pulmonary:     Effort: Pulmonary effort is normal.     Breath sounds: Normal breath sounds. No wheezing, rhonchi or rales.     Comments: CTA Musculoskeletal:        General: Normal range of motion.  Neurological:     General: No focal deficit present.     Mental Status: She is alert and oriented to person, place, and time. Mental status is at baseline.  Psychiatric:        Mood and Affect: Mood normal.        Behavior: Behavior normal.        Thought  Content: Thought content normal.        Judgment: Judgment normal.      Lab Results:  CBC    Component Value Date/Time   WBC 6.2 10/19/2019 1455   RBC 5.07 10/19/2019 1455   HGB 14.9 10/19/2019 1455   HGB 13.0 02/28/2016 1117   HCT 46.4 (H) 10/19/2019 1455   HCT 39.3 02/28/2016 1117   PLT 241 10/19/2019 1455   PLT 296 02/28/2016 1117   MCV 91.5 10/19/2019 1455   MCV 88 02/28/2016 1117   MCH 29.4 10/19/2019 1455   MCHC 32.1 10/19/2019 1455   RDW 13.8 10/19/2019 1455   RDW 15.8 (H) 02/28/2016 1117   LYMPHSABS 0.9 10/19/2019 1455   MONOABS 0.2 10/19/2019 1455   EOSABS 0.0 10/19/2019 1455   BASOSABS 0.0 10/19/2019 1455    BMET    Component Value Date/Time   NA 142 12/27/2019 1533   NA 143 02/28/2016 1117   K 4.2 12/27/2019 1533   CL 109 12/27/2019 1533   CO2 18 (L) 12/27/2019 1533   GLUCOSE 89 12/27/2019 1533   BUN 9 12/27/2019 1533   BUN 9 02/28/2016 1117   CREATININE 0.89 12/27/2019 1533   CALCIUM 10.0 12/27/2019 1533   GFRNONAA >60 12/27/2019 1533   GFRAA >60 10/19/2019 1455    BNP No results found for: BNP  ProBNP No results found for: PROBNP  Imaging: MR BREAST RIGHT W WO CONTRAST INC CAD  Result Date: 02/02/2020 CLINICAL DATA:  44 year old female  presents for tissue sampling of 2 adjacent 5 mm masses within the LOWER RIGHT breast. As discussed below, these 2 masses are not visualized with certainty on today's exam and therefore not sampled. LABS:  None performed today EXAM: MR OF THE RIGHT BREAST WITH AND WITHOUT CONTRAST TECHNIQUE: Multiplanar, multisequence MR images of the right breast were obtained prior to and following the intravenous administration of 9 ml of Gadavist. Three-dimensional MR images were rendered by post-processing of the original MR data on an independent workstation. The three-dimensional MR images were interpreted, and findings are reported in the following complete MRI report for this study. Three dimensional images were evaluated at the independent DynaCad workstation COMPARISON:  Previous exam(s). FINDINGS: Breast composition: a. Almost entirely fat. Background parenchymal enhancement: Minimal Right breast: The 2 adjacent 5 mm masses within the LOWER RIGHT breast identified on the 01/24/2020 MR are not identified with certainty even on extremely delayed images and therefore not sampled. Multiple other circumscribed oval masses/lymph nodes within the RIGHT breast are again noted. No new or suspicious abnormalities are present. Lymph nodes: The prominent RIGHT axillary lymph node with apparent cortical thickening is again noted and unchanged from 01/24/2020. Ancillary findings:  None. IMPRESSION: 1. The 2 adjacent 5 mm LOWER RIGHT breast masses recommended to be biopsied from 01/24/2020 MR are not definitely visualized today and therefore not sampled. Six-month MR follow-up is recommended. RECOMMENDATION: Bilateral breast MRI in 6 months. BI-RADS CATEGORY  3: Probably benign. Electronically Signed   By: Margarette Canada M.D.   On: 02/02/2020 10:35   MR BREAST BILATERAL W WO CONTRAST INC CAD  Result Date: 01/24/2020 CLINICAL DATA:  Patient with elevated lifetime risk breast cancer. High risk screening exam. Patient has a strong family  history of breast cancer and is positive for PALB 2 gene mutation EXAM: BILATERAL BREAST MRI WITH AND WITHOUT CONTRAST TECHNIQUE: Multiplanar, multisequence MR images of both breasts were obtained prior to and following the intravenous administration of 10 ml of Gadavist Three-dimensional  MR images were rendered by post-processing of the original MR data on an independent workstation. The three-dimensional MR images were interpreted, and findings are reported in the following complete MRI report for this study. Three dimensional images were evaluated at the independent interpreting workstation using the DynaCAD thin client. COMPARISON:  Previous exams including MRI breast 12/10/2018 and MRI breast 01/10/2019 FINDINGS: Breast composition: a. Almost entirely fat. Background parenchymal enhancement: Minimal Right breast: Within the posteroinferior right breast there are 2 adjacent lobular enhancing 5 mm masses (image 150; series 6). No additional suspicious areas of enhancement identified within the right breast. Left breast: No mass or abnormal enhancement. Lymph nodes: Suggestion of at least 1 prominent/mildly enlarged right axillary lymph node measuring up to 11 mm (image 106; series 3). Ancillary findings:  None. IMPRESSION: 1. Two adjacent indeterminate 5 mm enhancing masses within the inferior right breast. 2. Suggestion of at least 1 cortically thickened right axillary lymph node. RECOMMENDATION: 1. MRI guided core needle biopsy of the 2 adjacent 5 mm masses within the inferior right breast. 2. Second-look ultrasound of the right axilla. If any enlarged right axillary lymph nodes are identified, consider further evaluation with percutaneous biopsy. BI-RADS CATEGORY  4: Suspicious. Electronically Signed   By: Lovey Newcomer M.D.   On: 01/24/2020 12:36     Assessment & Plan:   Restrictive airway disease - PFTs today showed mild restriction without BD response. She did use Dulera prior to testing. Patient  reports noticeable improvement with ICS/LABA. Prefers BREO to [place]. She needs to try two alternative before PA can be processed. Plan finish dulera and then switch to Advair 250-77mg 1 puff twice daily.   Pneumonia due to COVID-19 virus - Persistent dyspnea since being dx with COVID-19 in August 2021. She was hospitalized and received remdesivir, decadron and baricitnib. HRCT in November 2021 showed minimal subpleural ground glass in both lower lobes. Checking d-dimer d/t dyspnea and chest discomfort. Continue to encourage physical activity and incentive spirometer daily.   Pulmonary nodules Tiny pulmonary nodules found on HRCT in November 2021 Patient is low-risk no further work up   EMartyn Ehrich NP 02/07/2020

## 2020-02-07 NOTE — Patient Instructions (Addendum)
Recommendations: - Christinia Gully and then start Advair  - Use incentive spirometer 10 breaths 3-4 times a day  - Continue to work on activity level   Rx: - Advair 1 puff twice daily (rinse mouth after use)  Ordes: - Needs new IS  - Labs today (D-dimer- ordered)  Follow-up: - 3 months with Dr. Isaiah Serge

## 2020-02-07 NOTE — Assessment & Plan Note (Signed)
Tiny pulmonary nodules found on HRCT in November 2021 Patient is low-risk no further work up

## 2020-02-07 NOTE — Assessment & Plan Note (Signed)
-   PFTs today showed mild restriction without BD response. She did use Dulera prior to testing. Patient reports noticeable improvement with ICS/LABA. Prefers BREO to [place]. She needs to try two alternative before PA can be processed. Plan finish dulera and then switch to Advair 250-62mcg 1 puff twice daily.

## 2020-02-07 NOTE — Progress Notes (Signed)
D-dimer was normal. Low suspicion for PE. No further testing needed at this time.

## 2020-02-12 ENCOUNTER — Telehealth: Payer: Self-pay

## 2020-02-12 NOTE — Telephone Encounter (Signed)
Pt mom wants to know if she have consent to talk to the physician, I explain HIPPA she would like a nurse to call her back [phone]

## 2020-02-13 NOTE — Telephone Encounter (Signed)
Her mother called and states pt

## 2020-02-14 ENCOUNTER — Telehealth: Payer: Self-pay | Admitting: Primary Care

## 2020-02-14 NOTE — Telephone Encounter (Signed)
Patient has not been contacted by [employer] to receive an incentive spirometer.  I see the order was placed on 12/15 at office visit.  PCCs please advise on status of order, thanks!

## 2020-02-15 NOTE — Telephone Encounter (Signed)
Called Melissa at [employer].  Spirometer was shipped to retail store and they were to call pt to pick up.  If pt wants it shipped to her she can call customer service & request delivery.  I called pt & made her aware it is at store &gave her phone # to customer service.  Nothing further needed.

## 2020-04-01 ENCOUNTER — Encounter: Payer: Self-pay | Admitting: Internal Medicine

## 2020-04-02 ENCOUNTER — Other Ambulatory Visit: Payer: Self-pay | Admitting: Student

## 2020-04-02 MED ORDER — DICLOFENAC SODIUM 1 % EX GEL
2.0000 g | Freq: Four times a day (QID) | CUTANEOUS | 3 refills | Status: DC
Start: 2020-04-02 — End: 2021-03-11

## 2020-04-23 ENCOUNTER — Other Ambulatory Visit: Payer: Self-pay | Admitting: Internal Medicine

## 2020-05-02 ENCOUNTER — Telehealth: Payer: Self-pay | Admitting: Internal Medicine

## 2020-05-02 NOTE — Telephone Encounter (Signed)
Patient called regarding frequent nose bleeds and rash. She states this has been ongoing for a few weeks. She tried calling the [facility] center today to schedule an appointment however was unable to get in touch with anyone. She states her nose bleeds started about 3 weeks ago and have gotten severely worse. She states the bleeding has progressively gotten heavier. She denies any lightheadedness, dizziness or syncopal episodes. She also endorses a rash on the side of her neck that itches. She has been taking benadryl po but that has not helped much. She denies any recent changes in her medications.   Will message the front desk to schedule patient for an appointment tomorrow.

## 2020-05-03 ENCOUNTER — Telehealth: Payer: Self-pay | Admitting: *Deleted

## 2020-05-03 NOTE — Telephone Encounter (Signed)
-----   Message from Lianne Bushy sent at 05/03/2020 10:01 AM EST ----- Please call patient.  No openings for today.  Thanks,  [place] ----- Message ----- From: Jaci Standard, DO Sent: 05/02/2020   7:23 PM EST To: Imp Location manager  Patient is requesting an appointment tomorrow for excessive nose bleeding and rash. Thanks!

## 2020-05-03 NOTE — Telephone Encounter (Signed)
No available appts today Called pt - she stated she has been having nosebleeds and sore in her mouth after she had covid last year. Stated she had talked to the doctor last night about this (see Dr Patty Sermons note) . Appt scheduled Monday 3/14 @ 1015 AM

## 2020-05-06 ENCOUNTER — Ambulatory Visit (INDEPENDENT_AMBULATORY_CARE_PROVIDER_SITE_OTHER): Payer: Medicaid Other | Admitting: Student

## 2020-05-06 ENCOUNTER — Other Ambulatory Visit: Payer: Self-pay

## 2020-05-06 ENCOUNTER — Encounter: Payer: Self-pay | Admitting: Student

## 2020-05-06 DIAGNOSIS — K1379 Other lesions of oral mucosa: Secondary | ICD-10-CM

## 2020-05-06 DIAGNOSIS — R040 Epistaxis: Secondary | ICD-10-CM | POA: Insufficient documentation

## 2020-05-06 DIAGNOSIS — R21 Rash and other nonspecific skin eruption: Secondary | ICD-10-CM | POA: Diagnosis not present

## 2020-05-06 HISTORY — DX: Epistaxis: R04.0

## 2020-05-06 HISTORY — DX: Other lesions of oral mucosa: K13.79

## 2020-05-06 HISTORY — DX: Rash and other nonspecific skin eruption: R21

## 2020-05-06 NOTE — Progress Notes (Signed)
  [facility] Internal Medicine Residency Telephone Encounter Continuity Care Appointment  HPI:   This telephone encounter was created for [name] on 05/06/2020 for the following purpose/cc: nose bleeds, rash on neck, canker sore   Past Medical History:  Past Medical History:  Diagnosis Date  . Carpal tunnel syndrome    right  . 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 Family history of prostate cancer   . GERD (gastroesophageal reflux disease)   . Headache(784.0)    migraines or sinus  . History of vertigo    states comes and goes; no current med.  . Obesity   . Osteoarthritis   . PONV (postoperative nausea and vomiting)   . Rash 08/10/2013   right thigh  . Sleep apnea    does not wear cpap      ROS:  Review of Systems  Constitutional: Negative for chills and fever.  HENT: Positive for nosebleeds. Negative for congestion and sore throat.   Respiratory: Negative for shortness of breath.   Cardiovascular: Negative for chest pain and palpitations.  Gastrointestinal: Negative for nausea and vomiting.  Musculoskeletal: Negative for myalgias.  Neurological: Positive for dizziness. Negative for weakness and headaches.     Assessment / Plan / Recommendations:   Please see A&P under problem oriented charting for assessment of the patient's acute and chronic medical conditions.   As always, pt is advised that if symptoms worsen or new symptoms arise, they should go to an urgent care facility or to to ER for further evaluation.   Consent and Medical Decision Making:   Patient discussed with Dr. Dareen Piano  This is a telephone encounter between [facility] and Dawn Galvan on 05/06/2020 for the chief complaints as listed above. The visit was conducted with the patient located at home and Dawn Galvan at [facility]. The patient's identity was confirmed using their DOB and current address. The patient  has consented to being evaluated through a telephone encounter and understands the associated risks (an examination cannot be done and the patient may need to come in for an appointment) / benefits (allows the patient to remain at home, decreasing exposure to coronavirus). I personally spent 15 minutes on medical discussion.

## 2020-05-06 NOTE — Assessment & Plan Note (Signed)
Patient originally scheduled for an in-person evaluation however switched to a telehealth appointment per the patient's request given a commitment regarding her son.  Dawn Galvan reports she has been having intermittent nose bleeds for the past 2-3 weeks. She states she was not doing anything particular when she had the first nose bleed, and since then, she has had daily nose bleeds, sometimes once, twice, or up to three times daily. Reports bleeding only comes from her L nostril. She has tried applying pressure to the tip of her nose and leaning her head back, however the only thing which seems to help stop the bleeding is putting a tissue up the nostril. States applying pressure "makes it bleed more." The bleeding started as clots and has since "thinned out." She reports she never had nose bleeds until she was hospitalized for COVID in August 2021 and required supplemental oxygen, which she was discharged home on but came off of in September 2021. Endorses mild dizziness and lightheadedness, however these symptoms have been ongoing since her COVID infection. She stopped using her fluticasone thinking it may be contributing but has not noticed a change. Denies personal or family history of bleeding disorders.   Assessment/Plan: Patient with a 2-3 week history of frequent epistaxis. Discussed that ideally we would evaluate her in person so that we can examine the nose for friable/unhealing tissue. Patient states she is available for an in-person appointment in 3 days. Discussed that the supplemental oxygen she was placed on for COVID likely dried out her nasal mucosa, which can predispose to epistaxis, and likely the dry winter weather has exacerbated it again. Counseled patient that with onset of bleeding to apply pressure for at least 10 minutes and not to tilt head back. If bleeding restarts, may use Afrin twice daily for 3 days max. Encouraged use of humidifier and application of small amount of Vaseline in  nose. Encouraged her to avoid touching her nose.  - Conservative management discussed as above - Patient to be scheduled for in-person evaluation in 3 days per her availability - May require referral to ENT

## 2020-05-06 NOTE — Assessment & Plan Note (Signed)
(  Telehealth). Patient reports she has a "canker sore" in her mouth which recently improved with use of an Oral B canker sore mouth rinse. She reports she had "gum swelling" during her Fall 2021 hospitalization for COVID and has not seen a dentist "in a minute."  Plan: - continue mouth rinse - counseled patient to continue her multivitamin - counseled to avoid spicy or acidic foods - will examine at upcoming in-person visit

## 2020-05-06 NOTE — Assessment & Plan Note (Signed)
(  Telehealth) Patient reports she developed a rash on one side of her neck which has improved with application of a relative's eczema cream. Counseled patient on limitations of phone visit to address skin complaint. Will address at upcoming in-person evaluation. Recommended continued use of eczema cream for now.

## 2020-05-08 ENCOUNTER — Telehealth: Payer: Self-pay

## 2020-05-08 NOTE — Telephone Encounter (Signed)
PT is requesting a call back about her appt that was for 05/09/20/ @1 :45 ... when I called to remind her of the appt she stated that was not the time she was given and it is to be at 11am  All the slots were filled so I could not offer  Earlier I tried the next day she stated no .. and requested a call back to fit her in earlier

## 2020-05-08 NOTE — Progress Notes (Signed)
Internal Medicine Clinic Attending  Case discussed with Dr. Watson  At the time of the visit.  We reviewed the resident's history and exam and pertinent patient test results.  I agree with the assessment, diagnosis, and plan of care documented in the resident's note.  

## 2020-05-08 NOTE — Telephone Encounter (Signed)
Returned call to patient. Explained there are no available appts tomorrow AM. 1:45 is no longer available either. First available appt is at 2:15. Patient states this is not what she and the doctor dicussed at her tele appt. States the doctor told her she could come in tomorrow at 1100 and someone from [facility] would call her back immediately to confirm this. States, "the communication in your office is a big problem and you need to get your act together." Patient was asked if she would like an appt on AM of 05/10/2020. Patient declined. 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 Patient took this RN's name and requests phone call from Print production planner tomorrow.

## 2020-05-08 NOTE — Telephone Encounter (Signed)
Thank you for returning her call. During our telehealth appointment, I told the patient I may be available around 11 am, however someone from the front desk would call to confirm. At the time I forgot I have panel management during that time, and the front desk notified me that the patient would be scheduled at the 1:45 pm slot. I did not guarantee the 11 am appointment time, but it appears the patient assumed this would happen. I hope this is helpful for the discussion with the patient tomorrow.

## 2020-05-08 NOTE — Telephone Encounter (Signed)
This was addressed in separate encounter from today.

## 2020-05-08 NOTE — Telephone Encounter (Signed)
Pt called again about been fit into an earlier slot tomorrow

## 2020-05-09 ENCOUNTER — Telehealth: Payer: Self-pay | Admitting: Internal Medicine

## 2020-05-09 ENCOUNTER — Encounter: Payer: Medicaid Other | Admitting: Student

## 2020-05-09 NOTE — Telephone Encounter (Signed)
  [facility] Residency Telephone Encounter Continuity Care Appointment  HPI:   This telephone encounter was created for [name] on 05/09/2020 for the following purpose/cc epistaxis.   Past Medical History:  Past Medical History:  Diagnosis Date  . Carpal tunnel syndrome    right  . 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 Family history of prostate cancer   . GERD (gastroesophageal reflux disease)   . Headache(784.0)    migraines or sinus  . History of vertigo    states comes and goes; no current med.  . Obesity   . Osteoarthritis   . PONV (postoperative nausea and vomiting)   . Rash 08/10/2013   right thigh  . Sleep apnea    does not wear cpap      ROS:   Negative except as stated in HPI.   Assessment / Plan / Recommendations:   Please see A&P under problem oriented charting for assessment of the patient's acute and chronic medical conditions.   As always, pt is advised that if symptoms worsen or new symptoms arise, they should go to an urgent care facility or to to ER for further evaluation.   Consent and Medical Decision Making:   Patient discussed with Dr. Rebeca Alert  This is a telephone encounter between [place] and [place] on 05/09/2020 for epistaxis. The visit was conducted with the patient located at home and Harvie Heck at [facility]. The patient's identity was confirmed using their DOB and current address. The patient has consented to being evaluated through a telephone encounter and understands the associated risks (an examination cannot be done and the patient may need to come in for an appointment) / benefits (allows the patient to remain at home, decreasing exposure to coronavirus). I personally spent 10 minutes on medical discussion.    Ms Dawn Galvan is a 45 year old female with PMHx OSA with recent evaluation for epistaxis via telephone encounter on  3/14 called to further discuss her epistaxis. Patient notes that overnight she had an episode of epistaxis with "blood everywhere" while sleeping. She awoke from sleep with hemoptysis secondary to her epistaxis. She notes that her episode lasted for few minutes but there was blood on her pillows, sheets and on the floor. She was unable to return to sleep for fear of similar episodes recurring. She notes feeling tired all day due to not being able to get enough sleep.  Patient recounts issues with getting an appointment to be seen in [facility] today. No further episodes of epistaxis today. She is expressing concerns for repeat episode of epistaxis while sleeping tonight. Advised patient to continue using humidifier overnight and Afrin twice daily for up to three days. Advised that if she has any further episodes of large volume epistaxis, to seek medical attention. She may need ENT referral if no improvement in symptoms with conservative management.

## 2020-05-20 ENCOUNTER — Telehealth: Payer: Self-pay | Admitting: *Deleted

## 2020-05-20 NOTE — Telephone Encounter (Signed)
Returned patient's call regarding her complaint of an appointment. Lauren's note documented the conversation with the patient.  Patient returned my call today and stated we have really gotten on her nerve. The Doctor definitely told her the time was 11 AM and we never called to confirm and we were rude to her when she called back in. I tried to explain to the patient that it was a miscommunication in that the provider thought she had an 11 am open slot but it was actually in the PM. She then asked me if I thought she was retarded or something. At that point she started yelling, stating we were so unprofessional and she could not breathe and slammed the phone down.

## 2020-05-23 NOTE — Telephone Encounter (Signed)
Absolutely not the first time patient has shown this behavior - just need Wellsite geologist (and PCP? ) approval to send behavior warning letter - glad to help review/edit if helpful. Thanks, Dorie Rank, RN, 05/23/20, 2:13 P

## 2020-05-23 NOTE — Telephone Encounter (Signed)
Dr. Chesley Mires - - can you look at this and see if you think a warning letter is appropriate?  I can take care of it.

## 2020-05-23 NOTE — Telephone Encounter (Signed)
Should we send a note about appropriate behavior?  Warning letter?

## 2020-05-24 ENCOUNTER — Other Ambulatory Visit: Payer: Self-pay | Admitting: Primary Care

## 2020-05-24 NOTE — Telephone Encounter (Signed)
I agree a warning letter is appropriate.   Thanks!

## 2020-05-30 ENCOUNTER — Encounter: Payer: Self-pay | Admitting: Internal Medicine

## 2020-05-30 NOTE — Progress Notes (Signed)
error 

## 2020-06-10 ENCOUNTER — Encounter: Payer: Self-pay | Admitting: Internal Medicine

## 2020-06-14 ENCOUNTER — Other Ambulatory Visit: Payer: Self-pay | Admitting: Student

## 2020-06-17 ENCOUNTER — Other Ambulatory Visit: Payer: Self-pay | Admitting: Primary Care

## 2020-06-17 ENCOUNTER — Encounter: Payer: Self-pay | Admitting: Internal Medicine

## 2020-06-18 ENCOUNTER — Encounter: Payer: Self-pay | Admitting: *Deleted

## 2020-06-18 ENCOUNTER — Telehealth: Payer: Self-pay | Admitting: *Deleted

## 2020-06-18 NOTE — Telephone Encounter (Signed)
Pt called clinic approximately 2PM and spoke to Research officer, political party and then Clinic Director for approximately 20 minutes, reiterating multiple times that she did not want to receive a letter from the clinic about any behavior concerns on her part, that she demanded a discussion about all her perceptions of care she has received and/or wanted to receive and to discuss her perceptions of past conversations with nurses, drs, the PA and other topics, and did not need a letter threatening her about continuing to come to the [facility], that she knew she could go to a different dr and did not need the [facility] to tell her she could choose another dr and she was perfectly capable of finding her own dr. She repeated these same phrases multiple times, adding that the mailman rang her doorbell to give her the certified letter and it was the clinic's fault her dog barked at the [facility] and she worked from home and the barking was an issue bc she could have been fired and therefore the clinic should apologize for causing the disruption and possibly costing her, her job bc the dog barked while she was working. Whenever the PA or the Director tried to respond to the pt's questions or comments, the pt continued to speak through/over their attempts to respond. The director repeated the pt was certainly entitled to her opinions and perceptions and the [facility] letter was not a threat, simply a way to communicate clear expectations of her behavior and the consequences of her choosing not to change her approach in talking to staff, physicians. She was clearly entitled to choose any other health care delivery clinic and physician she would like who could provide her the care in the way/fashion she perceived to be best for her. Pt repeated she could choose her own dr and did not need to be told she could or who she could see.  Patient talked through the director's responses without stopping. The director ended the call stating the [facility] letter was  clear in the description of the nature of the  communications and behavior that would be acceptable from her, since she wanted to "hear" directly and not just be told in the letter. The director requested confirmation the pt could hear and understand these expectations and then ended the call. Approximately 15 minutes later, the patient called back, leaving a message, stating she wanted to continue to get her medical care at the [facility] until she could find a dr who would take her as a pt based on her income. She did not ask for an appt at the time, nor did she ask for a recommendation for another physician/practice. No response was therefore noted as needed to this message. Dorie Rank, RN, Dept Director, 2:59P

## 2020-06-30 ENCOUNTER — Other Ambulatory Visit: Payer: Self-pay | Admitting: Oncology

## 2020-07-01 ENCOUNTER — Telehealth: Payer: Self-pay | Admitting: Oncology

## 2020-07-01 NOTE — Telephone Encounter (Signed)
R/s appt per 5/8 sch msg. Called pt, no answer. Left msg with appt date and time.  

## 2020-07-02 ENCOUNTER — Ambulatory Visit (HOSPITAL_BASED_OUTPATIENT_CLINIC_OR_DEPARTMENT_OTHER): Payer: Medicaid Other | Admitting: Nurse Practitioner

## 2020-07-02 ENCOUNTER — Encounter (HOSPITAL_BASED_OUTPATIENT_CLINIC_OR_DEPARTMENT_OTHER): Payer: Self-pay | Admitting: Nurse Practitioner

## 2020-07-16 ENCOUNTER — Ambulatory Visit (HOSPITAL_BASED_OUTPATIENT_CLINIC_OR_DEPARTMENT_OTHER): Payer: Medicaid Other | Admitting: Nurse Practitioner

## 2020-08-05 ENCOUNTER — Encounter (HOSPITAL_BASED_OUTPATIENT_CLINIC_OR_DEPARTMENT_OTHER): Payer: Self-pay

## 2020-08-05 ENCOUNTER — Telehealth (HOSPITAL_BASED_OUTPATIENT_CLINIC_OR_DEPARTMENT_OTHER): Payer: Self-pay

## 2020-08-05 ENCOUNTER — Other Ambulatory Visit (HOSPITAL_BASED_OUTPATIENT_CLINIC_OR_DEPARTMENT_OTHER): Payer: Self-pay

## 2020-08-05 ENCOUNTER — Emergency Department (HOSPITAL_BASED_OUTPATIENT_CLINIC_OR_DEPARTMENT_OTHER)
Admission: EM | Admit: 2020-08-05 | Discharge: 2020-08-05 | Disposition: A | Discharge status: Home / Self Care | Payer: Medicaid Other | Attending: Emergency Medicine | Admitting: Emergency Medicine

## 2020-08-05 ENCOUNTER — Encounter (HOSPITAL_BASED_OUTPATIENT_CLINIC_OR_DEPARTMENT_OTHER): Payer: Self-pay | Admitting: Emergency Medicine

## 2020-08-05 ENCOUNTER — Other Ambulatory Visit: Payer: Self-pay

## 2020-08-05 ENCOUNTER — Ambulatory Visit (HOSPITAL_BASED_OUTPATIENT_CLINIC_OR_DEPARTMENT_OTHER): Payer: Medicaid Other | Admitting: Nurse Practitioner

## 2020-08-05 DIAGNOSIS — R0789 Other chest pain: Secondary | ICD-10-CM | POA: Insufficient documentation

## 2020-08-05 DIAGNOSIS — Z8616 Personal history of COVID-19: Secondary | ICD-10-CM | POA: Diagnosis not present

## 2020-08-05 DIAGNOSIS — R070 Pain in throat: Secondary | ICD-10-CM | POA: Insufficient documentation

## 2020-08-05 DIAGNOSIS — R0602 Shortness of breath: Secondary | ICD-10-CM | POA: Insufficient documentation

## 2020-08-05 DIAGNOSIS — Z87891 Personal history of nicotine dependence: Secondary | ICD-10-CM | POA: Insufficient documentation

## 2020-08-05 LAB — GROUP A STREP BY PCR: Group A Strep by PCR: NOT DETECTED

## 2020-08-05 MED ORDER — OMEPRAZOLE 20 MG PO CPDR
20.0000 mg | DELAYED_RELEASE_CAPSULE | Freq: Every day | ORAL | 1 refills | Status: DC
  Filled 2020-08-05: qty 30, 30d supply, fill #0

## 2020-08-05 MED ORDER — OMEPRAZOLE 20 MG PO CPDR: 20.0000 mg | DELAYED_RELEASE_CAPSULE | Freq: Every day | ORAL | 1 refills | Status: DC

## 2020-08-05 MED ORDER — COVID-19 AT HOME ANTIGEN TEST VI KIT
PACK | 0 refills | Status: DC
  Filled 2020-08-05: qty 1, 2d supply, fill #0

## 2020-08-05 NOTE — ED Notes (Signed)
ED Provider at bedside. 

## 2020-08-05 NOTE — ED Triage Notes (Signed)
Pt via pov from home with sore throat that has been intermittent for a year, as well as esophageal burning x 2 weeks. Pt had an appointment at primary care and was turned away because she was late. Pt alert & oriented, nad noted.

## 2020-08-05 NOTE — Discharge Instructions (Addendum)
You were seen in the emergency department for some throat discomfort and burning chest pain.  This is likely reflux.  We are prescribing some acid medication.  You can also do Maalox in between meals and at bedtime.  Please contact your primary care doctor for follow-up.  Return to the emergency department if any worsening or concerning symptoms
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
## 2020-08-05 NOTE — ED Provider Notes (Signed)
[facility] EMERGENCY DEPT Provider Note   CSN: [id number] Arrival date & time: 08/05/20  1057     History Chief Complaint  Patient presents with   Sore Throat    Dawn Galvan is a 45 y.o. female.  She is complaining of some throat pain that is worsened over the last month.  Sometimes when she swallows sometimes when she talks.  She thinks her pulmonary inhalers sometimes make it better and also cold liquids.  She is also noticed some burning discomfort in her chest.  She tried Rolaids without any improvement.  She saw her pulmonologist after she got COVID and she is on some inhalers.  The history is provided by the patient.  Sore Throat This is a recurrent problem. The current episode started more than 1 week ago. The problem occurs daily. The problem has not changed since onset.Associated symptoms include shortness of breath. Pertinent negatives include no chest pain, no abdominal pain and no headaches. The symptoms are aggravated by swallowing. Nothing relieves the symptoms. She has tried water for the symptoms. The treatment provided mild relief.      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   Morbid obesity with BMI of 45.0-49.9, adult ([place]) 02/07/2014   Obesity    Osteoarthritis    Pneumonia due to COVID-19 virus 10/16/2019   Formatting of this note might be different from the original. Last Assessment & Plan:  Formatting of this note might be different from the original. Patient seen via telehealth visit  for COVID-19 infection. Comorbid obesity and pre-DM. Reports symptoms for the past week, and positive test 3 days ago. 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   Pulmonary nodules 12/27/2019   Hyperlipidemia with aortic atherosclerosis CT scan 12/27/2019   Generalized anxiety disorder 11/10/2019   Nexplanon in place 05/29/2019   Tonsillar cyst 05/22/2019   Breast hypertrophy in female 03/29/2019   At high risk for breast cancer 10/05/2018   Chronic hip pain, right 14/48/1856   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   Genetic testing 09/10/2017   Family history of breast cancer    Family history of prostate cancer    Laryngopharyngeal reflux (LPR) 07/14/2016   S/P bariatric surgery 09/04/2015   Depression 08/05/2015   Sleep apnea, obstructive 11/11/2014   Prediabetes 06/08/2014   Morbid obesity ([place]) 06/12/2011    Past Surgical History:  Procedure Laterality Date   CARPAL TUNNEL RELEASE Left 08/18/2013   Procedure: LEFT CARPAL TUNNEL RELEASE;  Surgeon: Tennis Must,  MD;  Location: [facility];  Service: Orthopedics;  Laterality: Left;   CARPAL TUNNEL RELEASE Right 04/29/2017   Procedure: RIGHT CARPAL TUNNEL RELEASE;  Surgeon: Leanora Cover, MD;  Location: [facility];  Service: Orthopedics;  Laterality: Right;   CESAREAN SECTION     CHOLECYSTECTOMY     HERNIA REPAIR     x 2   HYSTEROPLASTY REPAIR OF UTERINE ANOMALY     HYSTEROSCOPY WITH D & C  01/13/2012   Procedure: DILATATION AND CURETTAGE /HYSTEROSCOPY;  Surgeon: Osborne Oman, MD;  Location: [facility];  Service: Gynecology;  Laterality: N/A;  Pap smear    LAPAROSCOPIC GASTRIC SLEEVE RESECTION  2016   TOE SURGERY Left    second toe. rod placed   WISDOM TOOTH EXTRACTION  11/20/10     OB History     Gravida  2   Para  2   Term  2   Preterm  0   AB  0   Living  2      SAB  0   IAB  0   Ectopic  0   Multiple  0   Live Births              Family History  Problem Relation Age of Onset   Hypertension Mother    Breast cancer Mother    Hypertension Father    Diabetes Maternal Grandmother    Diabetes Paternal Grandmother     Social History   Tobacco Use   Smoking status: Former    Years: 0.00    Pack years: 0.00    Types: Cigarettes    Quit date: 02/22/2009    Years since quitting: 11.4   Smokeless tobacco: Never  Vaping Use   Vaping Use: Never used  Substance Use Topics   Alcohol use: Yes    Comment: Social   Drug use: No    Home Medications Prior to Admission medications   Medication Sig Start Date End Date Taking? Authorizing Provider  diclofenac Sodium (VOLTAREN) 1 % GEL Apply 2 g topically 4 (four) times daily. 04/02/20  Yes Andrew Au, MD  fluticasone (FLONASE) 50 MCG/ACT nasal spray SPRAY 1 SPRAY INTO BOTH NOSTRILS DAILY. 06/17/20  Yes Mannam, Praveen, MD  ibuprofen (ADVIL) 600 MG tablet Take 1 tablet (600 mg total) by mouth every 6 (six) hours as needed. 10/29/19  Yes Faustino Congress, NP  rosuvastatin (CRESTOR) 10 MG tablet TAKE 1 TABLET BY MOUTH EVERY DAY 06/14/20  Yes Iona Beard, MD  sertraline (ZOLOFT) 50 MG tablet TAKE 1 TABLET BY MOUTH EVERY DAY 04/23/20  Yes Christian, Rylee, MD  Grant Ruts INHUB 250-50 MCG/DOSE AEPB INHALE 1 PUFF INTO THE LUNGS TWICE A DAY 05/24/20  Yes Mannam, Praveen, MD  acetaminophen (TYLENOL 8 HOUR) 650 MG CR tablet Take 1 tablet (650 mg total) by mouth every 8 (eight) hours as needed for pain. 10/29/19   Faustino Congress, NP  hydrocortisone cream 1 % Apply to affected area 2 times daily 10/29/19   Faustino Congress, NP  LORazepam (ATIVAN) 1 MG tablet Take 1 tablet  (1 mg total) by mouth as needed for anxiety (before procedure). 01/22/20   Magrinat, Virgie Dad, MD  ondansetron (ZOFRAN-ODT) 8 MG disintegrating tablet Take 8 mg by mouth 3 (three) times daily as needed for nausea or vomiting (DISSOLVE ORALLY).  10/13/19   [provider]  PHENTERMINE HCL PO Take by mouth.    [provider]  topiramate (TOPAMAX) 25 MG tablet Take 25 mg  by mouth 2 (two) times daily.    [provider]    Allergies    Doxycycline and Other  Review of Systems   Review of Systems  Constitutional:  Negative for fever.  HENT:  Positive for rhinorrhea and sore throat.   Eyes:  Negative for visual disturbance.  Respiratory:  Positive for shortness of breath.   Cardiovascular:  Negative for chest pain.  Gastrointestinal:  Negative for abdominal pain.  Musculoskeletal:  Positive for neck pain.  Neurological:  Negative for headaches.   Physical Exam Updated Vital Signs BP 119/67 (BP Location: Right Wrist)   Pulse 85   Resp 16   Ht 5' 3.5" (1.613 m)   Wt 124.7 kg   SpO2 100%   BMI 47.95 kg/m   Physical Exam Constitutional:      Appearance: She is well-developed.  HENT:     Head: Normocephalic and atraumatic.     Mouth/Throat:     Mouth: Mucous membranes are moist.     Pharynx: Oropharynx is clear. No oropharyngeal exudate or posterior oropharyngeal erythema.     Tonsils: No tonsillar exudate or tonsillar abscesses.  Eyes:     Conjunctiva/sclera: Conjunctivae normal.  Pulmonary:     Effort: Pulmonary effort is normal.     Breath sounds: No wheezing or rhonchi.  Musculoskeletal:     Cervical back: Normal range of motion and neck supple.  Skin:    General: Skin is warm and dry.  Neurological:     Mental Status: She is alert.     GCS: GCS eye subscore is 4. GCS verbal subscore is 5. GCS motor subscore is 6.    ED Results / Procedures / Treatments   Labs (all labs ordered are listed, but only abnormal results are displayed) Labs  Reviewed  GROUP A STREP BY PCR    EKG None  Radiology No results found.  Procedures Procedures   Medications Ordered in ED Medications - No data to display  ED Course  I have reviewed the triage vital signs and the nursing notes.  Pertinent labs & imaging results that were available during my care of the patient were reviewed by me and considered in my medical decision making (see chart for details).    MDM Rules/Calculators/A&P                         45 year old female here with pain in her throat and some intermittent burning in her chest that is been going on for over a month.  She is very well-appearing satting 100% on room air.  Strep test negative.  Will place on PPI and have follow-up with PCP.  Return instructions discussed.  Final Clinical Impression(s) / ED Diagnoses Final diagnoses:  Throat pain in adult    Rx / DC Orders ED Discharge Orders          Ordered    omeprazole (PRILOSEC) 20 MG capsule  Daily,   Status:  Discontinued        08/05/20 1221    omeprazole (PRILOSEC) 20 MG capsule  Daily,   Status:  Discontinued        08/05/20 1224    omeprazole (PRILOSEC) 20 MG capsule  Daily        08/05/20 1227             Hayden Rasmussen, MD 08/05/20 1730

## 2020-08-06 ENCOUNTER — Telehealth: Payer: Self-pay | Admitting: *Deleted

## 2020-08-06 NOTE — Telephone Encounter (Signed)
Transition Care Management Follow-up Telephone Call Date of discharge and from where: 08/05/2020 - [facility] How have you been since you were released from the hospital? "Alright" Any questions or concerns? No  Items Reviewed: Did the pt receive and understand the discharge instructions provided? Yes  Medications obtained and verified? Yes  Other? No  Any new allergies since your discharge? No  Dietary orders reviewed? No Do you have support at home? No    Functional Questionnaire: (I = Independent and D = Dependent) ADLs: I  Bathing/Dressing- I  Meal Prep- I  Eating- I  Maintaining continence- I  Transferring/Ambulation- I  Managing Meds- I  Follow up appointments reviewed:  PCP Hospital f/u appt confirmed? No   Specialist Hospital f/u appt confirmed? No   Are transportation arrangements needed? No  If their condition worsens, is the pt aware to call PCP or go to the Emergency Dept.? Yes Was the patient provided with contact information for the PCP's office or ED? Yes Was to pt encouraged to call back with questions or concerns? Yes

## 2020-08-06 NOTE — Telephone Encounter (Signed)
Pt came for appt 11 minutes late. Was not pleased we could not see her today. We gave her the option of going to ED or rescheduling. She decided to leave instead. I hope she is feeling better.

## 2020-08-30 ENCOUNTER — Other Ambulatory Visit: Payer: Self-pay | Admitting: Pulmonary Disease

## 2020-09-09 ENCOUNTER — Ambulatory Visit: Payer: Medicaid Other | Admitting: Oncology

## 2020-09-17 ENCOUNTER — Encounter: Payer: Self-pay | Admitting: Oncology

## 2020-09-18 ENCOUNTER — Other Ambulatory Visit: Payer: Self-pay | Admitting: Oncology

## 2020-09-18 DIAGNOSIS — Z1231 Encounter for screening mammogram for malignant neoplasm of breast: Secondary | ICD-10-CM

## 2020-09-19 ENCOUNTER — Other Ambulatory Visit: Payer: Self-pay

## 2020-09-19 ENCOUNTER — Ambulatory Visit: Payer: Medicaid Other

## 2020-09-19 ENCOUNTER — Ambulatory Visit
Admission: RE | Admit: 2020-09-19 | Discharge: 2020-09-19 | Disposition: A | Discharge status: Home / Self Care | Payer: Medicaid Other | Source: Ambulatory Visit | Attending: Oncology | Admitting: Oncology

## 2020-09-19 DIAGNOSIS — Z1231 Encounter for screening mammogram for malignant neoplasm of breast: Secondary | ICD-10-CM

## 2020-09-23 DIAGNOSIS — Z13228 Encounter for screening for other metabolic disorders: Secondary | ICD-10-CM | POA: Diagnosis not present

## 2020-09-23 DIAGNOSIS — Z6841 Body Mass Index (BMI) 40.0 and over, adult: Secondary | ICD-10-CM | POA: Diagnosis not present

## 2020-09-23 DIAGNOSIS — F32A Depression, unspecified: Secondary | ICD-10-CM | POA: Diagnosis not present

## 2020-09-23 DIAGNOSIS — E668 Other obesity: Secondary | ICD-10-CM | POA: Diagnosis not present

## 2020-09-23 DIAGNOSIS — G4733 Obstructive sleep apnea (adult) (pediatric): Secondary | ICD-10-CM | POA: Diagnosis not present

## 2020-09-25 ENCOUNTER — Encounter: Payer: Self-pay | Admitting: Oncology

## 2020-09-25 ENCOUNTER — Inpatient Hospital Stay: Payer: Medicaid Other | Attending: Oncology | Admitting: Oncology

## 2020-09-25 NOTE — Progress Notes (Signed)
[place]  [phone] [phone]    ID: Dawn Galvan DOB: [birth year]  MR#: [id number]  AUQ#:[id number]  Patient Care Team: Pcp, No as PCP - General Jayleon Mcfarlane, Virgie Dad, MD as Consulting Physician (Oncology) OTHER MD:   CHIEF COMPLAINT: PALB 2 Mutation, morbid obesity  CURRENT TREATMENT: intensified screening while definitive surgery pending   INTERVAL HISTORY: Dawn Galvan was scheduled today for follow up of her PALB2 mutation.  However, she did not show.  Since her last visit, she underwent breast MRI on 01/24/2020 showing: breast composition A; two adjacent indeterminate 5 mm enhancing masses within inferior right breast; suggestion of at least 1 cortically thickened right axillary lymph node.  She returned for biopsy of the areas on 02/02/2020. Breast MRI performed at that time did not definitely show the previously seen masses. Repeat breast MRI was recommended in 6 months.  She also underwent bilateral screening mammography with tomography at [facility] on 09/19/2020 showing: breast density category A; no evidence of malignancy in either breast.    REVIEW OF SYSTEMS: Dawn Galvan    COVID 19 VACCINATION STATUS: infection 09/2019; [place] x2 in 01/2020   HISTORY OF CURRENT ILLNESS: From the original intake note:  Dawn Galvan is a 45 y.o. 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 She was originally under the care of Dr. Lindi Adie, but the patient requested a second opinion. Dawn Galvan's mother, [name], was also diagnosed with breast cancer related to PALB 2 mutation.  [name] opted for intensified screening but passed on antiestrogens. Because of this history, Dawn Galvan also underwent genetic testing and it returned positive for the PALB 2 mutation in July 2019.  She also decided against antiestrogens.   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   Morbid obesity with BMI of 45.0-49.9, adult ([place]) 02/07/2014   Obesity    Osteoarthritis    Pneumonia due to COVID-19 virus 10/16/2019   Formatting of this note might be different from the original. Last Assessment & Plan:  Formatting of this note might be different from the original. Patient seen via telehealth visit for COVID-19 infection. Comorbid obesity and pre-DM. Reports symptoms for the past week, and positive test 3 days ago. Complains of persistent fever with Tmax of 100.7, chills, mild SOB, body aches which are improving   PONV (postoperative nausea and vomiting)    Rash 08/10/2013   right thigh   Rash of neck 05/06/2020   Sleep apnea    does not wear cpap   Sore in mouth 05/06/2020   Sore throat 05/22/2019    PAST SURGICAL HISTORY: Past Surgical History:  Procedure Laterality Date   CARPAL TUNNEL RELEASE Left 08/18/2013   Procedure: LEFT CARPAL TUNNEL RELEASE;  Surgeon: Tennis Must, MD;  Location: [facility];  Service: Orthopedics;  Laterality: Left;   CARPAL TUNNEL RELEASE Right 04/29/2017   Procedure: RIGHT CARPAL TUNNEL RELEASE;  Surgeon: Fredna Dow,  Lennette Bihari, MD;  Location: [facility];  Service: Orthopedics;  Laterality: Right;   CESAREAN SECTION     CHOLECYSTECTOMY     HERNIA REPAIR     x 2   HYSTEROPLASTY REPAIR OF UTERINE ANOMALY     HYSTEROSCOPY WITH D & C  01/13/2012   Procedure: DILATATION AND CURETTAGE /HYSTEROSCOPY;  Surgeon: Osborne Oman, MD;  Location: [facility];  Service: Gynecology;  Laterality: N/A;  Pap smear   LAPAROSCOPIC GASTRIC SLEEVE RESECTION  2016   TOE SURGERY  Left    second toe. rod placed   WISDOM TOOTH EXTRACTION  11/20/10    FAMILY HISTORY: Family History  Problem Relation Age of Onset   Hypertension Mother    Breast cancer Mother    Hypertension Father    Diabetes Maternal Grandmother    Diabetes Paternal Grandmother   As of August 2020 Dawn Galvan's father is in his 43's. Patients' mother is 68: She also carries a PALB2 mutation and has been diagnosed with breast cancer. The patient has 1 brother and 2 sisters, none with cancer. Two of her maternal aunts (3 out of her mother's 8 sisters) had breast cancer. Patient denies anyone in her family having ovarian or pancreatic cancer.    GYNECOLOGIC HISTORY:  No LMP recorded. Patient has had an implant. Menarche: 45 years old Age at first live birth: 45 years old [place] P: 2 LMP: unknown, uses implant birth control Contraceptive: implanon Hysterectomy?: no BSO?: no   SOCIAL HISTORY: (Current as of 09/25/2020) Dawn Galvan is a single. 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 She has two children: Dawn Galvan and Dawn Galvan. Dawn Galvan is 57; she is a Museum/gallery exhibitions officer at [employer] Dawn Galvan is 2, in 9th grade.  The patient does not belong to a church, synagogue, or mosque.    ADVANCED DIRECTIVES: Not in place    HEALTH MAINTENANCE: Social History   Tobacco Use   Smoking status: Former    Years: 0.00    Types: Cigarettes    Quit date: 02/22/2009    Years since quitting: 11.5   Smokeless tobacco: Never  Vaping Use   Vaping Use: Never used  Substance Use Topics   Alcohol use: Yes    Comment: Social   Drug use: No    Colonoscopy: Dr. Lyndel Safe in [place]  [facility]  Bone density: none [facility]  Allergies  Allergen Reactions   Doxycycline Nausea And Vomiting    Dizziness    Other Itching and Other (See Comments)    Seasonal allergies- Itchy eyes, runny nose, etc..    Current Outpatient Medications  Medication Sig Dispense Refill    acetaminophen (TYLENOL 8 HOUR) 650 MG CR tablet Take 1 tablet (650 mg total) by mouth every 8 (eight) hours as needed for pain. 30 tablet 0   COVID-19 At Home Antigen Test KIT Use as directed 1 kit 0   diclofenac Sodium (VOLTAREN) 1 % GEL Apply 2 g topically 4 (four) times daily. 100 g 3   fluticasone (FLONASE) 50 MCG/ACT nasal spray SPRAY 1 SPRAY INTO BOTH NOSTRILS DAILY. 16 mL 2   hydrocortisone cream 1 % Apply to affected area 2 times daily 15 g 0   ibuprofen (ADVIL) 600 MG tablet Take 1 tablet (600 mg total) by mouth every 6 (six) hours as needed. 30 tablet 0   LORazepam (ATIVAN) 1 MG tablet Take 1 tablet (1 mg total) by mouth as needed for anxiety (before procedure). 12  tablet 0   omeprazole (PRILOSEC) 20 MG capsule Take 1 capsule (20 mg total) by mouth daily. 30 capsule 1   ondansetron (ZOFRAN-ODT) 8 MG disintegrating tablet Take 8 mg by mouth 3 (three) times daily as needed for nausea or vomiting (DISSOLVE ORALLY).      PHENTERMINE HCL PO Take by mouth.     rosuvastatin (CRESTOR) 10 MG tablet TAKE 1 TABLET BY MOUTH EVERY DAY 90 tablet 1   sertraline (ZOLOFT) 50 MG tablet TAKE 1 TABLET BY MOUTH EVERY DAY 90 tablet 1   topiramate (TOPAMAX) 25 MG tablet Take 25 mg by mouth 2 (two) times daily.     WIXELA INHUB 250-50 MCG/ACT AEPB INHALE 1 PUFF INTO THE LUNGS TWICE A DAY 60 each 5   No current facility-administered medications for this visit.    OBJECTIVE:   There were no vitals filed for this visit.   There is no height or weight on file to calculate BMI.   Wt Readings from Last 3 Encounters:  08/05/20 275 lb (124.7 kg)  02/07/20 287 lb 3.2 oz (130.3 kg)  01/01/20 283 lb (128.4 kg)      ECOG FS:1 - Symptomatic but completely ambulatory     LAB RESULTS:  CMP     Component Value Date/Time   NA 142 12/27/2019 1533   NA 143 02/28/2016 1117   K 4.2 12/27/2019 1533   CL 109 12/27/2019 1533   CO2 18 (L) 12/27/2019 1533   GLUCOSE 89 12/27/2019 1533   BUN 9 12/27/2019 1533    BUN 9 02/28/2016 1117   CREATININE 0.89 12/27/2019 1533   CALCIUM 10.0 12/27/2019 1533   PROT 7.4 12/27/2019 1533   ALBUMIN 4.1 12/27/2019 1533   AST 21 12/27/2019 1533   ALT 15 12/27/2019 1533   ALKPHOS 68 12/27/2019 1533   BILITOT 0.7 12/27/2019 1533   GFRNONAA >60 12/27/2019 1533   GFRAA >60 10/19/2019 1455   Lab Results  Component Value Date   WBC 6.2 10/19/2019   NEUTROABS 5.1 10/19/2019   HGB 14.9 10/19/2019   HCT 46.4 (H) 10/19/2019   MCV 91.5 10/19/2019   PLT 241 10/19/2019   No results found for: LABCA2  No components found for: [id number]  No results for input(s): INR in the last 168 hours.  No results found for: LABCA2  No results found for: IHK742  No results found for: VZD638  No results found for: VFI433  No results found for: CA2729  No components found for: HGQUANT  No results found for: CEA1 / No results found for: CEA1   No results found for: AFPTUMOR  No results found for: CHROMOGRNA  No results found for: TOTALPROTELP, ALBUMINELP, A1GS, A2GS, BETS, BETA2SER, GAMS, MSPIKE, SPEI (this displays SPEP labs)  No results found for: KPAFRELGTCHN, LAMBDASER, KAPLAMBRATIO (kappa/lambda light chains)  No results found for: HGBA, HGBA2QUANT, HGBFQUANT, HGBSQUAN (Hemoglobinopathy evaluation)   Lab Results  Component Value Date   LDH 381 (H) 10/19/2019    No results found for: IRON, TIBC, IRONPCTSAT (Iron and TIBC)  Lab Results  Component Value Date   FERRITIN 1,190 (H) 10/19/2019    Urinalysis    Component Value Date/Time   COLORURINE YELLOW 11/14/2017 [place] 12/27/2019 1520   LABSPEC >=1.030 10/29/2019 1714   PHURINE 5.5 10/29/2019 1714   GLUCOSEU Negative 12/27/2019 [place] 10/29/2019 1714   BILIRUBINUR Negative 12/27/2019 1625   BILIRUBINUR Negative 12/27/2019 [place] (A) 10/29/2019 1714  PROTEINUR Negative 12/27/2019 1625   PROTEINUR Negative 12/27/2019 1520   PROTEINUR NEGATIVE  10/29/2019 1714   UROBILINOGEN 2.0 (A) 12/27/2019 1625   UROBILINOGEN 1.0 10/29/2019 1714   NITRITE Negative 12/27/2019 1625   NITRITE Negative 12/27/2019 1520   NITRITE NEGATIVE 10/29/2019 1714   LEUKOCYTESUR Negative 12/27/2019 1625   LEUKOCYTESUR Negative 12/27/2019 1520   LEUKOCYTESUR NEGATIVE 10/29/2019 1714     STUDIES:  MM 3D SCREEN BREAST BILATERAL  Result Date: 09/21/2020 CLINICAL DATA:  Screening. EXAM: DIGITAL SCREENING BILATERAL MAMMOGRAM WITH TOMOSYNTHESIS AND CAD TECHNIQUE: Bilateral screening digital craniocaudal and mediolateral oblique mammograms were obtained. Bilateral screening digital breast tomosynthesis was performed. The images were evaluated with computer-aided detection. COMPARISON:  Previous exam(s). ACR Breast Density Category a: The breast tissue is almost entirely fatty. FINDINGS: There are no findings suspicious for malignancy. IMPRESSION: No mammographic evidence of malignancy. A result letter of this screening mammogram will be mailed directly to the patient. RECOMMENDATION: Screening mammogram in one year. (Code:SM-B-01Y) BI-RADS CATEGORY  1: Negative. Electronically Signed   By: Franki Cabot M.D.   On: 09/21/2020 07:42     ELIGIBLE FOR AVAILABLE RESEARCH PROTOCOL: no   ASSESSMENT: 45 y.o. 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   (2) patient plans to undergo bilateral prophylactic mastectomies with reconstruction, delayed until BMI decreases  (3) intensified screening: Mammography in June, breast MRI November or December   PLAN: Dawn Galvan did not show for her 09/25/2020 visit.  Follow-up letter has been sent  Jontrell Bushong, Virgie Dad, MD  09/25/20 1:31 AM Medical Oncology and Hematology [facility] [place], [place] [address] Tel. [phone]     Fax. [phone]    I, Wilburn Mylar, am acting as scribe for Dr. Virgie Dad. Keylor Rands.  I, Lurline Del MD, have reviewed the above documentation for accuracy and completeness, and I agree with the above.    *Total Encounter Time as defined by the Centers for Medicare and Medicaid Services includes, in addition to the face-to-face time of a patient visit (documented in the note above) non-face-to-face time: obtaining and reviewing outside history, ordering and reviewing medications, tests or procedures, care coordination (communications with other health care professionals or caregivers) and documentation in the medical record.

## 2020-10-13 DIAGNOSIS — F321 Major depressive disorder, single episode, moderate: Secondary | ICD-10-CM | POA: Diagnosis not present

## 2020-10-14 DIAGNOSIS — F321 Major depressive disorder, single episode, moderate: Secondary | ICD-10-CM | POA: Diagnosis not present

## 2020-10-15 DIAGNOSIS — F321 Major depressive disorder, single episode, moderate: Secondary | ICD-10-CM | POA: Diagnosis not present

## 2020-10-16 DIAGNOSIS — F321 Major depressive disorder, single episode, moderate: Secondary | ICD-10-CM | POA: Diagnosis not present

## 2020-10-18 DIAGNOSIS — F321 Major depressive disorder, single episode, moderate: Secondary | ICD-10-CM | POA: Diagnosis not present

## 2020-10-20 DIAGNOSIS — F321 Major depressive disorder, single episode, moderate: Secondary | ICD-10-CM | POA: Diagnosis not present

## 2020-10-21 DIAGNOSIS — F321 Major depressive disorder, single episode, moderate: Secondary | ICD-10-CM | POA: Diagnosis not present

## 2020-10-22 DIAGNOSIS — F321 Major depressive disorder, single episode, moderate: Secondary | ICD-10-CM | POA: Diagnosis not present

## 2020-10-23 DIAGNOSIS — F321 Major depressive disorder, single episode, moderate: Secondary | ICD-10-CM | POA: Diagnosis not present

## 2020-10-25 DIAGNOSIS — F321 Major depressive disorder, single episode, moderate: Secondary | ICD-10-CM | POA: Diagnosis not present

## 2020-10-27 DIAGNOSIS — F321 Major depressive disorder, single episode, moderate: Secondary | ICD-10-CM | POA: Diagnosis not present

## 2020-10-28 DIAGNOSIS — F321 Major depressive disorder, single episode, moderate: Secondary | ICD-10-CM | POA: Diagnosis not present

## 2020-10-29 DIAGNOSIS — F321 Major depressive disorder, single episode, moderate: Secondary | ICD-10-CM | POA: Diagnosis not present

## 2020-10-30 DIAGNOSIS — F321 Major depressive disorder, single episode, moderate: Secondary | ICD-10-CM | POA: Diagnosis not present

## 2020-11-01 DIAGNOSIS — F321 Major depressive disorder, single episode, moderate: Secondary | ICD-10-CM | POA: Diagnosis not present

## 2020-11-03 DIAGNOSIS — F321 Major depressive disorder, single episode, moderate: Secondary | ICD-10-CM | POA: Diagnosis not present

## 2020-11-04 DIAGNOSIS — F321 Major depressive disorder, single episode, moderate: Secondary | ICD-10-CM | POA: Diagnosis not present

## 2020-11-06 DIAGNOSIS — F321 Major depressive disorder, single episode, moderate: Secondary | ICD-10-CM | POA: Diagnosis not present

## 2020-12-11 ENCOUNTER — Ambulatory Visit: Payer: Medicaid Other | Admitting: Physician Assistant

## 2020-12-13 DIAGNOSIS — Z9884 Bariatric surgery status: Secondary | ICD-10-CM | POA: Diagnosis not present

## 2020-12-25 ENCOUNTER — Telehealth: Payer: Self-pay

## 2020-12-25 ENCOUNTER — Ambulatory Visit: Payer: Medicaid Other | Admitting: Physician Assistant

## 2020-12-25 ENCOUNTER — Other Ambulatory Visit: Payer: Self-pay

## 2020-12-25 ENCOUNTER — Encounter: Payer: Self-pay | Admitting: Physician Assistant

## 2020-12-25 VITALS — BP 124/84 | HR 86 | Temp 98.0°F | Ht 63.5 in | Wt 303.2 lb

## 2020-12-25 DIAGNOSIS — E7849 Other hyperlipidemia: Secondary | ICD-10-CM

## 2020-12-25 DIAGNOSIS — R413 Other amnesia: Secondary | ICD-10-CM | POA: Diagnosis not present

## 2020-12-25 DIAGNOSIS — Z79899 Other long term (current) drug therapy: Secondary | ICD-10-CM | POA: Diagnosis not present

## 2020-12-25 DIAGNOSIS — G8929 Other chronic pain: Secondary | ICD-10-CM | POA: Diagnosis not present

## 2020-12-25 DIAGNOSIS — U099 Post covid-19 condition, unspecified: Secondary | ICD-10-CM | POA: Diagnosis not present

## 2020-12-25 DIAGNOSIS — Z7689 Persons encountering health services in other specified circumstances: Secondary | ICD-10-CM

## 2020-12-25 DIAGNOSIS — M25551 Pain in right hip: Secondary | ICD-10-CM

## 2020-12-25 DIAGNOSIS — K219 Gastro-esophageal reflux disease without esophagitis: Secondary | ICD-10-CM | POA: Diagnosis not present

## 2020-12-25 MED ORDER — OMEPRAZOLE 20 MG PO CPDR: 20.0000 mg | DELAYED_RELEASE_CAPSULE | Freq: Every day | ORAL | 1 refills | Status: DC

## 2020-12-25 MED ORDER — ROSUVASTATIN CALCIUM 10 MG PO TABS
10.0000 mg | ORAL_TABLET | Freq: Every day | ORAL | 1 refills | Status: DC
Start: 2020-12-25 — End: 2021-12-19

## 2020-12-25 NOTE — Telephone Encounter (Signed)
Noted  

## 2020-12-25 NOTE — Patient Instructions (Addendum)
Good to meet you today.   Your EKG looks good. I have printed a copy for you.   I have refilled your Crestor and Prilosec.  I have sent a referral to Neurology clinic to discuss your covid-19 long-hauler memory concerns.  For your right hip - please get an XRAY at [facility]   I would like to discuss your health concerns in further detail in around 4 weeks. Come fasting and we can recheck labs, thanks!

## 2020-12-25 NOTE — Telephone Encounter (Signed)
FYI:  Patient called stating she would be late for her appt today.  States she does not do well with appts.  States she gets sick every time she has an appt.  States she only does well with virtual appts.  I have advised patient that she has a 10 minute grace period.  I have also advised patient that she will be charged a $50.00 no show fee every time she misses or arrives late.  I have also advised patient that she can get discharged from the practice after missing 2 appts.

## 2020-12-25 NOTE — Progress Notes (Signed)
Subjective:    Patient ID: Dawn Galvan, female    DOB: [birth year], 45 y.o.   MRN: [id number]  Chief Complaint  Patient presents with   Hip Pain   Medication Refill    HPI Patient is in today for new patient establishment.   Acute concerns: -Needs EKG for bariatric clinic. [facility] [place] referred her to [facility] for counseling and weight management. Started there in 2016 and had laparoscopic gastric sleeve resection. Taking Phentermine and Topamax daily.  -Also needs a few refills of some medications.  Chronic concerns: -Hx of COVID-19 in August 2021. She was hospitalized. Still having long-hauler symptoms of confusion, anxiety, brain-fog, PTSD, fatigue, occasional shortness of breath. States that this is severely affecting her ability to hold a job and keep a PCP because she is often late and forgetful.  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   Morbid obesity with BMI of 45.0-49.9, adult ([place]) 02/07/2014   Obesity    Osteoarthritis    Pneumonia due to COVID-19 virus 10/16/2019   Formatting of this note might be different from the original. Last Assessment & Plan:  Formatting of this note might be different from the original. Patient seen via telehealth visit for COVID-19 infection. Comorbid obesity and pre-DM. Reports symptoms for the past week, and positive test 3 days ago. Complains of persistent fever with Tmax of 100.7, chills, mild SOB, body aches which are improving   PONV  (postoperative nausea and vomiting)    Rash 08/10/2013   right thigh   Rash of neck 05/06/2020   Sleep apnea    does not wear cpap   Sore in mouth 05/06/2020   Sore throat 05/22/2019    Past Surgical History:  Procedure Laterality Date   CARPAL TUNNEL RELEASE Left 08/18/2013   Procedure: LEFT CARPAL TUNNEL RELEASE;  Surgeon: Tennis Must, MD;  Location: [facility];  Service: Orthopedics;  Laterality: Left;   CARPAL TUNNEL RELEASE Right 04/29/2017   Procedure: RIGHT CARPAL TUNNEL RELEASE;  Surgeon: Leanora Cover, MD;  Location: [facility];  Service: Orthopedics;  Laterality: Right;   CESAREAN SECTION     CHOLECYSTECTOMY     HERNIA REPAIR     x 2   HYSTEROPLASTY REPAIR OF UTERINE ANOMALY     HYSTEROSCOPY WITH D & C  01/13/2012   Procedure: DILATATION AND CURETTAGE /HYSTEROSCOPY;  Surgeon: Osborne Oman, MD;  Location: [facility];  Service: Gynecology;  Laterality: N/A;  Pap smear   LAPAROSCOPIC GASTRIC SLEEVE RESECTION  2016   TOE SURGERY Left    second toe. rod placed   WISDOM TOOTH EXTRACTION  11/20/10    Family History  Problem Relation Age of Onset   Hypertension Mother    Breast cancer Mother    Hypertension Father    Diabetes Maternal Grandmother    Diabetes Paternal Grandmother     Social History   Tobacco Use  Smoking status: Former    Years: 0.00    Types: Cigarettes    Quit date: 02/22/2009    Years since quitting: 11.8   Smokeless tobacco: Never  Vaping Use   Vaping Use: Never used  Substance Use Topics   Alcohol use: Yes    Comment: Social   Drug use: No     Allergies  Allergen Reactions   Doxycycline Nausea And Vomiting    Dizziness    Other Itching and Other (See Comments)    Seasonal allergies- Itchy eyes, runny nose, etc..    Review of Systems REFER TO HPI FOR PERTINENT POSITIVES AND NEGATIVES      Objective:     BP 124/84   Pulse 86   Temp 98 F (36.7 C)   Ht 5' 3.5" (1.613 m)   Wt (!) 303 lb 4 oz (137.6 kg)   SpO2 98%   BMI 52.88 kg/m    Wt Readings from Last 3 Encounters:  12/25/20 (!) 303 lb 4 oz (137.6 kg)  08/05/20 275 lb (124.7 kg)  02/07/20 287 lb 3.2 oz (130.3 kg)    BP Readings from Last 3 Encounters:  12/25/20 124/84  08/05/20 119/67  02/07/20 134/82     Physical Exam Vitals and nursing note reviewed.  Constitutional:      Appearance: Normal appearance. She is obese. She is not toxic-appearing.  HENT:     Head: Normocephalic and atraumatic.     Right Ear: External ear normal.     Left Ear: External ear normal.     Nose: Nose normal.     Mouth/Throat:     Mouth: Mucous membranes are moist.  Eyes:     Extraocular Movements: Extraocular movements intact.     Conjunctiva/sclera: Conjunctivae normal.     Pupils: Pupils are equal, round, and reactive to light.  Cardiovascular:     Rate and Rhythm: Normal rate and regular rhythm.     Pulses: Normal pulses.     Heart sounds: Normal heart sounds.  Pulmonary:     Effort: Pulmonary effort is normal.     Breath sounds: Normal breath sounds.  Abdominal:     General: Abdomen is flat. Bowel sounds are normal.     Palpations: Abdomen is soft.  Musculoskeletal:        General: Normal range of motion.     Cervical back: Normal range of motion and neck supple.  Skin:    General: Skin is warm and dry.  Neurological:     General: No focal deficit present.     Mental Status: She is alert and oriented to person, place, and time.  Psychiatric:        Mood and Affect: Affect is tearful.        Speech: Speech is tangential.       Assessment & Plan:   Problem List Items Addressed This Visit       Other   Chronic hip pain, right   Relevant Orders   DG HIP UNILAT W OR W/O PELVIS 2-3 VIEWS RIGHT   Hyperlipidemia with aortic atherosclerosis CT scan   Relevant Medications   rosuvastatin (CRESTOR) 10 MG tablet   Other Visit Diagnoses     Encounter to establish care    -  Primary   Medication management       Relevant Orders   EKG 12-Lead (Completed)    COVID-19 long hauler       Relevant Orders   Ambulatory referral to  Neurology   Memory changes       Relevant Orders   Ambulatory referral to Neurology   Gastroesophageal reflux disease without esophagitis       Relevant Medications   omeprazole (PRILOSEC) 20 MG capsule        Meds ordered this encounter  Medications   omeprazole (PRILOSEC) 20 MG capsule    Sig: Take 1 capsule (20 mg total) by mouth daily.    Dispense:  90 capsule    Refill:  1   rosuvastatin (CRESTOR) 10 MG tablet    Sig: Take 1 tablet (10 mg total) by mouth daily.    Dispense:  90 tablet    Refill:  1   1. Encounter to establish care 2. Medication management New patient establishment with numerous concerns to address.  Reassured her that we will continue to work on these things together as we continue each other and we will need to probably split these things into several visits in the upcoming months.  She is agreeable and understanding of this.  I went ahead and refilled her Crestor 10 mg and Prilosec 20 mg for her today.  3. COVID-19 long hauler 4. Memory changes Patient's record from her hospital admission.  She was admitted 10/19/2019 and discharged on 10/24/2019 secondary to COVID-19, acute hypoxemic respiratory failure, and pneumonia due to COVID-19 virus.  She is clearly still having numerous medical issues since her hospitalization.  Most concerning to her is her changes in memory.  As this has been an ongoing issue since her COVID diagnosis, I feel it best to have a specialist in on her case and have referred her to neurology.  5. Gastroesophageal reflux disease without esophagitis Stable on Prilosec 20 mg daily.  Refilled this medication.  Weight loss will also help and she is working with the clinic in [place].  6. Other hyperlipidemia Crestor 10 mg daily.  Refilled this.  7. Chronic hip pain, right I am reviewing her chart and do not see much information about her right hip complaint.   Decided with her that we could go ahead and start with an x-ray and then go from there.   This note was prepared with assistance of Systems analyst. Occasional wrong-word or sound-a-like substitutions may have occurred due to the inherent limitations of voice recognition software.  Time Spent: 45 minutes of total time was spent on the date of the encounter performing the following actions: chart review prior to seeing the patient, obtaining history, performing a medically necessary exam, counseling on the treatment plan, placing orders, and documenting in our EHR.    Lyrick Lagrand M Jaquaya Coyle, PA-C

## 2021-01-01 DIAGNOSIS — Z713 Dietary counseling and surveillance: Secondary | ICD-10-CM | POA: Diagnosis not present

## 2021-01-01 DIAGNOSIS — Z712 Person consulting for explanation of examination or test findings: Secondary | ICD-10-CM | POA: Diagnosis not present

## 2021-01-01 DIAGNOSIS — E7849 Other hyperlipidemia: Secondary | ICD-10-CM | POA: Diagnosis not present

## 2021-01-01 DIAGNOSIS — E668 Other obesity: Secondary | ICD-10-CM | POA: Diagnosis not present

## 2021-01-01 DIAGNOSIS — Z6841 Body Mass Index (BMI) 40.0 and over, adult: Secondary | ICD-10-CM | POA: Diagnosis not present

## 2021-01-12 ENCOUNTER — Encounter: Payer: Self-pay | Admitting: Oncology

## 2021-01-12 ENCOUNTER — Encounter: Payer: Self-pay | Admitting: Physician Assistant

## 2021-01-29 ENCOUNTER — Ambulatory Visit: Payer: Medicaid Other | Admitting: Physician Assistant

## 2021-01-30 ENCOUNTER — Ambulatory Visit: Payer: Medicaid Other | Admitting: Physician Assistant

## 2021-01-30 ENCOUNTER — Other Ambulatory Visit: Payer: Self-pay | Admitting: Internal Medicine

## 2021-02-02 NOTE — Progress Notes (Signed)
[place]  Telephone:([phone]) [phone] Fax:(336) [phone]    ID: Dawn Galvan DOB: September 15, 1975  MR#: [id number]  RRN#:[id number]  Patient Care Team: Allwardt, Randa Evens, PA-C as PCP - General (Physician Assistant) Magrinat, Virgie Dad, MD as Consulting Physician (Oncology) OTHER MD:   CHIEF COMPLAINT: PALB 2 Mutation, morbid obesity  CURRENT TREATMENT: intensified screening while definitive surgery pending   INTERVAL HISTORY: Dawn Galvan was scheduledtoday for follow up of her PALB2 mutation, however she did not show  Since her last visit, she underwent breast MRI on 01/24/2020 showing: breast composition A; two adjacent indeterminate 5 mm enhancing masses within inferior right breast; suggestion of at least 1 cortically thickened right axillary lymph node.  She returned for biopsy of the areas on 02/02/2020. Breast MRI performed at that time did not definitely show the previously seen masses. Repeat breast MRI was recommended in 6 months.  She also underwent bilateral screening mammography with tomography at [facility] on 09/19/2020 showing: breast density category A; no evidence of malignancy in either breast.    REVIEW OF SYSTEMS: Dawn Galvan    COVID 19 VACCINATION STATUS: infection 09/2019; [place] x2 in 01/2020   HISTORY OF CURRENT ILLNESS: From the original intake note:  Dawn Galvan is a 45 y.o. 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 She was originally under the care of Dr. Lindi Adie, but the patient requested a second opinion. Dawn Galvan's mother, Dawn Galvan, was also diagnosed with breast cancer related to PALB 2 mutation.  [name] opted for intensified screening but passed on antiestrogens. Because of this history, Dawn Galvan also underwent genetic testing and it returned positive for the PALB 2 mutation in July 2019.  She also decided against antiestrogens.   PAST MEDICAL HISTORY: Past Medical History:  Diagnosis Date   Abnormal chest  x-ray 03/24/2017   Acute hypoxemic respiratory failure ([place]) 10/19/2019   Carpal tunnel syndrome    right   Carpal tunnel syndrome of left wrist 07/2013   COVID-19 10/16/2019   Epistaxis 05/06/2020   Family history of breast cancer    PALB2 Mutation in Mother   Family history of prostate cancer    GERD (gastroesophageal reflux disease)    Headache(784.0)    migraines or sinus   History of vertigo    states comes and goes; no current med.   Morbid obesity with BMI of 45.0-49.9, adult ([place]) 02/07/2014   Obesity    Osteoarthritis    Pneumonia due to COVID-19 virus 10/16/2019   Formatting of this note might be different from the original. Last Assessment & Plan:  Formatting of this note might be different from the original. Patient seen via telehealth visit for COVID-19 infection. Comorbid obesity and pre-DM. Reports symptoms for the past week, and positive test 3 days ago. Complains of persistent fever with Tmax of 100.7, chills, mild SOB, body aches which are improving   PONV (postoperative nausea and vomiting)    Rash 08/10/2013   right thigh   Rash of neck 05/06/2020   Sleep apnea    does not wear cpap   Sore in mouth 05/06/2020   Sore throat 05/22/2019    PAST SURGICAL HISTORY: Past Surgical History:  Procedure Laterality Date   CARPAL TUNNEL RELEASE Left 08/18/2013   Procedure: LEFT CARPAL TUNNEL RELEASE;  Surgeon: Tennis Must, MD;  Location: [facility];  Service: Orthopedics;  Laterality: Left;   CARPAL TUNNEL RELEASE Right 04/29/2017   Procedure: RIGHT CARPAL TUNNEL RELEASE;  Surgeon: Leanora Cover, MD;  Location: [facility];  Service: Orthopedics;  Laterality: Right;   CESAREAN SECTION     CHOLECYSTECTOMY     HERNIA REPAIR     x 2   HYSTEROPLASTY REPAIR OF UTERINE ANOMALY     HYSTEROSCOPY WITH D & C  01/13/2012   Procedure: DILATATION AND CURETTAGE /HYSTEROSCOPY;  Surgeon: Osborne Oman, MD;  Location: [facility];  Service: Gynecology;  Laterality: N/A;  Pap smear   LAPAROSCOPIC GASTRIC  SLEEVE RESECTION  2016   TOE SURGERY Left    second toe. rod placed   WISDOM TOOTH EXTRACTION  11/20/10    FAMILY HISTORY: Family History  Problem Relation Age of Onset   Hypertension Mother    Breast cancer Mother    Hypertension Father    Diabetes Maternal Grandmother    Diabetes Paternal Grandmother   As of August 2020 Dawn Galvan's father is in his 42's. Patients' mother is 6: She also carries a PALB2 mutation and has been diagnosed with breast cancer. The patient has 1 brother and 2 sisters, none with cancer. Two of her maternal aunts (3 out of her mother's 8 sisters) had breast cancer. Patient denies anyone in her family having ovarian or pancreatic cancer.    GYNECOLOGIC HISTORY:  No LMP recorded. Patient has had an implant. Menarche: 45 years old Age at first live birth: 45 years old [place] P: 2 LMP: unknown, uses implant birth control Contraceptive: implanon Hysterectomy?: no BSO?: no   SOCIAL HISTORY: (Current as of 09/2020) Dawn Galvan is a single. 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 She has two children: Dawn Galvan and Dawn Galvan. Dawn Galvan is 18; she is a Museum/gallery exhibitions officer at [employer] Dawn Galvan is 38, in 9th grade.  The patient does not belong to a church, synagogue, or mosque.    ADVANCED DIRECTIVES: Not in place    HEALTH MAINTENANCE: Social History   Tobacco Use   Smoking status: Former    Years: 0.00    Types: Cigarettes    Quit date: 02/22/2009    Years since quitting: 11.9   Smokeless tobacco: Never  Vaping Use   Vaping Use: Never used  Substance Use Topics   Alcohol use: Yes    Comment: Social   Drug use: No    Colonoscopy: Dr. Lyndel Safe in [place]  [facility]  Bone density: none Mammography: [facility]  Allergies  Allergen Reactions   Doxycycline Nausea And Vomiting    Dizziness    Other Itching and Other (See Comments)    Seasonal allergies- Itchy eyes, runny nose, etc..    Current Outpatient Medications   Medication Sig Dispense Refill   acetaminophen (TYLENOL 8 HOUR) 650 MG CR tablet Take 1 tablet (650 mg total) by mouth every 8 (eight) hours as needed for pain. 30 tablet 0   COVID-19 At Home Antigen Test KIT Use as directed 1 kit 0   diclofenac Sodium (VOLTAREN) 1 % GEL Apply 2 g topically 4 (four) times daily. 100 g 3   fluticasone (FLONASE) 50 MCG/ACT nasal spray SPRAY 1 SPRAY INTO BOTH NOSTRILS DAILY. 16 mL 2   hydrocortisone cream 1 % Apply to affected area 2 times daily 15 g 0   ibuprofen (ADVIL) 600 MG tablet Take 1 tablet (600 mg total) by mouth every 6 (six) hours as needed. 30 tablet 0   LORazepam (ATIVAN) 1 MG tablet Take 1 tablet (1 mg total) by mouth as needed for anxiety (before  procedure). 12 tablet 0   omeprazole (PRILOSEC) 20 MG capsule Take 1 capsule (20 mg total) by mouth daily. 90 capsule 1   PHENTERMINE HCL PO Take by mouth.     rosuvastatin (CRESTOR) 10 MG tablet Take 1 tablet (10 mg total) by mouth daily. 90 tablet 1   sertraline (ZOLOFT) 50 MG tablet TAKE 1 TABLET BY MOUTH EVERY DAY 90 tablet 1   topiramate (TOPAMAX) 25 MG tablet Take 25 mg by mouth 2 (two) times daily.     WIXELA INHUB 250-50 MCG/ACT AEPB INHALE 1 PUFF INTO THE LUNGS TWICE A DAY 60 each 5   No current facility-administered medications for this visit.    OBJECTIVE:   There were no vitals filed for this visit.   There is no height or weight on file to calculate BMI.   Wt Readings from Last 3 Encounters:  12/25/20 (!) 303 lb 4 oz (137.6 kg)  08/05/20 275 lb (124.7 kg)  02/07/20 287 lb 3.2 oz (130.3 kg)     ECOG FS:1 - Symptomatic but completely ambulatory    LAB RESULTS:  CMP     Component Value Date/Time   NA 142 12/27/2019 1533   NA 143 02/28/2016 1117   K 4.2 12/27/2019 1533   CL 109 12/27/2019 1533   CO2 18 (L) 12/27/2019 1533   GLUCOSE 89 12/27/2019 1533   BUN 9 12/27/2019 1533   BUN 9 02/28/2016 1117   CREATININE 0.89 12/27/2019 1533   CALCIUM 10.0 12/27/2019 1533   PROT 7.4  12/27/2019 1533   ALBUMIN 4.1 12/27/2019 1533   AST 21 12/27/2019 1533   ALT 15 12/27/2019 1533   ALKPHOS 68 12/27/2019 1533   BILITOT 0.7 12/27/2019 1533   GFRNONAA >60 12/27/2019 1533   GFRAA >60 10/19/2019 1455   Lab Results  Component Value Date   WBC 6.2 10/19/2019   NEUTROABS 5.1 10/19/2019   HGB 14.9 10/19/2019   HCT 46.4 (H) 10/19/2019   MCV 91.5 10/19/2019   PLT 241 10/19/2019   No results found for: LABCA2  No components found for: [id number]  No results for input(s): INR in the last 168 hours.  No results found for: LABCA2  No results found for: NFA213  No results found for: YQM578  No results found for: ION629  No results found for: CA2729  No components found for: HGQUANT  No results found for: CEA1 / No results found for: CEA1   No results found for: AFPTUMOR  No results found for: CHROMOGRNA  No results found for: TOTALPROTELP, ALBUMINELP, A1GS, A2GS, BETS, BETA2SER, GAMS, MSPIKE, SPEI (this displays SPEP labs)  No results found for: KPAFRELGTCHN, LAMBDASER, KAPLAMBRATIO (kappa/lambda light chains)  No results found for: HGBA, HGBA2QUANT, HGBFQUANT, HGBSQUAN (Hemoglobinopathy evaluation)   Lab Results  Component Value Date   LDH 381 (H) 10/19/2019    No results found for: IRON, TIBC, IRONPCTSAT (Iron and TIBC)  Lab Results  Component Value Date   FERRITIN 1,190 (H) 10/19/2019    Urinalysis    Component Value Date/Time   COLORURINE YELLOW 11/14/2017 1244   APPEARANCEUR Clear 12/27/2019 1520   LABSPEC >=1.030 10/29/2019 1714   PHURINE 5.5 10/29/2019 1714   GLUCOSEU Negative 12/27/2019 1520   HGBUR NEGATIVE 10/29/2019 1714   BILIRUBINUR Negative 12/27/2019 1625   BILIRUBINUR Negative 12/27/2019 1520   KETONESUR TRACE (A) 10/29/2019 1714   PROTEINUR Negative 12/27/2019 1625   PROTEINUR Negative 12/27/2019 1520   PROTEINUR NEGATIVE 10/29/2019 1714   UROBILINOGEN 2.0 (A)  12/27/2019 1625   UROBILINOGEN 1.0 10/29/2019 1714    NITRITE Negative 12/27/2019 1625   NITRITE Negative 12/27/2019 1520   NITRITE NEGATIVE 10/29/2019 1714   LEUKOCYTESUR Negative 12/27/2019 1625   LEUKOCYTESUR Negative 12/27/2019 1520   LEUKOCYTESUR NEGATIVE 10/29/2019 1714    STUDIES:  No results found.   ELIGIBLE FOR AVAILABLE RESEARCH PROTOCOL: no   ASSESSMENT: 45 y.o. 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   (2) patient plans to undergo bilateral prophylactic mastectomies with reconstruction, delayed until BMI decreases  (3) intensified screening: Mammography in June, breast MRI November or December   PLAN: Dawn Galvan did not show for her 09/25/2020 visit or her 02/03/2021 visit..  Follow-up letter has been sent   Magrinat, Virgie Dad, MD  02/02/21 11:56 PM Medical Oncology and Hematology [facility] [place], [place] [address] Tel. [phone]    Fax. [phone]    I, Wilburn Mylar, am acting as scribe for Dr. Virgie Dad. Magrinat.  I, Lurline Del MD, have reviewed the above documentation for accuracy and completeness, and I agree with the above.   *Total Encounter Time as defined by the Centers for Medicare and Medicaid Services includes, in addition to the face-to-face time of a patient visit (documented in the note above) non-face-to-face time: obtaining and reviewing outside history, ordering and reviewing medications, tests or procedures, care coordination (communications with other health care professionals or caregivers) and documentation in the medical record.

## 2021-02-03 ENCOUNTER — Ambulatory Visit: Payer: Medicaid Other | Admitting: Oncology

## 2021-02-03 ENCOUNTER — Encounter: Payer: Self-pay | Admitting: Oncology

## 2021-02-04 ENCOUNTER — Telehealth: Payer: Self-pay

## 2021-02-04 ENCOUNTER — Encounter: Payer: Self-pay | Admitting: Oncology

## 2021-02-04 ENCOUNTER — Ambulatory Visit: Payer: Medicaid Other | Admitting: Physician Assistant

## 2021-02-04 NOTE — Telephone Encounter (Signed)
Pt called stating that she will be over 10 minutes late to her appt for today. Pt became upset because her appt was set for 12 and we did not change her time in MyChart to tell her to be here at 11:30. Pt stated that since she is working from home she does not feel like coming to appts and is always late. Pt wanted to schedule another appt for Alyssa. Pt is now scheduled for 9:30 on 12/15. Pt wanted me to change appt time in her MyChart to 9:00 so that she would be here by 9:30. I informed pt that due to it being a scheduled time, I cannot change times in MyChart. Pt then hung up phone.

## 2021-02-05 NOTE — Telephone Encounter (Signed)
Left patient a VM to return call.

## 2021-02-06 ENCOUNTER — Other Ambulatory Visit: Payer: Self-pay

## 2021-02-06 ENCOUNTER — Ambulatory Visit: Payer: Medicaid Other | Admitting: Physician Assistant

## 2021-02-06 ENCOUNTER — Encounter: Payer: Self-pay | Admitting: Physician Assistant

## 2021-02-06 VITALS — BP 126/87 | HR 82 | Temp 98.2°F | Ht 63.5 in | Wt 295.2 lb

## 2021-02-06 DIAGNOSIS — R7303 Prediabetes: Secondary | ICD-10-CM | POA: Diagnosis not present

## 2021-02-06 DIAGNOSIS — Z79899 Other long term (current) drug therapy: Secondary | ICD-10-CM | POA: Diagnosis not present

## 2021-02-06 DIAGNOSIS — R413 Other amnesia: Secondary | ICD-10-CM | POA: Diagnosis not present

## 2021-02-06 DIAGNOSIS — E7849 Other hyperlipidemia: Secondary | ICD-10-CM | POA: Diagnosis not present

## 2021-02-06 DIAGNOSIS — U099 Post covid-19 condition, unspecified: Secondary | ICD-10-CM | POA: Diagnosis not present

## 2021-02-06 DIAGNOSIS — R0609 Other forms of dyspnea: Secondary | ICD-10-CM | POA: Diagnosis not present

## 2021-02-06 LAB — LIPID PANEL
Cholesterol: 151 mg/dL (ref 0–200)
HDL: 58 mg/dL (ref >39.00)
LDL Cholesterol: 77 mg/dL (ref 0–99)
NonHDL: 92.64
Total CHOL/HDL Ratio: 3
Triglycerides: 79 mg/dL (ref 0.0–149.0)
VLDL: 15.8 mg/dL (ref 0.0–40.0)

## 2021-02-06 LAB — COMPREHENSIVE METABOLIC PANEL
ALT: 12 U/L (ref 0–35)
AST: 16 U/L (ref 0–37)
Albumin: 4.1 g/dL (ref 3.5–5.2)
Alkaline Phosphatase: 79 U/L (ref 39–117)
BUN: 11 mg/dL (ref 6–23)
CO2: 27 mEq/L (ref 19–32)
Calcium: 9.4 mg/dL (ref 8.4–10.5)
Chloride: 105 mEq/L (ref 96–112)
Creatinine, Ser: 0.77 mg/dL (ref 0.40–1.20)
GFR: 93.45 mL/min (ref >60.00)
Glucose, Bld: 85 mg/dL (ref 70–99)
Potassium: 4.4 mEq/L (ref 3.5–5.1)
Sodium: 140 mEq/L (ref 135–145)
Total Bilirubin: 0.7 mg/dL (ref 0.2–1.2)
Total Protein: 7.4 g/dL (ref 6.0–8.3)

## 2021-02-06 NOTE — Patient Instructions (Signed)
Good to see you today. Please go to the lab for blood work and I will send results through MyChart. Pending results, will need to plan from there.  Thanks for coming in.

## 2021-02-06 NOTE — Progress Notes (Signed)
Subjective:    Patient ID: Dawn Galvan, female    DOB: 24-Oct-1975, 45 y.o.   MRN: [id number]  No chief complaint on file.   HPI Patient is in today for recheck from previous and also for fasting labs. No new concerns today.  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   Morbid obesity with BMI of 45.0-49.9, adult ([place]) 02/07/2014   Obesity    Osteoarthritis    Pneumonia due to COVID-19 virus 10/16/2019   Formatting of this note might be different from the original. Last Assessment & Plan:  Formatting of this note might be different from the original. Patient seen via telehealth visit for COVID-19 infection. Comorbid obesity and pre-DM. Reports symptoms for the past week, and positive test 3 days ago. Complains of persistent fever with Tmax of 100.7, chills, mild SOB, body aches which are improving   PONV (postoperative nausea and vomiting)    Rash 08/10/2013   right thigh   Rash of neck 05/06/2020   Sleep apnea    does not wear cpap   Sore in mouth 05/06/2020   Sore throat 05/22/2019    Past Surgical History:  Procedure Laterality Date   CARPAL TUNNEL RELEASE Left 08/18/2013   Procedure: LEFT CARPAL TUNNEL RELEASE;  Surgeon: Tennis Must, MD;  Location: [facility];  Service: Orthopedics;  Laterality: Left;   CARPAL TUNNEL RELEASE Right 04/29/2017   Procedure: RIGHT  CARPAL TUNNEL RELEASE;  Surgeon: Leanora Cover, MD;  Location: [facility];  Service: Orthopedics;  Laterality: Right;   CESAREAN SECTION     CHOLECYSTECTOMY     HERNIA REPAIR     x 2   HYSTEROPLASTY REPAIR OF UTERINE ANOMALY     HYSTEROSCOPY WITH D & C  01/13/2012   Procedure: DILATATION AND CURETTAGE /HYSTEROSCOPY;  Surgeon: Osborne Oman, MD;  Location: [facility];  Service: Gynecology;  Laterality: N/A;  Pap smear   LAPAROSCOPIC GASTRIC SLEEVE RESECTION  2016   TOE SURGERY Left    second toe. rod placed   WISDOM TOOTH EXTRACTION  11/20/10    Family History  Problem Relation Age of Onset   Hypertension Mother    Breast cancer Mother    Hypertension Father    Diabetes Maternal Grandmother    Diabetes Paternal Grandmother     Social History   Tobacco Use   Smoking status: Former    Years: 0.00    Types: Cigarettes    Quit date: 02/22/2009    Years since quitting: 11.9   Smokeless tobacco: Never  Vaping Use   Vaping Use: Never used  Substance Use Topics   Alcohol use: Yes    Comment: Social   Drug use: No     Allergies  Allergen Reactions   Doxycycline Nausea And Vomiting  Dizziness    Other Itching and Other (See Comments)    Seasonal allergies- Itchy eyes, runny nose, etc..    Review of Systems NEGATIVE UNLESS OTHERWISE INDICATED IN HPI      Objective:     BP 126/87    Pulse 82    Temp 98.2 F (36.8 C)    Ht 5' 3.5" (1.613 m)    Wt 295 lb 4 oz (133.9 kg)    SpO2 99%    BMI 51.48 kg/m   Wt Readings from Last 3 Encounters:  02/06/21 295 lb 4 oz (133.9 kg)  12/25/20 (!) 303 lb 4 oz (137.6 kg)  08/05/20 275 lb (124.7 kg)    BP Readings from Last 3 Encounters:  02/06/21 126/87  12/25/20 124/84  08/05/20 119/67     Physical Exam Vitals and nursing note reviewed.  Constitutional:      Appearance: Normal appearance. She is obese. She is not toxic-appearing.  HENT:     Head: Normocephalic and atraumatic.     Right Ear: External ear normal.     Left  Ear: External ear normal.     Nose: Nose normal.     Mouth/Throat:     Mouth: Mucous membranes are moist.  Eyes:     Extraocular Movements: Extraocular movements intact.     Conjunctiva/sclera: Conjunctivae normal.     Pupils: Pupils are equal, round, and reactive to light.  Cardiovascular:     Rate and Rhythm: Normal rate and regular rhythm.     Pulses: Normal pulses.     Heart sounds: Normal heart sounds.  Pulmonary:     Effort: Pulmonary effort is normal.     Breath sounds: Normal breath sounds.  Abdominal:     General: Abdomen is flat. Bowel sounds are normal.     Palpations: Abdomen is soft.  Musculoskeletal:        General: Normal range of motion.     Cervical back: Normal range of motion and neck supple.  Skin:    General: Skin is warm and dry.  Neurological:     General: No focal deficit present.     Mental Status: She is alert and oriented to person, place, and time.  Psychiatric:        Mood and Affect: Affect is angry (irritated about recent phone communications).        Speech: Speech is tangential.       Assessment & Plan:   Problem List Items Addressed This Visit       Other   Prediabetes - Primary   Relevant Orders   Comprehensive metabolic panel (Completed)   Hemoglobin A1c   Post-COVID chronic dyspnea   Relevant Orders   CBC with Differential/Platelet   Comprehensive metabolic panel (Completed)   Hyperlipidemia with aortic atherosclerosis CT scan   Relevant Orders   Lipid panel (Completed)   Hemoglobin A1c   Other Visit Diagnoses     Memory changes       Relevant Orders   Comprehensive metabolic panel (Completed)   Medication management       Relevant Orders   CBC with Differential/Platelet   Comprehensive metabolic panel (Completed)   Lipid panel (Completed)   Hemoglobin A1c       Plan: -Will check labs today and treat pending results -She will continue regular f/up with specialists -Discussed with patient importance of coming to  appointments. Willing to work with her, but she needs to be motivated and tell us where we  can assist her as well.  -Encouraged working on healthy lifestyle changes. -F/up in 3-6 months, or prn.   Thurston Brendlinger M Rito Lecomte, PA-C

## 2021-02-12 NOTE — Progress Notes (Signed)
[facility] [facility]  Telephone:([phone]) [phone] Fax:(336) [phone]    ID: Dawn Galvan DOB: 14-Feb-1976  MR#: [id number]  MHW#:[id number]  Patient Care Team: AllwardtRanda Evens, PA-C as PCP - General (Physician Assistant) Kenyon Eshleman, Virgie Dad, MD as Consulting Physician (Oncology) OTHER MD:   CHIEF COMPLAINT: PALB 2 Mutation, morbid obesity  CURRENT TREATMENT: intensified screening while definitive surgery pending   INTERVAL HISTORY: Dawn Galvan returns today for follow up of her PALB2 mutation  Since her last visit, she underwent breast MRI on 01/24/2020 showing: breast composition A; two adjacent indeterminate 5 mm enhancing masses within inferior right breast; suggestion of at least 1 cortically thickened right axillary lymph node.  She returned for biopsy of the areas on 02/02/2020. Breast MRI performed at that time did not definitely show the previously seen masses. Repeat breast MRI was recommended in 6 months.  She also underwent bilateral screening mammography with tomography at [facility] on 09/19/2020 showing: breast density category A; no evidence of malignancy in either breast.    REVIEW OF SYSTEMS: Dawn Galvan has been through a difficult time; she was homeless for one year, unable to afford rent. She is now more settled. She continues to try to lose enough weight to be able to undergo surgery. She is going through the [employer] through [place]. However she was not able to give me a clear account of her diet and does not seem to be excercising regularly. She did lose weight (she is s/p gastric sleeve) from a height of 394 lbs to the current 295. Her goal is<240 lbs.   COVID 19 VACCINATION STATUS: infection 09/2019; [place] x2 in 01/2020   HISTORY OF CURRENT ILLNESS: From the original intake note:  Dawn Galvan is a 45 y.o. 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 She was originally under the care of Dr. Lindi Adie, but the patient requested a  second opinion. Dawn Galvan's mother, Dawn Galvan, was also diagnosed with breast cancer related to PALB 2 mutation.  [name] opted for intensified screening but passed on antiestrogens. Because of this history, Dawn Galvan also underwent genetic testing and it returned positive for the PALB 2 mutation in July 2019.  She also decided against antiestrogens.   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   Morbid obesity with BMI of 45.0-49.9, adult ([place]) 02/07/2014   Obesity    Osteoarthritis    Pneumonia due to COVID-19 virus 10/16/2019   Formatting of this note might be different from the original. Last Assessment & Plan:  Formatting of this note might be different from the original. Patient seen via telehealth visit for COVID-19 infection. Comorbid obesity and pre-DM. Reports symptoms for the past week, and positive test 3 days ago. Complains of persistent fever with Tmax of 100.7, chills, mild SOB, body aches which are improving   PONV (postoperative nausea and vomiting)    Rash 08/10/2013   right thigh   Rash  of neck 05/06/2020   Sleep apnea    does not wear cpap   Sore in mouth 05/06/2020   Sore throat 05/22/2019    PAST SURGICAL HISTORY: Past Surgical History:  Procedure Laterality Date   CARPAL TUNNEL RELEASE Left 08/18/2013   Procedure: LEFT CARPAL TUNNEL RELEASE;  Surgeon: Tennis Must, MD;  Location: [facility];  Service: Orthopedics;  Laterality: Left;   CARPAL TUNNEL RELEASE Right 04/29/2017   Procedure: RIGHT CARPAL  TUNNEL RELEASE;  Surgeon: Leanora Cover, MD;  Location: [facility];  Service: Orthopedics;  Laterality: Right;   CESAREAN SECTION     CHOLECYSTECTOMY     HERNIA REPAIR     x 2   HYSTEROPLASTY REPAIR OF UTERINE ANOMALY     HYSTEROSCOPY WITH D & C  01/13/2012   Procedure: DILATATION AND CURETTAGE /HYSTEROSCOPY;  Surgeon: Osborne Oman, MD;  Location: [facility];  Service: Gynecology;  Laterality: N/A;  Pap smear   LAPAROSCOPIC GASTRIC SLEEVE RESECTION  2016   TOE SURGERY Left    second toe. rod placed   WISDOM TOOTH EXTRACTION  11/20/10    FAMILY HISTORY: Family History  Problem Relation Age of Onset   Hypertension Mother    Breast cancer Mother    Hypertension Father    Diabetes Maternal Grandmother    Diabetes Paternal Grandmother   As of August 2020 Dawn Galvan's father is in his 24's. Patients' mother is 81: She also carries a PALB2 mutation and has been diagnosed with breast cancer. The patient has 1 brother and 2 sisters, none with cancer. Two of her maternal aunts (3 out of her mother's 8 sisters) had breast cancer. Patient denies anyone in her family having ovarian or pancreatic cancer.    GYNECOLOGIC HISTORY:  No LMP recorded. Patient has had an implant. Menarche: 45 years old Age at first live birth: 45 years old [place] P: 2 LMP: unknown, uses implant birth control Contraceptive: implanon Hysterectomy?: no BSO?: no   SOCIAL HISTORY:  Dawn Galvan is a single. 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 She has two children: Dawn Galvan and [name]. 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 [name] is in 11th grade.  The patient does not belong to a church, synagogue, or mosque.    ADVANCED DIRECTIVES: Not in place    HEALTH MAINTENANCE: Social History   Tobacco Use   Smoking status: Former    Years: 0.00    Types: Cigarettes    Quit date: 02/22/2009    Years since quitting: 11.9   Smokeless tobacco: Never  Vaping Use   Vaping Use: Never used  Substance Use Topics    Alcohol use: Yes    Comment: Social   Drug use: No    Colonoscopy: Dr. Lyndel Safe in [place]  [facility]  Bone density: none [facility]ammography[facility]  Allergies  Allergen Reactions   Doxycycline Nausea And Vomiting    Dizziness    Other Itching and Other (See Comments)    Seasonal allergies- Itchy eyes, runny nose, etc..    Current Outpatient Medications  Medication Sig Dispense Refill   acetaminophen (TYLENOL 8 HOUR) 650 MG CR tablet Take 1 tablet (650 mg total) by mouth every 8 (eight) hours as needed for pain. 30 tablet 0   COVID-19 At Home Antigen Test KIT Use as directed 1 kit 0   diclofenac Sodium (VOLTAREN) 1 % GEL Apply 2 g topically 4 (four) times daily. 100 g 3  fluticasone (FLONASE) 50 MCG/ACT nasal spray SPRAY 1 SPRAY INTO BOTH NOSTRILS DAILY. 16 mL 2   hydrocortisone cream 1 % Apply to affected area 2 times daily 15 g 0   ibuprofen (ADVIL) 600 MG tablet Take 1 tablet (600 mg total) by mouth every 6 (six) hours as needed. 30 tablet 0   LORazepam (ATIVAN) 1 MG tablet Take 1 tablet (1 mg total) by mouth as needed for anxiety (before procedure). 12 tablet 0   omeprazole (PRILOSEC) 20 MG capsule Take 1 capsule (20 mg total) by mouth daily. 90 capsule 1   PHENTERMINE HCL PO Take by mouth.     rosuvastatin (CRESTOR) 10 MG tablet Take 1 tablet (10 mg total) by mouth daily. 90 tablet 1   sertraline (ZOLOFT) 50 MG tablet TAKE 1 TABLET BY MOUTH EVERY DAY 90 tablet 1   topiramate (TOPAMAX) 25 MG tablet Take 25 mg by mouth 2 (two) times daily.     WIXELA INHUB 250-50 MCG/ACT AEPB INHALE 1 PUFF INTO THE LUNGS TWICE A DAY 60 each 5   No current facility-administered medications for this visit.    OBJECTIVE: African American woman in no acute distress  Vitals:   02/13/21 1624  BP: (!) 149/96  Pulse: 98  Resp: 18  Temp: 98.1 F (36.7 C)  SpO2: 99%     Body mass index is 51.52 kg/m.   Wt Readings from Last 3 Encounters:  02/13/21 295 lb 8 oz (134 kg)   02/06/21 295 lb 4 oz (133.9 kg)  12/25/20 (!) 303 lb 4 oz (137.6 kg)     ECOG FS:1 - Symptomatic but completely ambulatory  Sclerae unicteric, EOMs intact Wearing a mask No cervical or supraclavicular adenopathy Lungs no rales or rhonchi Heart regular rate and rhythm Abd soft, obese, nontender, positive bowel sounds MSK no focal spinal tenderness Neuro: nonfocal, well oriented, appropriate affect Breasts: no masses palpated; no skin or nipple changes of concern   LAB RESULTS:  CMP     Component Value Date/Time   NA 140 02/06/2021 1026   NA 143 02/28/2016 1117   K 4.4 02/06/2021 1026   CL 105 02/06/2021 1026   CO2 27 02/06/2021 1026   GLUCOSE 85 02/06/2021 1026   BUN 11 02/06/2021 1026   BUN 9 02/28/2016 1117   CREATININE 0.77 02/06/2021 1026   CALCIUM 9.4 02/06/2021 1026   PROT 7.4 02/06/2021 1026   ALBUMIN 4.1 02/06/2021 1026   AST 16 02/06/2021 1026   ALT 12 02/06/2021 1026   ALKPHOS 79 02/06/2021 1026   BILITOT 0.7 02/06/2021 1026   GFRNONAA >60 12/27/2019 1533   GFRAA >60 10/19/2019 1455   Lab Results  Component Value Date   WBC 6.2 10/19/2019   NEUTROABS 5.1 10/19/2019   HGB 14.9 10/19/2019   HCT 46.4 (H) 10/19/2019   MCV 91.5 10/19/2019   PLT 241 10/19/2019   No results found for: LABCA2  No components found for: [id number]  No results for input(s): INR in the last 168 hours.  No results found for: LABCA2  No results found for: OYD741  No results found for: OIN867  No results found for: EHM094  No results found for: CA2729  No components found for: HGQUANT  No results found for: CEA1 / No results found for: CEA1   No results found for: AFPTUMOR  No results found for: CHROMOGRNA  No results found for: TOTALPROTELP, ALBUMINELP, A1GS, A2GS, BETS, BETA2SER, GAMS, MSPIKE, SPEI (this displays SPEP labs)  No results  found for: KPAFRELGTCHN, LAMBDASER, KAPLAMBRATIO (kappa/lambda light chains)  No results found for: HGBA, HGBA2QUANT,  HGBFQUANT, HGBSQUAN (Hemoglobinopathy evaluation)   Lab Results  Component Value Date   LDH 381 (H) 10/19/2019    No results found for: IRON, TIBC, IRONPCTSAT (Iron and TIBC)  Lab Results  Component Value Date   FERRITIN 1,190 (H) 10/19/2019    Urinalysis    Component Value Date/Time   COLORURINE YELLOW 11/14/2017 1244   APPEARANCEUR Clear 12/27/2019 1520   LABSPEC >=1.030 10/29/2019 1714   PHURINE 5.5 10/29/2019 1714   GLUCOSEU Negative 12/27/2019 1520   HGBUR NEGATIVE 10/29/2019 1714   BILIRUBINUR Negative 12/27/2019 1625   BILIRUBINUR Negative 12/27/2019 1520   KETONESUR TRACE (A) 10/29/2019 1714   PROTEINUR Negative 12/27/2019 1625   PROTEINUR Negative 12/27/2019 1520   PROTEINUR NEGATIVE 10/29/2019 1714   UROBILINOGEN 2.0 (A) 12/27/2019 1625   UROBILINOGEN 1.0 10/29/2019 1714   NITRITE Negative 12/27/2019 1625   NITRITE Negative 12/27/2019 1520   NITRITE NEGATIVE 10/29/2019 1714   LEUKOCYTESUR Negative 12/27/2019 1625   LEUKOCYTESUR Negative 12/27/2019 1520   LEUKOCYTESUR NEGATIVE 10/29/2019 1714    STUDIES:  No results found.   ELIGIBLE FOR AVAILABLE RESEARCH PROTOCOL: no   ASSESSMENT: 45 y.o. 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   (2) patient plans to undergo bilateral prophylactic mastectomies with reconstruction, delayed until BMI decreases  (3) intensified screening: Mammography in June, breast MRI November or December   PLAN: Saydi is working with Osborne Oman to lose enough weight that she can undergo bilateral mastectomies; she also hopes to undergo reconstruction.  In the meantime she is being followed with intensified screening. She is due for breast MRI in January then repeat mammography in July.  It could be if she simply had bilateral mastectomies and  delayed reconstruction weight loss might not need to be so drastic. She will discuss that with her surgeon.  She will return to see [place] in August after her mammogram. She knows to call for any problems that might bdevelop before then.  Total encounter time 25 minutes. *   Giuseppina Quinones, Virgie Dad, MD  02/13/21 8:28 PM Medical Oncology and Hematology [facility] [place], [place] [address] Tel. [phone]    Fax. [phone]    I, Wilburn Mylar, am acting as scribe for Dr. Virgie Dad. Beryle Bagsby.  I, Lurline Del MD, have reviewed the above documentation for accuracy and completeness, and I agree with the above.   *Total Encounter Time as defined by the Centers for Medicare and Medicaid Services includes, in addition to the face-to-face time of a patient visit (documented in the note above) non-face-to-face time: obtaining and reviewing outside history, ordering and reviewing medications, tests or procedures, care coordination (communications with other health care professionals or caregivers) and documentation in the medical record.

## 2021-02-13 ENCOUNTER — Inpatient Hospital Stay: Payer: Medicaid Other | Attending: Oncology | Admitting: Oncology

## 2021-02-13 ENCOUNTER — Other Ambulatory Visit: Payer: Self-pay

## 2021-02-13 VITALS — BP 149/96 | HR 98 | Temp 98.1°F | Resp 18 | Ht 63.5 in | Wt 295.5 lb

## 2021-02-13 DIAGNOSIS — Z1589 Genetic susceptibility to other disease: Secondary | ICD-10-CM | POA: Diagnosis not present

## 2021-02-13 DIAGNOSIS — Z803 Family history of malignant neoplasm of breast: Secondary | ICD-10-CM | POA: Diagnosis not present

## 2021-02-13 DIAGNOSIS — Z1501 Genetic susceptibility to malignant neoplasm of breast: Secondary | ICD-10-CM | POA: Diagnosis not present

## 2021-02-13 DIAGNOSIS — Z1509 Genetic susceptibility to other malignant neoplasm: Secondary | ICD-10-CM

## 2021-02-19 ENCOUNTER — Telehealth: Payer: Self-pay | Admitting: Oncology

## 2021-02-19 NOTE — Telephone Encounter (Signed)
Scheduled appointment per 12/22 los. Left message. °

## 2021-03-02 ENCOUNTER — Other Ambulatory Visit: Payer: Self-pay | Admitting: Internal Medicine

## 2021-03-11 ENCOUNTER — Ambulatory Visit: Payer: Medicaid Other | Admitting: Family

## 2021-03-11 ENCOUNTER — Ambulatory Visit (HOSPITAL_BASED_OUTPATIENT_CLINIC_OR_DEPARTMENT_OTHER)
Admission: RE | Admit: 2021-03-11 | Discharge: 2021-03-11 | Disposition: A | Discharge status: Home / Self Care | Payer: Medicaid Other | Source: Ambulatory Visit | Attending: Family Medicine | Admitting: Family Medicine

## 2021-03-11 ENCOUNTER — Other Ambulatory Visit: Payer: Self-pay

## 2021-03-11 ENCOUNTER — Ambulatory Visit (INDEPENDENT_AMBULATORY_CARE_PROVIDER_SITE_OTHER): Payer: Medicaid Other | Admitting: Family Medicine

## 2021-03-11 ENCOUNTER — Encounter: Payer: Self-pay | Admitting: Family Medicine

## 2021-03-11 VITALS — BP 139/82 | HR 92 | Temp 98.5°F | Resp 12 | Ht 63.5 in | Wt 296.6 lb

## 2021-03-11 DIAGNOSIS — M25512 Pain in left shoulder: Secondary | ICD-10-CM

## 2021-03-11 DIAGNOSIS — R21 Rash and other nonspecific skin eruption: Secondary | ICD-10-CM | POA: Diagnosis not present

## 2021-03-11 DIAGNOSIS — M19012 Primary osteoarthritis, left shoulder: Secondary | ICD-10-CM | POA: Diagnosis not present

## 2021-03-11 DIAGNOSIS — M542 Cervicalgia: Secondary | ICD-10-CM | POA: Diagnosis not present

## 2021-03-11 MED ORDER — NAPROXEN 500 MG PO TABS: 500.0000 mg | ORAL_TABLET | Freq: Two times a day (BID) | ORAL | 0 refills | Status: DC

## 2021-03-11 MED ORDER — DICLOFENAC SODIUM 1 % EX GEL: 2.0000 g | Freq: Four times a day (QID) | CUTANEOUS | 3 refills | Status: DC

## 2021-03-11 NOTE — Progress Notes (Signed)
Acute Office Visit  Subjective:    Patient ID: Dawn Galvan, female    DOB: 1975/03/21, 46 y.o.   MRN: [id number]  Chief Complaint  Patient presents with   left shoulder and arm pain    Started over a month ago   Rash    Hand, arm, neck, back, right shoulder.    HPI Patient is in today for left shoulder pain.   Patient reports that a little over a month ago she started noticed some left shoulder pain that radiated into her left arm.  She cannot recall any specific injuries or trauma.  States that she started out of nowhere 1 day.  She reports every movement hurts, lifting, bending, stretching, lying down, etc.  She cannot recall any specific activities that will trigger the pain to be worse.  States the pain just varies day-to-day anywhere from 5-10 out of 10.  Pain typically starts at the top of her shoulder and radiates down into her arm all the way to her wrist at times.  She denies any numbness or tingling.  She denies any neck pain.  She has been taking ibuprofen and Tylenol and using an over-the-counter cream for the pain which seems to help temporarily.  Additionally she reports a rash that started coming up a couple days ago with small itchy areas to left wrist, right forearm, right upper arm, left foot, left neck, upper back.  States when it initially comes up it is itchy and slightly bumpy but resolves in about a day.  States she has been using Neosporin and it seems to be working.  She denies any dry skin or history of eczema.  No systemic symptoms.   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   Morbid obesity with BMI of 45.0-49.9, adult ([place]) 02/07/2014   Obesity    Osteoarthritis    Pneumonia due to COVID-19 virus 10/16/2019   Formatting of this note might be different from the original. Last Assessment & Plan:  Formatting of this note might be different from the original. Patient seen via telehealth visit for COVID-19 infection. Comorbid obesity and pre-DM. Reports symptoms for the past week, and positive test 3 days ago. Complains of persistent fever with Tmax of 100.7, chills, mild SOB, body aches which are improving   PONV (postoperative nausea and vomiting)    Rash 08/10/2013   right thigh   Rash of neck 05/06/2020   Sleep apnea    does not wear cpap   Sore in mouth 05/06/2020   Sore throat 05/22/2019    Past Surgical History:  Procedure Laterality Date   CARPAL TUNNEL RELEASE Left 08/18/2013   Procedure: LEFT CARPAL TUNNEL RELEASE;  Surgeon: Tennis Must, MD;  Location: [facility];  Service: Orthopedics;  Laterality: Left;   CARPAL TUNNEL RELEASE Right 04/29/2017   Procedure: RIGHT CARPAL TUNNEL RELEASE;  Surgeon: Leanora Cover, MD;  Location: [facility];  Service: Orthopedics;  Laterality: Right;  CESAREAN SECTION     CHOLECYSTECTOMY     HERNIA REPAIR     x 2   HYSTEROPLASTY REPAIR OF UTERINE ANOMALY     HYSTEROSCOPY WITH D & C  01/13/2012   Procedure: DILATATION AND CURETTAGE /HYSTEROSCOPY;  Surgeon: Osborne Oman, MD;  Location: [facility];  Service: Gynecology;  Laterality: N/A;  Pap smear   LAPAROSCOPIC GASTRIC SLEEVE RESECTION  2016   TOE SURGERY Left    second toe. rod placed   WISDOM TOOTH EXTRACTION  11/20/10    Family History  Problem Relation Age of Onset   Hypertension Mother    Breast cancer Mother    Hypertension Father    Diabetes Maternal Grandmother    Diabetes Paternal Grandmother     Social History   Socioeconomic History   Marital status: Single    Spouse name: Not  on file   Number of children: Not on file   Years of education: Not on file   Highest education level: Not on file  Occupational History   Not on file  Tobacco Use   Smoking status: Former    Years: 0.00    Types: Cigarettes    Quit date: 02/22/2009    Years since quitting: 12.0   Smokeless tobacco: Never  Vaping Use   Vaping Use: Never used  Substance and Sexual Activity   Alcohol use: Yes    Comment: Social   Drug use: No   Sexual activity: Not Currently    Birth control/protection: Implant    Comment: never' \smoke'  routinue bases  Other Topics Concern   Not on file  Social History Narrative   Not on file   Social Determinants of Health   Financial Resource Strain: Not on file  Food Insecurity: Not on file  Transportation Needs: Not on file  Physical Activity: Not on file  Stress: Not on file  Social Connections: Not on file  Intimate Partner Violence: Not on file    Outpatient Medications Prior to Visit  Medication Sig Dispense Refill   acetaminophen (TYLENOL 8 HOUR) 650 MG CR tablet Take 1 tablet (650 mg total) by mouth every 8 (eight) hours as needed for pain. 30 tablet 0   etonogestrel (NEXPLANON) 68 MG IMPL implant 1 each by Subdermal route once.     fluticasone (FLONASE) 50 MCG/ACT nasal spray SPRAY 1 SPRAY INTO BOTH NOSTRILS DAILY. 16 mL 2   omeprazole (PRILOSEC) 20 MG capsule Take 1 capsule (20 mg total) by mouth daily. 90 capsule 1   PHENTERMINE HCL PO Take by mouth.     rosuvastatin (CRESTOR) 10 MG tablet Take 1 tablet (10 mg total) by mouth daily. 90 tablet 1   sertraline (ZOLOFT) 50 MG tablet TAKE 1 TABLET BY MOUTH EVERY DAY 90 tablet 1   WIXELA INHUB 250-50 MCG/ACT AEPB INHALE 1 PUFF INTO THE LUNGS TWICE A DAY 60 each 5   ibuprofen (ADVIL) 600 MG tablet Take 1 tablet (600 mg total) by mouth every 6 (six) hours as needed. 30 tablet 0   LORazepam (ATIVAN) 1 MG tablet Take 1 tablet (1 mg total) by mouth as needed for anxiety (before procedure). (Patient not  taking: Reported on 03/11/2021) 12 tablet 0   topiramate (TOPAMAX) 25 MG tablet Take 25 mg by mouth 2 (two) times daily. (Patient not taking: Reported on 03/11/2021)     COVID-19 At Home Antigen Test KIT Use as directed 1 kit 0   diclofenac Sodium (VOLTAREN) 1 % GEL Apply 2  g topically 4 (four) times daily. (Patient not taking: Reported on 03/11/2021) 100 g 3   hydrocortisone cream 1 % Apply to affected area 2 times daily 15 g 0   No facility-administered medications prior to visit.    Allergies  Allergen Reactions   Doxycycline Nausea And Vomiting    Dizziness    Other Itching and Other (See Comments)    Seasonal allergies- Itchy eyes, runny nose, etc..    Review of Systems All review of systems negative except what is listed in the HPI     Objective:    Physical Exam Vitals reviewed.  Constitutional:      Appearance: Normal appearance. She is obese.  HENT:     Head: Normocephalic and atraumatic.  Musculoskeletal:        General: Tenderness present. No swelling or deformity.     Cervical back: Normal range of motion and neck supple. No tenderness.     Comments: Left upper traps with palpable muscle tension and pain to palpation L shoulder with genearlized pain to palpation; +Apley scratch, decreased abduction with pain, + Hawkins, -Neers, + pain with resisted wrist supination  Lymphadenopathy:     Cervical: No cervical adenopathy.  Skin:    Findings: Rash present.     Comments: Small areas of maculopapular rash, that are now scabbed over (she has been scratching), no edema, erythema, warmth, drainage; eczema-type appearance   Neurological:     General: No focal deficit present.     Mental Status: She is alert and oriented to person, place, and time. Mental status is at baseline.  Psychiatric:        Mood and Affect: Mood normal.        Behavior: Behavior normal.        Thought Content: Thought content normal.        Judgment: Judgment normal.      BP 139/82    Pulse  92    Temp 98.5 F (36.9 C) (Oral)    Resp 12    Ht 5' 3.5" (1.613 m)    Wt 296 lb 9.6 oz (134.5 kg)    SpO2 100%    BMI 51.72 kg/m  Wt Readings from Last 3 Encounters:  03/11/21 296 lb 9.6 oz (134.5 kg)  02/13/21 295 lb 8 oz (134 kg)  02/06/21 295 lb 4 oz (133.9 kg)    Health Maintenance Due  Topic Date Due   Pneumococcal Vaccine 86-36 Years old (1 - PCV) Never done   Hepatitis C Screening  Never done   COVID-19 Vaccine (3 - Pfizer risk series) 03/17/2020   INFLUENZA VACCINE  Never done   COLONOSCOPY (Pts 45-4yr Insurance coverage will need to be confirmed)  Never done    There are no preventive care reminders to display for this patient.   No results found for: TSH Lab Results  Component Value Date   WBC 6.2 10/19/2019   HGB 14.9 10/19/2019   HCT 46.4 (H) 10/19/2019   MCV 91.5 10/19/2019   PLT 241 10/19/2019   Lab Results  Component Value Date   NA 140 02/06/2021   K 4.4 02/06/2021   CO2 27 02/06/2021   GLUCOSE 85 02/06/2021   BUN 11 02/06/2021   CREATININE 0.77 02/06/2021   BILITOT 0.7 02/06/2021   ALKPHOS 79 02/06/2021   AST 16 02/06/2021   ALT 12 02/06/2021   PROT 7.4 02/06/2021   ALBUMIN 4.1 02/06/2021   CALCIUM 9.4 02/06/2021   ANIONGAP 15 12/27/2019  GFR 93.45 02/06/2021   Lab Results  Component Value Date   CHOL 151 02/06/2021   Lab Results  Component Value Date   HDL 58.00 02/06/2021   Lab Results  Component Value Date   LDLCALC 77 02/06/2021   Lab Results  Component Value Date   TRIG 79.0 02/06/2021   Lab Results  Component Value Date   CHOLHDL 3 02/06/2021   Lab Results  Component Value Date   HGBA1C 5.8 02/28/2016       Assessment & Plan:     1. Acute pain of left shoulder Start naproxen 500 mg twice a day - do NOT take with any other antiinflammatories (ibuprofen, Aleve, etc.) You can continue occasional tylenol if needed Ice/heat for comfort a few times per day Home exercises/stretches - handout provided; consider  PT Xray of left shoulder and neck today - consider radicular symptoms vs shoulder pain radiation Placing referral to sports medicine for further evaluation if needed and consider injection depending on xray results. Refilling your Voltaren gel - you can try this as well    - diclofenac Sodium (VOLTAREN) 1 % GEL; Apply 2 g topically 4 (four) times daily.  Dispense: 100 g; Refill: 3 - DG Shoulder Left; Future - DG Cervical Spine Complete; Future - naproxen (NAPROSYN) 500 MG tablet; Take 1 tablet (500 mg total) by mouth 2 (two) times daily with a meal.  Dispense: 60 tablet; Refill: 0 - Ambulatory referral to Sports Medicine  2. Rash and nonspecific skin eruption Already healing per patient. Continue neosporin to the areas that are open from scratching - try to avoid scratching. Start with OTC topical steroid cream for itchy rashes. Pat dry after bathing and apply lotion frequently to prevent skin over-drying.    Follow-up as needed; after seeing specialist or sooner if needed.   Terrilyn Saver, NP

## 2021-03-11 NOTE — Patient Instructions (Addendum)
Start naproxen 500 mg twice a day - do NOT take with any other antiinflammatories (ibuprofen, Aleve, etc.) You can continue occasional tylenol if needed Ice/heat for comfort a few times per day Home exercises/stretches - handout provided Xray of left shoulder and neck today Placing referral to sports medicine for further evaluation if needed and consider injection depending on xray results. Refilling your Voltaren gel - you can try this as well

## 2021-03-14 ENCOUNTER — Other Ambulatory Visit: Payer: Self-pay | Admitting: *Deleted

## 2021-03-14 MED ORDER — LORAZEPAM 1 MG PO TABS: 1.0000 mg | ORAL_TABLET | ORAL | 0 refills | Status: DC | PRN

## 2021-03-18 ENCOUNTER — Ambulatory Visit
Admission: RE | Admit: 2021-03-18 | Discharge: 2021-03-18 | Disposition: A | Discharge status: Home / Self Care | Payer: Medicaid Other | Source: Ambulatory Visit | Attending: Oncology | Admitting: Oncology

## 2021-03-18 ENCOUNTER — Other Ambulatory Visit: Payer: Self-pay

## 2021-03-18 DIAGNOSIS — Z1509 Genetic susceptibility to other malignant neoplasm: Secondary | ICD-10-CM

## 2021-03-25 ENCOUNTER — Other Ambulatory Visit (HOSPITAL_COMMUNITY): Payer: Medicaid Other

## 2021-03-31 ENCOUNTER — Encounter (HOSPITAL_COMMUNITY): Payer: Self-pay

## 2021-03-31 ENCOUNTER — Other Ambulatory Visit: Payer: Self-pay | Admitting: *Deleted

## 2021-03-31 ENCOUNTER — Other Ambulatory Visit: Payer: Self-pay

## 2021-03-31 ENCOUNTER — Ambulatory Visit (HOSPITAL_COMMUNITY): Admission: RE | Admit: 2021-03-31 | Payer: Medicaid Other | Source: Ambulatory Visit

## 2021-03-31 IMAGING — MR MR BREAST BILAT WO/W CM
8 of 12 series · 29 of 48 positions shown · IV contrast (gadavist)
Comparison: Previous exams including MRI breast 12/10/2018 and MRI
breast 01/10/2019

CLINICAL DATA: Patient with elevated lifetime risk breast cancer.
High risk screening exam. Patient has a strong family history of
breast cancer and is positive for PALB 2 gene mutation

EXAM:
BILATERAL BREAST MRI WITH AND WITHOUT CONTRAST
TECHNIQUE: Multiplanar, multisequence MR images of both breasts were obtained
prior to and following the intravenous administration of 10 ml of
Gadavist

[Series 2: t2_tirm_tra ipat (a-p) · axial · 3.0mm · 0.70mm/px · 1 of 81 slices shown]
[im 1/81]
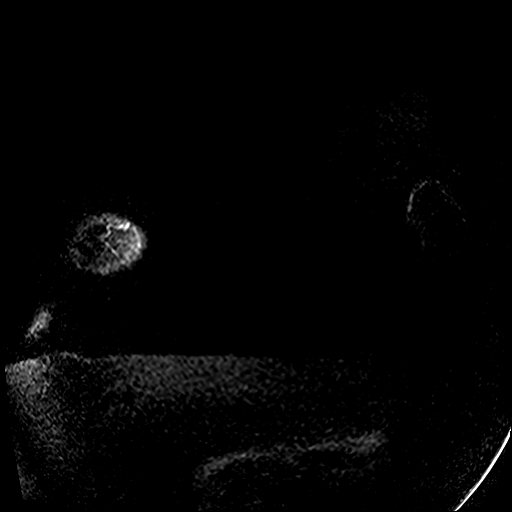

[Series 3: fl3d pre-cm no · axial · non-contrast · 1.2mm · 0.94mm/px · z∈[-152,+135]mm · 5 of 237 slices shown]
[im 1/237]
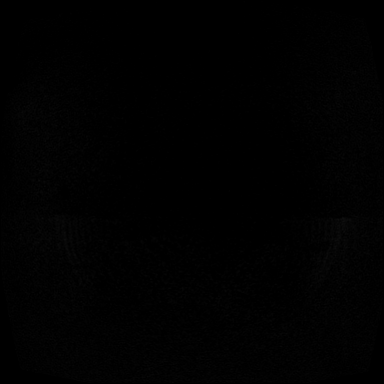
[im 60/237]
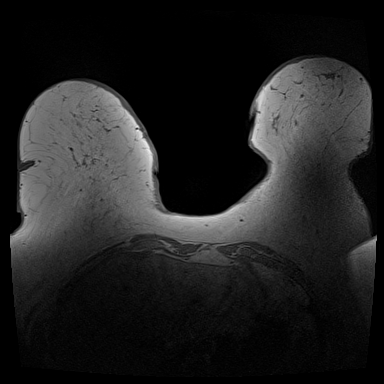
[im 119/237]
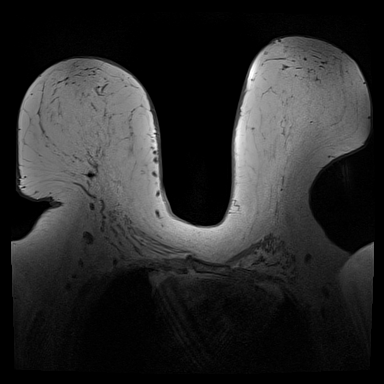
[im 178/237]
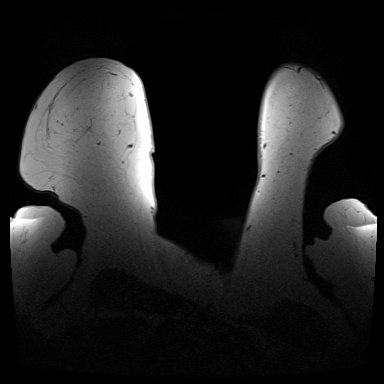
[im 237/237]
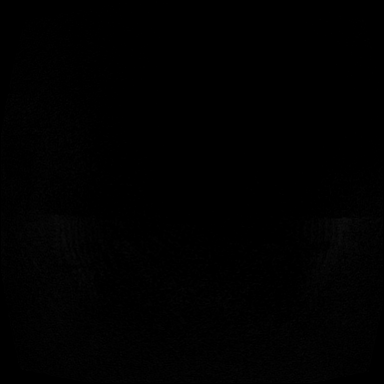

[Series 4: fl3d pre-cm · axial · non-contrast · 1.2mm · 0.87mm/px · z∈[-152,+135]mm · 5 of 240 slices shown]
[im 1/240]
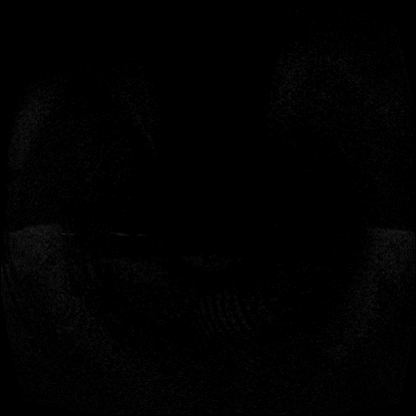
[im 60/240]
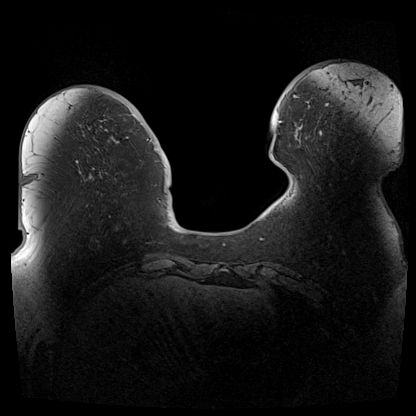
[im 120/240]
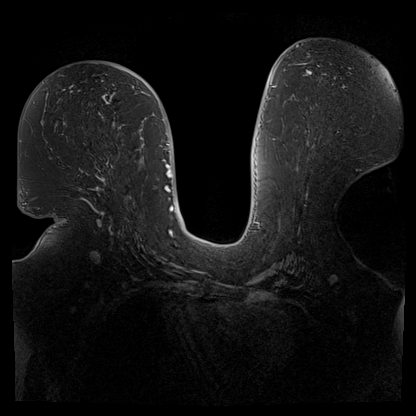
[im 180/240]
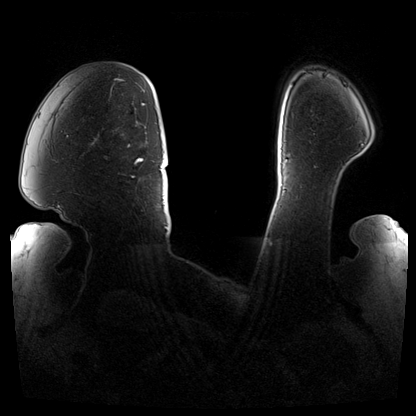
[im 240/240]
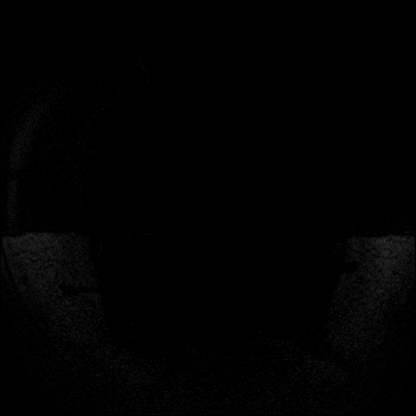

[Series 5: fl3d post-cm 20 · axial · 1.2mm · 0.87mm/px · z∈[-152,+135]mm · 5 of 240 slices shown (1 of 3)]
[im 1/240]
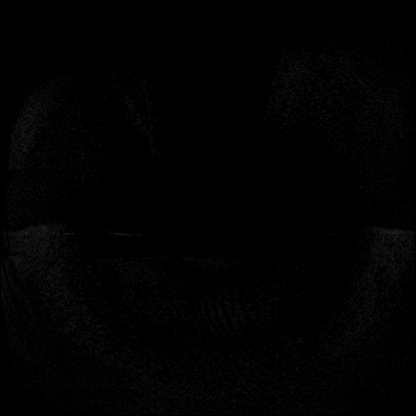
[im 60/240]
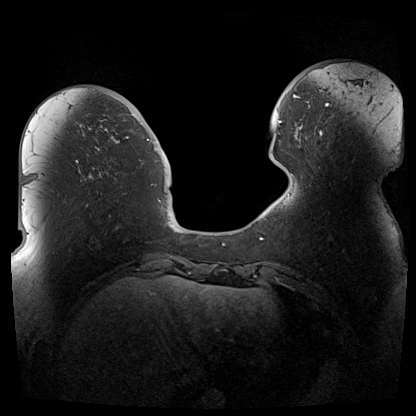
[im 120/240]
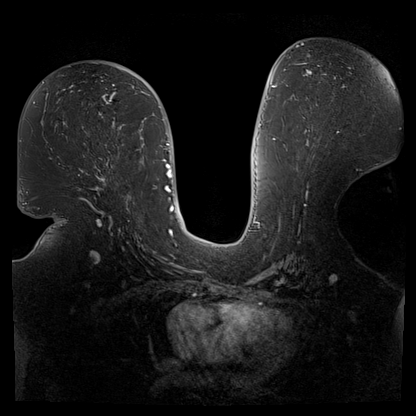
[im 180/240]
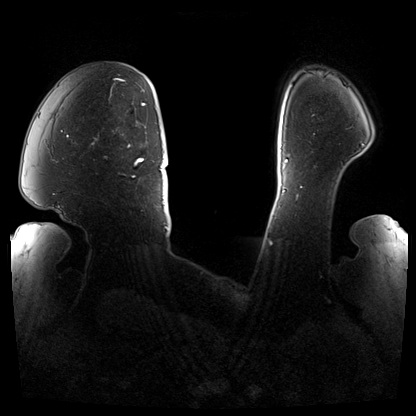
[im 240/240]
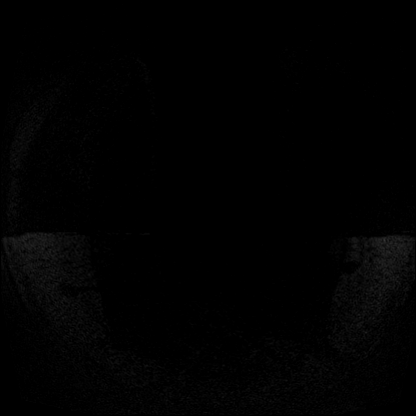

[Series 6: fl3d post-cm 20 · axial · 1.2mm · 0.87mm/px · z∈[-152,+135]mm · 5 of 240 slices shown (2 of 3)]
[im 1/240]
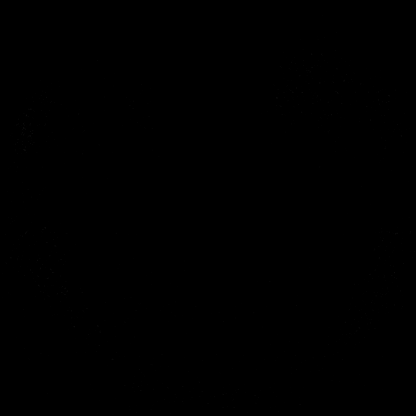
[im 60/240]
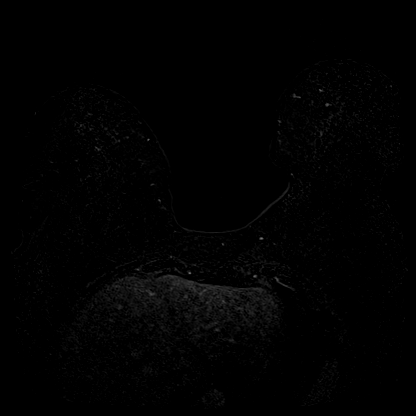
[im 120/240]
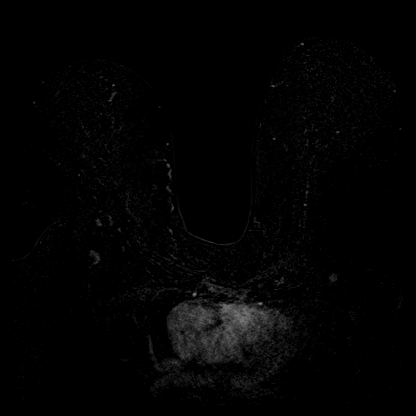
[im 180/240]
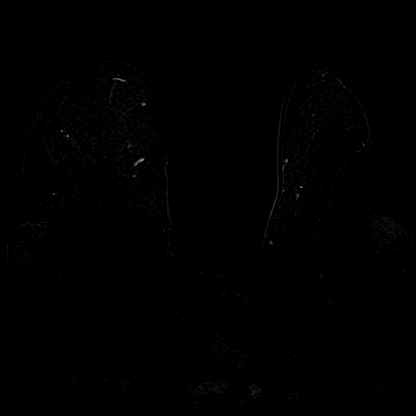
[im 240/240]
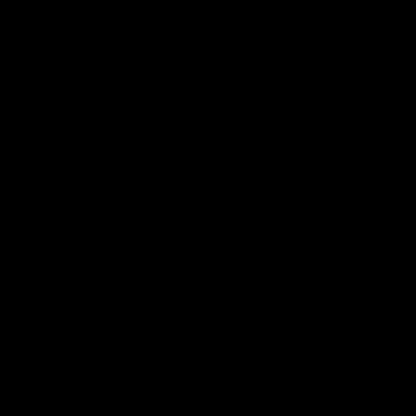

[Series 7: fl3d post-cm 20 · axial · 288.0mm · 0.87mm/px · 1 of 1 slices shown (3 of 3)]
[im 1/1]
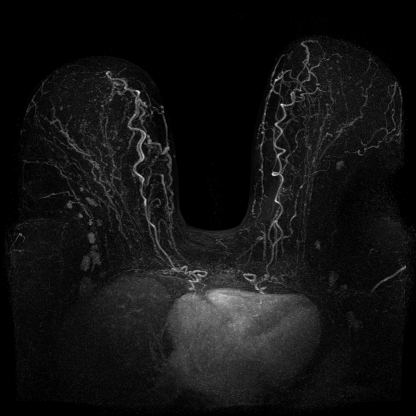

[Series 8: fl3d post-cm 3min · axial · 1.2mm · 0.87mm/px · z∈[-152,+135]mm · 6 of 240 slices shown]
[im 1/240]
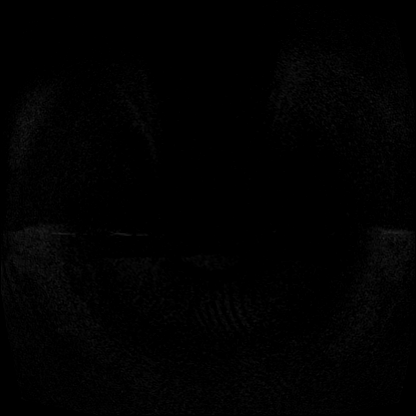
[im 48/240]
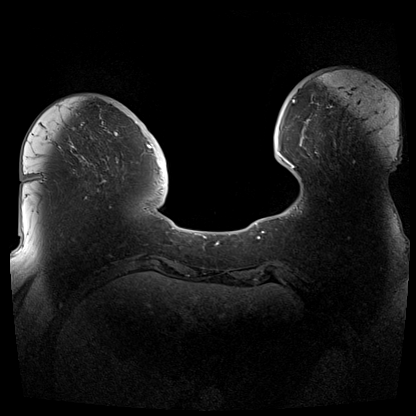
[im 96/240]
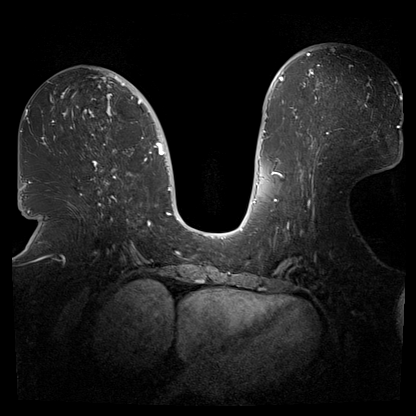
[im 144/240]
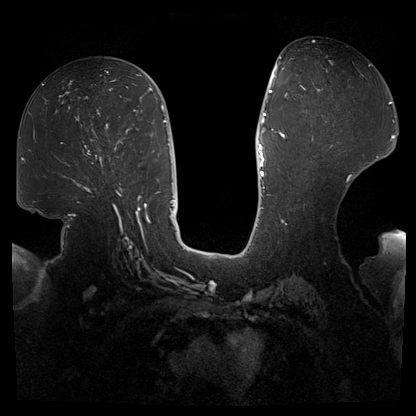
[im 192/240]
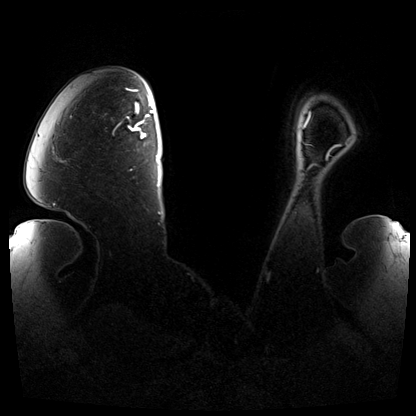
[im 240/240]
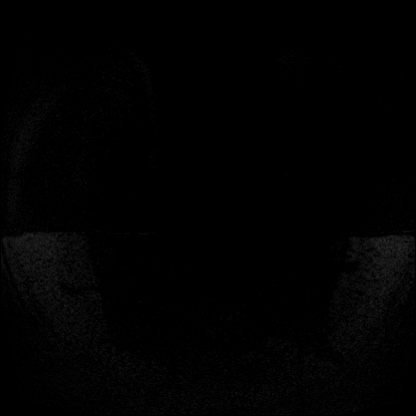

[Series 9: fl3d post-cm 3min_sub · axial · 1.2mm · 0.87mm/px · 1 of 240 slices shown]
[im 1/240]
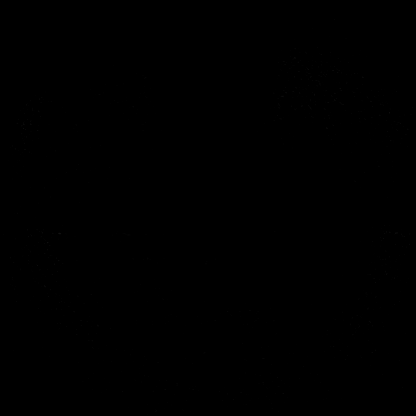

[29 of 48 positions shown; findings below may reference images not displayed]

Three-dimensional MR images were rendered by post-processing of the
original MR data on an independent workstation. The
three-dimensional MR images were interpreted, and findings are
reported in the following complete MRI report for this study. Three
dimensional images were evaluated at the independent interpreting
workstation using the DynaCAD thin client.
FINDINGS: Breast composition: a. Almost entirely fat.

Background parenchymal enhancement: Minimal

Right breast: Within the posteroinferior right breast there are 2
adjacent lobular enhancing 5 mm masses (image 150; series 6). No
additional suspicious areas of enhancement identified within the
right breast.

Left breast: No mass or abnormal enhancement.

Lymph nodes: Suggestion of at least 1 prominent/mildly enlarged
right axillary lymph node measuring up to 11 mm (image 106; series
3).

Ancillary findings:  None.
IMPRESSION: 1. Two adjacent indeterminate 5 mm enhancing masses within the
inferior right breast.
2. Suggestion of at least 1 cortically thickened right axillary
lymph node.

RECOMMENDATION:
1. MRI guided core needle biopsy of the 2 adjacent 5 mm masses
within the inferior right breast.
2. Second-look ultrasound of the right axilla. If any enlarged right
axillary lymph nodes are identified, consider further evaluation
with percutaneous biopsy.

BI-RADS CATEGORY  4: Suspicious.

## 2021-03-31 NOTE — Progress Notes (Signed)
IV  

## 2021-04-02 ENCOUNTER — Ambulatory Visit: Payer: Medicaid Other | Admitting: Family Medicine

## 2021-04-04 ENCOUNTER — Other Ambulatory Visit: Payer: Self-pay

## 2021-04-04 ENCOUNTER — Ambulatory Visit
Admission: RE | Admit: 2021-04-04 | Discharge: 2021-04-04 | Disposition: A | Discharge status: Home / Self Care | Payer: Medicaid Other | Source: Ambulatory Visit | Attending: Oncology | Admitting: Oncology

## 2021-04-04 ENCOUNTER — Inpatient Hospital Stay: Payer: Medicaid Other | Attending: Oncology

## 2021-04-04 DIAGNOSIS — Z1289 Encounter for screening for malignant neoplasm of other sites: Secondary | ICD-10-CM | POA: Diagnosis not present

## 2021-04-04 MED ORDER — GADOBUTROL 1 MMOL/ML IV SOLN
10.0000 mL | Freq: Once | INTRAVENOUS | Status: AC | PRN
  Administered 2021-04-04: 10 mL via INTRAVENOUS

## 2021-04-04 NOTE — Progress Notes (Signed)
Patient presented to infusion clinic today for insertion of PIV for upcoming MRI. 22g IV placed in right posterior forearm without issue. "Pigtail" line attached, good blood return and flushes easily. Secured with tape, transparent dressing, and Coban.

## 2021-04-07 ENCOUNTER — Encounter: Payer: Self-pay | Admitting: Hematology and Oncology

## 2021-04-08 ENCOUNTER — Other Ambulatory Visit: Payer: Self-pay | Admitting: Hematology and Oncology

## 2021-04-08 ENCOUNTER — Other Ambulatory Visit: Payer: Self-pay | Admitting: Oncology

## 2021-04-08 DIAGNOSIS — Z1509 Genetic susceptibility to other malignant neoplasm: Secondary | ICD-10-CM

## 2021-04-08 DIAGNOSIS — Z1501 Genetic susceptibility to malignant neoplasm of breast: Secondary | ICD-10-CM

## 2021-04-11 ENCOUNTER — Telehealth: Payer: Self-pay | Admitting: Hematology and Oncology

## 2021-04-11 NOTE — Telephone Encounter (Signed)
Sch per 2/15 inbasket, pt aware of scheduled and updated appts

## 2021-04-16 ENCOUNTER — Telehealth: Payer: Self-pay | Admitting: *Deleted

## 2021-04-16 ENCOUNTER — Other Ambulatory Visit: Payer: Self-pay | Admitting: *Deleted

## 2021-04-16 DIAGNOSIS — Z1239 Encounter for other screening for malignant neoplasm of breast: Secondary | ICD-10-CM

## 2021-04-16 NOTE — Telephone Encounter (Signed)
Pt due for MRI of breast due to known BRCA + status with high risk for developing breast cancer. [facility] states they have had difficulty obtaining an IV on the patient per prior appt and request this office to insert peripheal IV for use.  Appt made per above and order placed.  Note pt may be d/ed from this facility with IV intact for [facility] to use for MRI and then remove post procedure.

## 2021-04-17 ENCOUNTER — Other Ambulatory Visit: Payer: Self-pay

## 2021-04-17 ENCOUNTER — Ambulatory Visit
Admission: RE | Admit: 2021-04-17 | Discharge: 2021-04-17 | Disposition: A | Discharge status: Home / Self Care | Payer: Medicaid Other | Source: Ambulatory Visit | Attending: Hematology and Oncology | Admitting: Hematology and Oncology

## 2021-04-17 ENCOUNTER — Inpatient Hospital Stay: Payer: Medicaid Other

## 2021-04-17 DIAGNOSIS — Z1501 Genetic susceptibility to malignant neoplasm of breast: Secondary | ICD-10-CM

## 2021-04-17 DIAGNOSIS — Z1509 Genetic susceptibility to other malignant neoplasm: Secondary | ICD-10-CM

## 2021-04-17 NOTE — Progress Notes (Signed)
22g IV placed. Blood returns easily, flushes easily. Dressing secured with tape. Piggytail attached and secured. Patient left facility with IV intact and working.

## 2021-04-22 ENCOUNTER — Inpatient Hospital Stay: Admission: RE | Admit: 2021-04-22 | Payer: Medicaid Other | Source: Ambulatory Visit

## 2021-04-28 ENCOUNTER — Other Ambulatory Visit: Payer: Self-pay | Admitting: Pulmonary Disease

## 2021-05-08 ENCOUNTER — Ambulatory Visit: Payer: Medicaid Other | Admitting: Physician Assistant

## 2021-05-08 ENCOUNTER — Telehealth: Payer: Self-pay | Admitting: Physician Assistant

## 2021-05-08 NOTE — Telephone Encounter (Signed)
Patient called today to cancel her 10 am apt with Alyssa. Patient called at 9:22 am for 10am apt.  ? ?Patient was rude and proceeded to insult this caller as this caller mentioned there will be a $50 fee for a No Show No Call 24hr prior.- Patient then hung up call before this caller could get her to reschedule.  ? ? ?

## 2021-05-08 NOTE — Telephone Encounter (Signed)
Noted. Will refer to office manager for next steps.  ?

## 2021-06-05 ENCOUNTER — Encounter: Payer: Self-pay | Admitting: Physician Assistant

## 2021-06-05 NOTE — Telephone Encounter (Signed)
Please enforce discharge papers per Provider.  ? ?Thanks!  ?

## 2021-06-06 NOTE — Telephone Encounter (Signed)
Message sent thru MyChart 

## 2021-06-11 NOTE — Telephone Encounter (Signed)
Please schedule app with pt. ° °Thanks!  °

## 2021-06-16 ENCOUNTER — Encounter: Payer: Self-pay | Admitting: Physician Assistant

## 2021-06-18 NOTE — Telephone Encounter (Signed)
Patient scheduled for 06/19/2021

## 2021-06-19 ENCOUNTER — Telehealth (INDEPENDENT_AMBULATORY_CARE_PROVIDER_SITE_OTHER): Payer: Medicaid Other | Admitting: Physician Assistant

## 2021-06-19 ENCOUNTER — Encounter: Payer: Self-pay | Admitting: Physician Assistant

## 2021-06-19 ENCOUNTER — Other Ambulatory Visit: Payer: Self-pay | Admitting: Pulmonary Disease

## 2021-06-19 VITALS — Ht 63.5 in | Wt 296.5 lb

## 2021-06-19 DIAGNOSIS — F32A Depression, unspecified: Secondary | ICD-10-CM | POA: Diagnosis not present

## 2021-06-19 DIAGNOSIS — R454 Irritability and anger: Secondary | ICD-10-CM

## 2021-06-19 DIAGNOSIS — F419 Anxiety disorder, unspecified: Secondary | ICD-10-CM | POA: Diagnosis not present

## 2021-06-19 MED ORDER — ESCITALOPRAM OXALATE 10 MG PO TABS: 10.0000 mg | ORAL_TABLET | Freq: Every day | ORAL | 2 refills | Status: DC

## 2021-06-19 NOTE — Patient Instructions (Signed)
If you develop suicidal thoughts, please tell someone and immediately proceed to our local 24/7 crisis center, [facility]. [address], [place], [place] [address] [phone].  
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
## 2021-07-12 ENCOUNTER — Other Ambulatory Visit: Payer: Self-pay | Admitting: Physician Assistant

## 2021-07-15 DIAGNOSIS — R7303 Prediabetes: Secondary | ICD-10-CM | POA: Diagnosis not present

## 2021-07-15 DIAGNOSIS — Z7189 Other specified counseling: Secondary | ICD-10-CM | POA: Diagnosis not present

## 2021-07-15 DIAGNOSIS — Z6841 Body Mass Index (BMI) 40.0 and over, adult: Secondary | ICD-10-CM | POA: Diagnosis not present

## 2021-07-15 DIAGNOSIS — E668 Other obesity: Secondary | ICD-10-CM | POA: Diagnosis not present

## 2021-07-15 DIAGNOSIS — Z7689 Persons encountering health services in other specified circumstances: Secondary | ICD-10-CM | POA: Diagnosis not present

## 2021-07-16 ENCOUNTER — Other Ambulatory Visit: Payer: Self-pay | Admitting: Pulmonary Disease

## 2021-07-22 ENCOUNTER — Ambulatory Visit: Payer: Medicaid Other | Admitting: Adult Health

## 2021-07-23 ENCOUNTER — Other Ambulatory Visit: Payer: Self-pay

## 2021-07-23 ENCOUNTER — Encounter (HOSPITAL_COMMUNITY): Payer: Self-pay

## 2021-07-23 ENCOUNTER — Emergency Department (HOSPITAL_COMMUNITY)
Admission: EM | Admit: 2021-07-23 | Discharge: 2021-07-25 | Disposition: A | Discharge status: Home / Self Care | Payer: Medicaid Other | Attending: Emergency Medicine | Admitting: Emergency Medicine

## 2021-07-23 DIAGNOSIS — R140 Abdominal distension (gaseous): Secondary | ICD-10-CM | POA: Diagnosis not present

## 2021-07-23 DIAGNOSIS — R0689 Other abnormalities of breathing: Secondary | ICD-10-CM | POA: Diagnosis not present

## 2021-07-23 DIAGNOSIS — R1013 Epigastric pain: Secondary | ICD-10-CM | POA: Insufficient documentation

## 2021-07-23 DIAGNOSIS — T50902A Poisoning by unspecified drugs, medicaments and biological substances, intentional self-harm, initial encounter: Secondary | ICD-10-CM

## 2021-07-23 DIAGNOSIS — R45851 Suicidal ideations: Secondary | ICD-10-CM | POA: Insufficient documentation

## 2021-07-23 DIAGNOSIS — Z20822 Contact with and (suspected) exposure to covid-19: Secondary | ICD-10-CM | POA: Insufficient documentation

## 2021-07-23 DIAGNOSIS — F332 Major depressive disorder, recurrent severe without psychotic features: Secondary | ICD-10-CM | POA: Insufficient documentation

## 2021-07-23 DIAGNOSIS — I10 Essential (primary) hypertension: Secondary | ICD-10-CM | POA: Diagnosis not present

## 2021-07-23 DIAGNOSIS — T43292A Poisoning by other antidepressants, intentional self-harm, initial encounter: Secondary | ICD-10-CM | POA: Diagnosis not present

## 2021-07-23 DIAGNOSIS — R457 State of emotional shock and stress, unspecified: Secondary | ICD-10-CM | POA: Diagnosis not present

## 2021-07-23 LAB — RAPID URINE DRUG SCREEN, HOSP PERFORMED
Amphetamines: NOT DETECTED
Barbiturates: NOT DETECTED
Benzodiazepines: NOT DETECTED
Cocaine: NOT DETECTED
Opiates: NOT DETECTED
Tetrahydrocannabinol: NOT DETECTED

## 2021-07-23 LAB — COMPREHENSIVE METABOLIC PANEL
ALT: 12 U/L (ref 0–44)
AST: 21 U/L (ref 15–41)
Albumin: 4 g/dL (ref 3.5–5.0)
Alkaline Phosphatase: 72 U/L (ref 38–126)
Anion gap: 9 (ref 5–15)
BUN: 11 mg/dL (ref 6–20)
CO2: 24 mmol/L (ref 22–32)
Calcium: 9.4 mg/dL (ref 8.9–10.3)
Chloride: 108 mmol/L (ref 98–111)
Creatinine, Ser: 0.85 mg/dL (ref 0.44–1.00)
GFR, Estimated: >60 mL/min (ref >60)
Glucose, Bld: 52 mg/dL — ABNORMAL LOW (ref 70–99)
Potassium: 4.5 mmol/L (ref 3.5–5.1)
Sodium: 141 mmol/L (ref 135–145)
Total Bilirubin: 1 mg/dL (ref 0.3–1.2)
Total Protein: 8.2 g/dL — ABNORMAL HIGH (ref 6.5–8.1)

## 2021-07-23 LAB — RESP PANEL BY RT-PCR (FLU A&B, COVID) ARPGX2
Influenza A by PCR: NEGATIVE
Influenza B by PCR: NEGATIVE
SARS Coronavirus 2 by RT PCR: NEGATIVE

## 2021-07-23 LAB — CBC WITH DIFFERENTIAL/PLATELET
Abs Immature Granulocytes: 0.03 10*3/uL (ref 0.00–0.07)
Basophils Absolute: 0.1 10*3/uL (ref 0.0–0.1)
Basophils Relative: 1 %
Eosinophils Absolute: 0.1 10*3/uL (ref 0.0–0.5)
Eosinophils Relative: 1 %
HCT: 44.5 % (ref 36.0–46.0)
Hemoglobin: 14.1 g/dL (ref 12.0–15.0)
Immature Granulocytes: 0 %
Lymphocytes Relative: 22 %
Lymphs Abs: 2.3 10*3/uL (ref 0.7–4.0)
MCH: 29.6 pg (ref 26.0–34.0)
MCHC: 31.7 g/dL (ref 30.0–36.0)
MCV: 93.3 fL (ref 80.0–100.0)
Monocytes Absolute: 0.5 10*3/uL (ref 0.1–1.0)
Monocytes Relative: 5 %
Neutro Abs: 7.3 10*3/uL (ref 1.7–7.7)
Neutrophils Relative %: 71 %
Platelets: 280 10*3/uL (ref 150–400)
RBC: 4.77 MIL/uL (ref 3.87–5.11)
RDW: 14.1 % (ref 11.5–15.5)
WBC: 10.3 10*3/uL (ref 4.0–10.5)
nRBC: 0 % (ref 0.0–0.2)

## 2021-07-23 LAB — ACETAMINOPHEN LEVEL: Acetaminophen (Tylenol), Serum: <10 ug/mL — ABNORMAL LOW (ref 10–30)

## 2021-07-23 LAB — SALICYLATE LEVEL: Salicylate Lvl: <7 mg/dL — ABNORMAL LOW (ref 7.0–30.0)

## 2021-07-23 LAB — ETHANOL: Alcohol, Ethyl (B): <10 mg/dL (ref <10)

## 2021-07-23 LAB — LIPASE, BLOOD: Lipase: 28 U/L (ref 11–51)

## 2021-07-23 LAB — HCG, QUANTITATIVE, PREGNANCY: hCG, Beta Chain, Quant, S: 1 m[IU]/mL (ref <5)

## 2021-07-23 LAB — CBG MONITORING, ED: Glucose-Capillary: 111 mg/dL — ABNORMAL HIGH (ref 70–99)

## 2021-07-23 MED ORDER — ALUM & MAG HYDROXIDE-SIMETH 200-200-20 MG/5ML PO SUSP
30.0000 mL | Freq: Once | ORAL | Status: AC
  Administered 2021-07-23: 30 mL via ORAL
  Filled 2021-07-23: qty 30

## 2021-07-23 NOTE — ED Notes (Signed)
Patient is resting comfortably. 

## 2021-07-23 NOTE — Progress Notes (Signed)
Inpatient Behavioral Health Placement  Pt meets inpatient criteria per Lindon Romp, NP. There are no available beds at [facility].Referral was sent to the following facilities;    Destination Service Provider Address Phone [facility]  [address] [place] [address] [phone] [place]  [address], [place] [address] [phone] [facility]  [address] [place] [address] [phone] [facility]  [place], [place] [address] [phone]  [facility]  [address] [place] [address] [phone]  [facility]  [place], [place] [address] [phone]  [facility]  [address], [place] [address] [phone]  [place] [place] [address] [phone]  [facility]  [place] [address] [phone]  [facility]  [address] [place] [address] [phone] [facility]  [address], [place] [address] [phone] [place]  [address], [place] [address] [phone] [facility]  [address] [place] [address] [phone]  [facility]  [place], [place] [address] [phone]  [facility]  [address] [place] [address] [facility]  [address], [place] [address] [phone]  [facility]  [address], [place] [address]  [phone]   Situation ongoing,  CSW will follow up.   Benjaman Kindler, MSW, LCSWA 07/23/2021  @ 11:01 PM
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
## 2021-07-24 MED ORDER — ESCITALOPRAM OXALATE 10 MG PO TABS
10.0000 mg | ORAL_TABLET | Freq: Every day | ORAL | Status: DC
  Administered 2021-07-24 – 2021-07-25 (×2): 10 mg via ORAL
  Filled 2021-07-24 (×3): qty 1

## 2021-07-24 MED ORDER — ACETAMINOPHEN 325 MG PO TABS
650.0000 mg | ORAL_TABLET | Freq: Three times a day (TID) | ORAL | Status: DC | PRN
Start: 2021-07-24 — End: 2021-07-25

## 2021-07-24 MED ORDER — ACETAMINOPHEN 325 MG PO TABS
650.0000 mg | ORAL_TABLET | Freq: Once | ORAL | Status: AC
  Administered 2021-07-24: 650 mg via ORAL
  Filled 2021-07-24: qty 2

## 2021-07-24 MED ORDER — MOMETASONE FURO-FORMOTEROL FUM 200-5 MCG/ACT IN AERO
2.0000 | INHALATION_SPRAY | Freq: Two times a day (BID) | RESPIRATORY_TRACT | Status: DC
  Administered 2021-07-25: 2 via RESPIRATORY_TRACT
  Filled 2021-07-24: qty 8.8

## 2021-07-24 MED ORDER — ROSUVASTATIN CALCIUM 10 MG PO TABS
10.0000 mg | ORAL_TABLET | Freq: Every day | ORAL | Status: DC
  Administered 2021-07-24 – 2021-07-25 (×2): 10 mg via ORAL
  Filled 2021-07-24 (×3): qty 1

## 2021-07-24 MED ORDER — LORAZEPAM 1 MG PO TABS
1.0000 mg | ORAL_TABLET | ORAL | Status: DC | PRN
Start: 2021-07-24 — End: 2021-07-25

## 2021-07-24 MED ORDER — PHENTERMINE HCL 37.5 MG PO TABS: 18.7500 mg | ORAL_TABLET | Freq: Every morning | ORAL | Status: DC

## 2021-07-24 NOTE — ED Provider Notes (Signed)
Emergency Medicine Observation Re-evaluation Note  Dawn Galvan is a 46 y.o. female, seen on rounds today.  Pt initially presented to the ED for complaints of Ingestion and Suicide Attempt (Unsuccessful attempt to ingestion large quantity of Lexapro. Son intervened. ) Currently, the patient is resting quietly.  Physical Exam  BP 125/69 (BP Location: Right Arm)   Pulse 73   Temp 98.7 F (37.1 C) (Oral)   Resp 18   SpO2 99%  Physical Exam General: No acute distress Cardiac: Well-perfused Lungs: Nonlabored Psych: Cooperative  ED Course / MDM  EKG:   I have reviewed the labs performed to date as well as medications administered while in observation.  Recent changes in the last 24 hours include psychiatric assessment.  Plan  Current plan is for inpatient psychiatric bed.  Dawn Galvan is not under involuntary commitment.     Dawn Files, MD 07/24/21 1730

## 2021-07-24 NOTE — ED Notes (Signed)
Pt currently sleeping, respirations equal & unlabored.  ?

## 2021-07-24 NOTE — ED Notes (Signed)
Pt. Consented to transfer to [facility]

## 2021-07-24 NOTE — Progress Notes (Signed)
Patient has been denied by [facility] due to no appropriate beds available. Patient meets BH inpatient criteria per Nira Conn, NP. Patient has been faxed out to the following facilities:   [facility]  [address] [place] [place] [address] [phone]  [facility]  [address], [place] [address] [phone]  [facility]  [address] [place] [address] [phone]  [facility]  [address] [place], [place] [address] [phone]  [facility]  [address] [place] [address] [phone]  [facility]  [address] [place], [place] [address] [phone]  [facility]  [address] [place] [address] [phone]  [facility]  [address] [place] [address] [phone]  [facility]  [address] [place] [address] [phone]  [facility]  [address], [place] [address] [phone]  [facility]  [address], [place] [address] [phone]  [facility]  [address], [place] [address] [phone]  [facility]  [address] [place] [address] [phone]  [facility]  [address] [place] [address] [phone]  [facility]  [address] [place] [address] [phone]  [facility]  [address], [place] [address] [phone]  [facility]  [address] [place] [address], [place] [address] [phone]  Damita Dunnings, MSW,  LCSW-A  3:05 PM 07/24/2021

## 2021-07-24 NOTE — Progress Notes (Signed)
[facility] LCSW Progress Note   07/24/2021    3:23 PM  [place]   [id number]   Type of Contact and Topic:  Psychiatric Bed Placement   Pt accepted to [place] [facility] [place]   Patient meets inpatient criteria per Lindon Romp, NP  The attending provider will be Dr. Alcide Clever   Call report to [phone]  Edward Qualia, RN @ [facility] ED notified.     Pt scheduled  to arrive at [place] 0900.   Mariea Clonts, MSW, LCSW-A  3:34 PM 07/24/2021

## 2021-07-24 NOTE — ED Notes (Signed)
Pt states she will not eat her breakfast and would like a salad instead. Explained that our trays are generic for all pt's. Pt voice began to get louder as she requested a supervisor. Lillia Abed, CN to bedside.

## 2021-07-25 DIAGNOSIS — Z62811 Personal history of psychological abuse in childhood: Secondary | ICD-10-CM | POA: Diagnosis not present

## 2021-07-25 DIAGNOSIS — M069 Rheumatoid arthritis, unspecified: Secondary | ICD-10-CM | POA: Diagnosis not present

## 2021-07-25 DIAGNOSIS — E785 Hyperlipidemia, unspecified: Secondary | ICD-10-CM | POA: Diagnosis not present

## 2021-07-25 DIAGNOSIS — Z62810 Personal history of physical and sexual abuse in childhood: Secondary | ICD-10-CM | POA: Diagnosis not present

## 2021-07-25 DIAGNOSIS — J45909 Unspecified asthma, uncomplicated: Secondary | ICD-10-CM | POA: Diagnosis not present

## 2021-07-25 DIAGNOSIS — K219 Gastro-esophageal reflux disease without esophagitis: Secondary | ICD-10-CM | POA: Diagnosis not present

## 2021-07-25 DIAGNOSIS — F331 Major depressive disorder, recurrent, moderate: Secondary | ICD-10-CM | POA: Diagnosis not present

## 2021-07-25 DIAGNOSIS — F332 Major depressive disorder, recurrent severe without psychotic features: Secondary | ICD-10-CM | POA: Diagnosis not present

## 2021-07-25 NOTE — ED Notes (Signed)
Pt agrees to go to [facility] voluntarily. Pt says she does not need to be IVC'd.

## 2021-07-25 NOTE — ED Notes (Signed)
Returned pt belonging bag. Pt confirms all belongings are present. Black slides, grey hoodie, jumper, white t-shirt, pink bonnet.

## 2021-07-28 ENCOUNTER — Ambulatory Visit: Payer: Medicaid Other | Admitting: Adult Health

## 2021-07-28 DIAGNOSIS — J45909 Unspecified asthma, uncomplicated: Secondary | ICD-10-CM | POA: Diagnosis not present

## 2021-07-28 DIAGNOSIS — Z62810 Personal history of physical and sexual abuse in childhood: Secondary | ICD-10-CM | POA: Diagnosis not present

## 2021-07-28 DIAGNOSIS — K219 Gastro-esophageal reflux disease without esophagitis: Secondary | ICD-10-CM | POA: Diagnosis not present

## 2021-07-28 DIAGNOSIS — E785 Hyperlipidemia, unspecified: Secondary | ICD-10-CM | POA: Diagnosis not present

## 2021-07-28 DIAGNOSIS — M069 Rheumatoid arthritis, unspecified: Secondary | ICD-10-CM | POA: Diagnosis not present

## 2021-07-28 DIAGNOSIS — F332 Major depressive disorder, recurrent severe without psychotic features: Secondary | ICD-10-CM | POA: Diagnosis not present

## 2021-07-28 DIAGNOSIS — F331 Major depressive disorder, recurrent, moderate: Secondary | ICD-10-CM | POA: Diagnosis not present

## 2021-07-28 DIAGNOSIS — Z62811 Personal history of psychological abuse in childhood: Secondary | ICD-10-CM | POA: Diagnosis not present

## 2021-08-04 DIAGNOSIS — Z6841 Body Mass Index (BMI) 40.0 and over, adult: Secondary | ICD-10-CM | POA: Diagnosis not present

## 2021-08-04 DIAGNOSIS — R7303 Prediabetes: Secondary | ICD-10-CM | POA: Diagnosis not present

## 2021-08-04 DIAGNOSIS — Z7689 Persons encountering health services in other specified circumstances: Secondary | ICD-10-CM | POA: Diagnosis not present

## 2021-08-04 DIAGNOSIS — E7849 Other hyperlipidemia: Secondary | ICD-10-CM | POA: Diagnosis not present

## 2021-08-04 DIAGNOSIS — E668 Other obesity: Secondary | ICD-10-CM | POA: Diagnosis not present

## 2021-08-04 DIAGNOSIS — Z7189 Other specified counseling: Secondary | ICD-10-CM | POA: Diagnosis not present

## 2021-08-05 ENCOUNTER — Ambulatory Visit (INDEPENDENT_AMBULATORY_CARE_PROVIDER_SITE_OTHER): Payer: Medicaid Other | Admitting: Adult Health

## 2021-08-05 ENCOUNTER — Ambulatory Visit (INDEPENDENT_AMBULATORY_CARE_PROVIDER_SITE_OTHER): Payer: Medicaid Other

## 2021-08-05 ENCOUNTER — Encounter: Payer: Self-pay | Admitting: Adult Health

## 2021-08-05 VITALS — BP 108/80 | HR 72 | Temp 98.2°F | Ht 64.0 in | Wt 296.0 lb

## 2021-08-05 DIAGNOSIS — J984 Other disorders of lung: Secondary | ICD-10-CM | POA: Diagnosis not present

## 2021-08-05 DIAGNOSIS — J309 Allergic rhinitis, unspecified: Secondary | ICD-10-CM

## 2021-08-05 DIAGNOSIS — J4530 Mild persistent asthma, uncomplicated: Secondary | ICD-10-CM

## 2021-08-05 DIAGNOSIS — J45909 Unspecified asthma, uncomplicated: Secondary | ICD-10-CM | POA: Diagnosis not present

## 2021-08-05 LAB — NITRIC OXIDE: FeNO level (ppb): 11

## 2021-08-05 MED ORDER — FLUTICASONE PROPIONATE 50 MCG/ACT NA SUSP: NASAL | 11 refills | Status: DC

## 2021-08-05 MED ORDER — ALBUTEROL SULFATE HFA 108 (90 BASE) MCG/ACT IN AERS: 1.0000 | INHALATION_SPRAY | Freq: Four times a day (QID) | RESPIRATORY_TRACT | 2 refills | Status: DC | PRN

## 2021-08-05 MED ORDER — FLUTICASONE-SALMETEROL 250-50 MCG/ACT IN AEPB: INHALATION_SPRAY | RESPIRATORY_TRACT | 11 refills | Status: DC

## 2021-08-05 NOTE — Patient Instructions (Addendum)
Continue on Wixela inhaler 1 puff twice daily, rinse after use Albuterol inhaler as needed for wheezing May use Zyrtec 10 mg daily as needed for drainage Flonase daily as needed  Chest xray today.  Follow up with Dr. Isaiah Serge in 1 year and As needed

## 2021-08-05 NOTE — Progress Notes (Signed)
'@Patient'  ID: Dawn Galvan, female    DOB: 08-18-1975, 46 y.o.   MRN: [id number]  Chief Complaint  Patient presents with   Follow-up    Referring provider: Allwardt, Nicholes Rough  HPI: 46 year old female minimal smoking history  seen for initial pulmonary consult October 2021 for post-COVID shortness of breath felt to have underlying asthma COVID-19 infection August 2021 with COVID-pneumonia requiring hospitalization treated with remdesivir, Decadron and Baricitinib.  Medical history significant for obesity status post bariatric surgery, prediabetes,. OSA -CPAP intolerant   TEST/EVENTS :  High-resolution CT chest December 25, 2019 showed minimal subpleural groundglass in the lower lobes, tiny pulmonary nodules maximum 3 mm.  PFTs January 31, 2020-FEV1 83%, ratio 86, FVC 79%, no significant bronchodilator response, DLCO 83%  08/05/2021 Follow up ; Asthma  Patient presents for a follow-up visit.  Last seen in the office December 2021.  Patient had some lingering post-COVID shortness of breath.  Work-up revealed probable asthma.  High-resolution CT chest December 25, 2019.  It showed minimal subpleural groundglass in the lower lobes.  Tiny scattered pulmonary nodules measuring 3 mm.  PFT showed very minimal restriction with an FEV1 83%, ratio 86 and FVC at 79%.  DLCO was normal at 83%.  Patient was started on Wixela twice daily.  Patient says that this is helped her breathing quite a bit.  She has decreased shortness of breath.  No flare of cough or wheezing. Has a lot of post nasal drainage. Uses benadryl on occasion.  Says whenever she get URI starts to have cough and wheezing .  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  FENO today in office is 11 ppb .     Allergies  Allergen Reactions   Doxycycline Nausea And Vomiting    Dizziness    Other Itching and Other (See Comments)    Seasonal allergies- Itchy eyes, runny nose, etc..    Immunization History  Administered Date(s)  Administered   PFIZER(Purple Top)SARS-COV-2 Vaccination 01/28/2020, 02/18/2020   Tdap 03/16/2019    Past Medical History:  Diagnosis Date   Abnormal chest x-ray 03/24/2017   Acute hypoxemic respiratory failure ([place]) 10/19/2019   Carpal tunnel syndrome    right   Carpal tunnel syndrome of left wrist 07/2013   COVID-19 10/16/2019   Epistaxis 05/06/2020   Family history of breast cancer    PALB2 Mutation in Mother   Family history of prostate cancer    GERD (gastroesophageal reflux disease)    Headache(784.0)    migraines or sinus   History of vertigo    states comes and goes; no current med.   Morbid obesity with BMI of 45.0-49.9, adult ([place]) 02/07/2014   Obesity    Osteoarthritis    Pneumonia due to COVID-19 virus 10/16/2019   Formatting of this note might be different from the original. Last Assessment & Plan:  Formatting of this note might be different from the original. Patient seen via telehealth visit for COVID-19 infection. Comorbid obesity and pre-DM. Reports symptoms for the past week, and positive test 3 days ago. Complains of persistent fever with Tmax of 100.7, chills, mild SOB, body aches which are improving   PONV (postoperative nausea and vomiting)    Rash 08/10/2013   right thigh   Rash of neck 05/06/2020   Sleep apnea    does not wear cpap   Sore in mouth 05/06/2020   Sore throat 05/22/2019    Tobacco History: Social History   Tobacco Use  Smoking Status Former  Years: 0.00   Types: Cigarettes   Quit date: 02/22/2009   Years since quitting: 12.4  Smokeless Tobacco Never   Counseling given: Not Answered   Outpatient Medications Prior to Visit  Medication Sig Dispense Refill   acetaminophen (TYLENOL 8 HOUR) 650 MG CR tablet Take 1 tablet (650 mg total) by mouth every 8 (eight) hours as needed for pain. 30 tablet 0   diclofenac Sodium (VOLTAREN) 1 % GEL Apply 2 g topically 4 (four) times daily. 100 g 3   diphenhydrAMINE (BENADRYL) 25 MG tablet Take 25 mg by  mouth at bedtime as needed for allergies or sleep.     escitalopram (LEXAPRO) 10 MG tablet TAKE 1 TABLET BY MOUTH EVERY DAY (Patient taking differently: Take 10 mg by mouth daily.) 90 tablet 0   etonogestrel (NEXPLANON) 68 MG IMPL implant 1 each by Subdermal route once.     rosuvastatin (CRESTOR) 10 MG tablet Take 1 tablet (10 mg total) by mouth daily. 90 tablet 1   fluticasone (FLONASE) 50 MCG/ACT nasal spray SPRAY 1 SPRAY INTO BOTH NOSTRILS DAILY. (Patient not taking: Reported on 07/23/2021) 16 mL 2   LORazepam (ATIVAN) 1 MG tablet Take 1 tablet (1 mg total) by mouth as needed for anxiety (before procedure). (Patient not taking: Reported on 08/05/2021) 12 tablet 0   omeprazole (PRILOSEC) 20 MG capsule Take 1 capsule (20 mg total) by mouth daily. (Patient taking differently: Take 20 mg by mouth daily as needed (acid reflux).) 90 capsule 1   phentermine (ADIPEX-P) 37.5 MG tablet Take 18.75 mg by mouth every morning. (Patient not taking: Reported on 08/05/2021)     WIXELA INHUB 250-50 MCG/ACT AEPB INHALE 1 PUFF INTO THE LUNGS TWICE A DAY (Patient not taking: Reported on 08/05/2021) 60 each 5   No facility-administered medications prior to visit.     Review of Systems:   Constitutional:   No  weight loss, night sweats,  Fevers, chills, fatigue, or  lassitude.  HEENT:   No headaches,  Difficulty swallowing,  Tooth/dental problems, or  Sore throat,                No sneezing, itching, ear ache, nasal congestion, post nasal drip,   CV:  No chest pain,  Orthopnea, PND, swelling in lower extremities, anasarca, dizziness, palpitations, syncope.   GI  No heartburn, indigestion, abdominal pain, nausea, vomiting, diarrhea, change in bowel habits, loss of appetite, bloody stools.   Resp: No shortness of breath with exertion or at rest.  No excess mucus, no productive cough,  No non-productive cough,  No coughing up of blood.  No change in color of mucus.  No wheezing.  No chest wall deformity  Skin: no  rash or lesions.  GU: no dysuria, change in color of urine, no urgency or frequency.  No flank pain, no hematuria   MS:  No joint pain or swelling.  No decreased range of motion.  No back pain.    Physical Exam  BP 108/80 (BP Location: Right Arm, Patient Position: Sitting, Cuff Size: Normal)   Pulse 72   Temp 98.2 F (36.8 C) (Oral)   Ht '5\' 4"'  (1.626 m)   Wt 296 lb (134.3 kg)   SpO2 100%   BMI 50.81 kg/m   GEN: A/Ox3; pleasant , NAD, well nourished    HEENT:  [place]/AT,  EACs-clear, TMs-wnl, NOSE-clear, THROAT-clear, no lesions, no postnasal drip or exudate noted.   NECK:  Supple w/ fair ROM; no JVD; normal carotid impulses  w/o bruits; no thyromegaly or nodules palpated; no lymphadenopathy.    RESP  Clear  P & A; w/o, wheezes/ rales/ or rhonchi. no accessory muscle use, no dullness to percussion  CARD:  RRR, no m/r/g, no peripheral edema, pulses intact, no cyanosis or clubbing.  GI:   Soft & nt; nml bowel sounds; no organomegaly or masses detected.   Musco: Warm bil, no deformities or joint swelling noted.   Neuro: alert, no focal deficits noted.    Skin: Warm, no lesions or rashes    Lab Results:  CBC    Component Value Date/Time   WBC 10.3 07/23/2021 0924   RBC 4.77 07/23/2021 0924   HGB 14.1 07/23/2021 0924   HGB 13.0 02/28/2016 1117   HCT 44.5 07/23/2021 0924   HCT 39.3 02/28/2016 1117   PLT 280 07/23/2021 0924   PLT 296 02/28/2016 1117   MCV 93.3 07/23/2021 0924   MCV 88 02/28/2016 1117   MCH 29.6 07/23/2021 0924   MCHC 31.7 07/23/2021 0924   RDW 14.1 07/23/2021 0924   RDW 15.8 (H) 02/28/2016 1117   LYMPHSABS 2.3 07/23/2021 0924   MONOABS 0.5 07/23/2021 0924   EOSABS 0.1 07/23/2021 0924   BASOSABS 0.1 07/23/2021 0924    BMET    Component Value Date/Time   NA 141 07/23/2021 0924   NA 143 02/28/2016 1117   K 4.5 07/23/2021 0924   CL 108 07/23/2021 0924   CO2 24 07/23/2021 0924   GLUCOSE 52 (L) 07/23/2021 0924   BUN 11 07/23/2021 0924   BUN 9  02/28/2016 1117   CREATININE 0.85 07/23/2021 0924   CALCIUM 9.4 07/23/2021 0924   GFRNONAA >60 07/23/2021 0924   GFRAA >60 10/19/2019 1455    BNP No results found for: "BNP"  ProBNP No results found for: "PROBNP"  Imaging: No results found.       Latest Ref Rng & Units 01/31/2020   12:04 PM  PFT Results  FVC-Pre L 2.43   FVC-Predicted Pre % 79   FVC-Post L 2.29   FVC-Predicted Post % 75   Pre FEV1/FVC % % 86   Post FEV1/FCV % % 87   FEV1-Pre L 2.08   FEV1-Predicted Pre % 83   FEV1-Post L 1.99   DLCO uncorrected ml/min/mmHg 18.00   DLCO UNC% % 83   DLCO corrected ml/min/mmHg 18.00   DLCO COR %Predicted % 83   DLVA Predicted % 125   TLC L 3.95   TLC % Predicted % 78   RV % Predicted % 99     No results found for: "NITRICOXIDE"      Assessment & Plan:   Asthma Mild persistent asthma versus reactive airways.  Patient seems to have better symptom control on maintenance ICS/LABA.  We will continue current regimen. Would control for triggers.  Add an albuterol inhaler for rescue.  Asthma action plan discussed  Plan  Patient Instructions  Continue on Wixela inhaler 1 puff twice daily, rinse after use Albuterol inhaler as needed for wheezing May use Zyrtec 10 mg daily as needed for drainage Flonase daily as needed  Chest xray today.  Follow up with Dr. Vaughan Browner in 1 year and As needed         Allergic rhinitis With change to daily Zyrtec as needed.  Trigger prevention May use Flonase as needed  Plan  Patient Instructions  Continue on Wixela inhaler 1 puff twice daily, rinse after use Albuterol inhaler as needed for wheezing May use Zyrtec  10 mg daily as needed for drainage Flonase daily as needed  Chest xray today.  Follow up with Dr. Vaughan Browner in 1 year and As needed    '     Afra Tricarico, NP 08/05/2021

## 2021-08-05 NOTE — Assessment & Plan Note (Signed)
With change to daily Zyrtec as needed.  Trigger prevention May use Flonase as needed  Plan  Patient Instructions  Continue on Wixela inhaler 1 puff twice daily, rinse after use Albuterol inhaler as needed for wheezing May use Zyrtec 10 mg daily as needed for drainage Flonase daily as needed  Chest xray today.  Follow up with Dr. Vaughan Browner in 1 year and As needed    '

## 2021-08-05 NOTE — Assessment & Plan Note (Signed)
Mild persistent asthma versus reactive airways.  Patient seems to have better symptom control on maintenance ICS/LABA.  We will continue current regimen. Would control for triggers.  Add an albuterol inhaler for rescue.  Asthma action plan discussed  Plan  Patient Instructions  Continue on Wixela inhaler 1 puff twice daily, rinse after use Albuterol inhaler as needed for wheezing May use Zyrtec 10 mg daily as needed for drainage Flonase daily as needed  Chest xray today.  Follow up with Dr. Isaiah Serge in 1 year and As needed

## 2021-08-13 ENCOUNTER — Ambulatory Visit (INDEPENDENT_AMBULATORY_CARE_PROVIDER_SITE_OTHER): Payer: Medicaid Other | Admitting: Physician Assistant

## 2021-08-13 ENCOUNTER — Other Ambulatory Visit: Payer: Self-pay | Admitting: Physician Assistant

## 2021-08-13 ENCOUNTER — Encounter (HOSPITAL_COMMUNITY): Payer: Self-pay | Admitting: Physician Assistant

## 2021-08-13 VITALS — BP 138/85 | HR 72 | Ht 64.0 in | Wt 298.0 lb

## 2021-08-13 DIAGNOSIS — F411 Generalized anxiety disorder: Secondary | ICD-10-CM

## 2021-08-13 DIAGNOSIS — F332 Major depressive disorder, recurrent severe without psychotic features: Secondary | ICD-10-CM

## 2021-08-13 MED ORDER — ESCITALOPRAM OXALATE 10 MG PO TABS: 10.0000 mg | ORAL_TABLET | Freq: Every day | ORAL | 0 refills | Status: DC

## 2021-08-13 MED ORDER — ESCITALOPRAM OXALATE 10 MG PO TABS: 10.0000 mg | ORAL_TABLET | Freq: Every day | ORAL | 2 refills | Status: DC
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
## 2021-08-19 ENCOUNTER — Other Ambulatory Visit: Payer: Self-pay | Admitting: Hematology and Oncology

## 2021-08-19 DIAGNOSIS — Z1231 Encounter for screening mammogram for malignant neoplasm of breast: Secondary | ICD-10-CM

## 2021-08-21 ENCOUNTER — Telehealth (HOSPITAL_COMMUNITY): Payer: Self-pay | Admitting: Physician Assistant

## 2021-08-21 NOTE — Telephone Encounter (Signed)
Patient called in about transportation issues, info given to ava.

## 2021-09-02 DIAGNOSIS — Z1231 Encounter for screening mammogram for malignant neoplasm of breast: Secondary | ICD-10-CM

## 2021-09-11 DIAGNOSIS — Z760 Encounter for issue of repeat prescription: Secondary | ICD-10-CM | POA: Diagnosis not present

## 2021-09-11 DIAGNOSIS — E7849 Other hyperlipidemia: Secondary | ICD-10-CM | POA: Diagnosis not present

## 2021-09-11 DIAGNOSIS — Z6841 Body Mass Index (BMI) 40.0 and over, adult: Secondary | ICD-10-CM | POA: Diagnosis not present

## 2021-09-22 ENCOUNTER — Ambulatory Visit
Admission: RE | Admit: 2021-09-22 | Discharge: 2021-09-22 | Disposition: A | Discharge status: Home / Self Care | Payer: Medicaid Other | Source: Ambulatory Visit

## 2021-09-22 DIAGNOSIS — Z1231 Encounter for screening mammogram for malignant neoplasm of breast: Secondary | ICD-10-CM

## 2021-09-24 ENCOUNTER — Encounter: Payer: Self-pay | Admitting: Hematology and Oncology

## 2021-09-30 ENCOUNTER — Inpatient Hospital Stay: Payer: Medicaid Other | Attending: Hematology and Oncology | Admitting: Hematology and Oncology

## 2021-09-30 ENCOUNTER — Ambulatory Visit: Payer: Medicaid Other | Admitting: Hematology and Oncology

## 2021-09-30 ENCOUNTER — Other Ambulatory Visit: Payer: Self-pay

## 2021-09-30 ENCOUNTER — Encounter: Payer: Self-pay | Admitting: Hematology and Oncology

## 2021-09-30 VITALS — BP 158/98 | HR 99 | Temp 97.4°F | Resp 18 | Ht 64.0 in | Wt 294.6 lb

## 2021-09-30 DIAGNOSIS — Z1509 Genetic susceptibility to other malignant neoplasm: Secondary | ICD-10-CM | POA: Diagnosis not present

## 2021-09-30 DIAGNOSIS — Z1589 Genetic susceptibility to other disease: Secondary | ICD-10-CM

## 2021-09-30 DIAGNOSIS — Z1501 Genetic susceptibility to malignant neoplasm of breast: Secondary | ICD-10-CM

## 2021-09-30 DIAGNOSIS — Z803 Family history of malignant neoplasm of breast: Secondary | ICD-10-CM | POA: Diagnosis not present

## 2021-09-30 NOTE — Progress Notes (Signed)
[place]  [phone] [phone]    ID: Dawn Galvan DOB: [birth year]  MR#: [id number]  STM#:[id number]  Patient Care Team: Allwardt, Randa Evens, PA-C as PCP - General (Physician Assistant) Magrinat, Virgie Dad, MD (Inactive) as Consulting Physician (Oncology)   CHIEF COMPLAINT: PALB 2 Mutation, morbid obesity  CURRENT TREATMENT: intensified screening while definitive surgery pending  INTERVAL HISTORY:  Haili returns today for follow up of her PALB2 mutation Since her last visit, she underwent mammogram in July which was without any abnormalities. She is still working on losing weight but she has been fluctuating from weight perspective. She is here to follow-up with gynecologist for ovarian cancer screening.  She denies any palpable masses but she does not check often enough. Rest of the pertinent 10 point ROS reviewed and negative    COVID 19 VACCINATION STATUS: infection 09/2019; [place] x2 in 01/2020   HISTORY OF CURRENT ILLNESS: From the original intake note:  Dawn Galvan is a 46 y.o. 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 She was originally under the care of Dr. Lindi Adie, but the patient requested a second opinion. Melodie's mother, [name], was also diagnosed with breast cancer related to PALB 2 mutation.  [name] opted for intensified screening but passed on antiestrogens. Because of this history, Cyd also underwent genetic testing and it returned positive for the PALB 2 mutation in July 2019.  She also decided against antiestrogens.   PAST MEDICAL HISTORY: Past Medical History:  Diagnosis Date   Abnormal chest x-ray 03/24/2017   Acute hypoxemic respiratory failure ([place]) 10/19/2019   Carpal tunnel syndrome    right   Carpal tunnel syndrome of left wrist 07/2013   COVID-19 10/16/2019   Epistaxis 05/06/2020   Family history of breast cancer    PALB2 Mutation in Mother   Family history of prostate  cancer    GERD (gastroesophageal reflux disease)    Headache(784.0)    migraines or sinus   History of vertigo    states comes and goes; no current med.   Morbid obesity with BMI of 45.0-49.9, adult ([place]) 02/07/2014   Obesity    Osteoarthritis    Pneumonia due to COVID-19 virus 10/16/2019   Formatting of this note might be different from the original. Last Assessment & Plan:  Formatting of this note might be different from the original. Patient seen via telehealth visit for COVID-19 infection. Comorbid obesity and pre-DM. Reports symptoms for the past week, and positive test 3 days ago. Complains of persistent fever with Tmax of 100.7, chills, mild SOB, body aches which are improving   PONV (postoperative nausea and vomiting)    Rash 08/10/2013   right thigh   Rash of neck 05/06/2020   Sleep apnea    does not wear cpap   Sore in mouth 05/06/2020   Sore throat 05/22/2019    PAST SURGICAL HISTORY: Past Surgical History:  Procedure Laterality Date   CARPAL TUNNEL RELEASE Left 08/18/2013   Procedure: LEFT CARPAL TUNNEL RELEASE;  Surgeon: Tennis Must, MD;  Location: [facility];  Service: Orthopedics;  Laterality: Left;   CARPAL TUNNEL RELEASE Right 04/29/2017   Procedure: RIGHT CARPAL TUNNEL RELEASE;  Surgeon: Leanora Cover, MD;  Location: [facility];  Service: Orthopedics;  Laterality: Right;   CESAREAN SECTION     CHOLECYSTECTOMY     HERNIA REPAIR     x 2   HYSTEROPLASTY REPAIR OF UTERINE ANOMALY  HYSTEROSCOPY WITH D & C  01/13/2012   Procedure: DILATATION AND CURETTAGE /HYSTEROSCOPY;  Surgeon: Osborne Oman, MD;  Location: [facility];  Service: Gynecology;  Laterality: N/A;  Pap smear   LAPAROSCOPIC GASTRIC SLEEVE RESECTION  2016   TOE SURGERY Left    second toe. rod placed   WISDOM TOOTH EXTRACTION  11/20/10    FAMILY HISTORY: Family History  Problem Relation Age of Onset   Hypertension Mother    Breast cancer Mother    Hypertension Father    Diabetes Maternal Grandmother    Diabetes  Paternal Grandmother   As of August 2020 Zanovia's father is in his 42's. Patients' mother is 22: She also carries a PALB2 mutation and has been diagnosed with breast cancer. The patient has 1 brother and 2 sisters, none with cancer. Two of her maternal aunts (3 out of her mother's 8 sisters) had breast cancer. Patient denies anyone in her family having ovarian or pancreatic cancer.    GYNECOLOGIC HISTORY:  No LMP recorded. Patient has had an implant. Menarche: 47 years old Age at first live birth: 46 years old Dawn Galvan: 2 LMP: unknown, uses implant birth control Contraceptive: implanon Hysterectomy?: no BSO?: no   SOCIAL HISTORY:  Paradise is a single. 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 She has two children: [name] and [name]. [name] is a Museum/gallery exhibitions officer at [employer] [name] is in 11th grade.  The patient does not belong to a church, synagogue, or mosque.    ADVANCED DIRECTIVES: Not in place    HEALTH MAINTENANCE: Social History   Tobacco Use   Smoking status: Former    Years: 0.00    Types: Cigarettes    Quit date: 02/22/2009    Years since quitting: 12.6   Smokeless tobacco: Never  Vaping Use   Vaping Use: Never used  Substance Use Topics   Alcohol use: Yes    Comment: Social   Drug use: No    Colonoscopy: Dr. Lyndel Safe in [place]  [facility]  Bone density: none [facility]  Allergies  Allergen Reactions   Doxycycline Nausea And Vomiting    Dizziness    Other Itching and Other (See Comments)    Seasonal allergies- Itchy eyes, runny nose, etc..    Current Outpatient Medications  Medication Sig Dispense Refill   acetaminophen (TYLENOL 8 HOUR) 650 MG CR tablet Take 1 tablet (650 mg total) by mouth every 8 (eight) hours as needed for pain. 30 tablet 0   albuterol (VENTOLIN HFA) 108 (90 Base) MCG/ACT inhaler Inhale 1-2 puffs into the lungs every 6 (six) hours as needed. 8 g 2   diclofenac Sodium (VOLTAREN) 1 %  GEL Apply 2 g topically 4 (four) times daily. 100 g 3   diphenhydrAMINE (BENADRYL) 25 MG tablet Take 25 mg by mouth at bedtime as needed for allergies or sleep.     escitalopram (LEXAPRO) 10 MG tablet Take 1 tablet (10 mg total) by mouth daily. 30 tablet 2   etonogestrel (NEXPLANON) 68 MG IMPL implant 1 each by Subdermal route once.     fluticasone (FLONASE) 50 MCG/ACT nasal spray SPRAY 1 SPRAY INTO BOTH NOSTRILS DAILY. 16 mL 11   fluticasone-salmeterol (WIXELA INHUB) 250-50 MCG/ACT AEPB INHALE 1 PUFF INTO THE LUNGS TWICE A DAY 60 each 11   LORazepam (ATIVAN) 1 MG tablet Take 1 tablet (1 mg total) by mouth as needed for anxiety (before procedure). (Patient not taking: Reported  on 08/05/2021) 12 tablet 0   omeprazole (PRILOSEC) 20 MG capsule Take 1 capsule (20 mg total) by mouth daily. (Patient taking differently: Take 20 mg by mouth daily as needed (acid reflux).) 90 capsule 1   phentermine (ADIPEX-Galvan) 37.5 MG tablet Take 18.75 mg by mouth every morning. (Patient not taking: Reported on 08/05/2021)     rosuvastatin (CRESTOR) 10 MG tablet Take 1 tablet (10 mg total) by mouth daily. 90 tablet 1   No current facility-administered medications for this visit.    OBJECTIVE: African American woman in no acute distress  There were no vitals filed for this visit.    There is no height or weight on file to calculate BMI.   Wt Readings from Last 3 Encounters:  08/05/21 296 lb (134.3 kg)  06/19/21 296 lb 8.3 oz (134.5 kg)  03/11/21 296 lb 9.6 oz (134.5 kg)     ECOG FS:1 - Symptomatic but completely ambulatory  Sclerae unicteric, EOMs intact Large pendulous breast.  No palpable masses or regional adenopathy. Bilateral lower extremities without any edema.   Abdomen soft, nontender, nondistended. Chest: Clear to auscultation bilaterally   LAB RESULTS:  CMP     Component Value Date/Time   NA 141 07/23/2021 0924   NA 143 02/28/2016 1117   K 4.5 07/23/2021 0924   CL 108 07/23/2021 0924   CO2 24  07/23/2021 0924   GLUCOSE 52 (L) 07/23/2021 0924   BUN 11 07/23/2021 0924   BUN 9 02/28/2016 1117   CREATININE 0.85 07/23/2021 0924   CALCIUM 9.4 07/23/2021 0924   PROT 8.2 (H) 07/23/2021 0924   ALBUMIN 4.0 07/23/2021 0924   AST 21 07/23/2021 0924   ALT 12 07/23/2021 0924   ALKPHOS 72 07/23/2021 0924   BILITOT 1.0 07/23/2021 0924   GFRNONAA >60 07/23/2021 0924   GFRAA >60 10/19/2019 1455   Lab Results  Component Value Date   WBC 10.3 07/23/2021   NEUTROABS 7.3 07/23/2021   HGB 14.1 07/23/2021   HCT 44.5 07/23/2021   MCV 93.3 07/23/2021   PLT 280 07/23/2021   No results found for: "LABCA2"  No components found for: "RWERXV400"  No results for input(s): "INR" in the last 168 hours.  No results found for: "LABCA2"  No results found for: "QQP619"  No results found for: "CAN125"  No results found for: "CAN153"  No results found for: "CA2729"  No components found for: "HGQUANT"  No results found for: "CEA1", "CEA" / No results found for: "CEA1", "CEA"   No results found for: "AFPTUMOR"  No results found for: "CHROMOGRNA"  No results found for: "TOTALPROTELP", "ALBUMINELP", "A1GS", "A2GS", "BETS", "BETA2SER", "GAMS", "MSPIKE", "SPEI" (this displays SPEP labs)  No results found for: "KPAFRELGTCHN", "LAMBDASER", "KAPLAMBRATIO" (kappa/lambda light chains)  No results found for: "HGBA", "HGBA2QUANT", "HGBFQUANT", "HGBSQUAN" (Hemoglobinopathy evaluation)   Lab Results  Component Value Date   LDH 381 (H) 10/19/2019    No results found for: "IRON", "TIBC", "IRONPCTSAT" (Iron and TIBC)  Lab Results  Component Value Date   FERRITIN 1,190 (H) 10/19/2019    Urinalysis    Component Value Date/Time   COLORURINE YELLOW 11/14/2017 [place] 12/27/2019 1520   LABSPEC >=1.030 10/29/2019 1714   PHURINE 5.5 10/29/2019 1714   GLUCOSEU Negative 12/27/2019 1520   HGBUR NEGATIVE 10/29/2019 1714   BILIRUBINUR Negative 12/27/2019 1625   BILIRUBINUR  Negative 12/27/2019 1520   KETONESUR TRACE (A) 10/29/2019 1714   PROTEINUR Negative 12/27/2019 1625   PROTEINUR Negative 12/27/2019  [place] 10/29/2019 1714   UROBILINOGEN 2.0 (A) 12/27/2019 1625   UROBILINOGEN 1.0 10/29/2019 1714   NITRITE Negative 12/27/2019 1625   NITRITE Negative 12/27/2019 1520   NITRITE NEGATIVE 10/29/2019 1714   LEUKOCYTESUR Negative 12/27/2019 1625   LEUKOCYTESUR Negative 12/27/2019 1520   LEUKOCYTESUR NEGATIVE 10/29/2019 1714    STUDIES:  MM 3D SCREEN BREAST BILATERAL  Result Date: 09/24/2021 CLINICAL DATA:  Screening. EXAM: DIGITAL SCREENING BILATERAL MAMMOGRAM WITH TOMOSYNTHESIS AND CAD TECHNIQUE: Bilateral screening digital craniocaudal and mediolateral oblique mammograms were obtained. Bilateral screening digital breast tomosynthesis was performed. The images were evaluated with computer-aided detection. COMPARISON:  Previous exam(s). ACR Breast Density Category b: There are scattered areas of fibroglandular density. FINDINGS: There are no findings suspicious for malignancy. IMPRESSION: No mammographic evidence of malignancy. A result letter of this screening mammogram will be mailed directly to the patient. RECOMMENDATION: Screening mammogram in one year. (Code:SM-B-01Y) BI-RADS CATEGORY  1: Negative. Electronically Signed   By: Nolon Nations M.D.   On: 09/24/2021 12:57     ELIGIBLE FOR AVAILABLE RESEARCH PROTOCOL: no   ASSESSMENT: 46 y.o. 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   (2) patient plans to undergo bilateral prophylactic mastectomies with reconstruction, delayed until BMI decreases  (3) intensified screening: Mammography in June, breast MRI November or December   PLAN:  Ms. Twylla is here for intensified screening.  Since last visit, she  denies any new health issues.  She is still working on losing weight.  She follows up with the weight loss program at [facility].  Her most recent mammogram is without any concerns.  She will be doing an MRI in February.  This has been ordered.  No concerns on physical examination today.  We have also discussed about risk reducing bilateral salpingo-oophorectomy given her PAL B2 mutation.  She was encouraged to talk to her gynecologist about this. She is eventually hoping to get bilateral mastectomy.  In the interim she continues on intensified screening. Total time spent: 30 minutes *Total Encounter Time as defined by the Centers for Medicare and Medicaid Services includes, in addition to the face-to-face time of a patient visit (documented in the note above) non-face-to-face time: obtaining and reviewing outside history, ordering and reviewing medications, tests or procedures, care coordination (communications with other health care professionals or caregivers) and documentation in the medical record.

## 2021-10-02 ENCOUNTER — Telehealth: Payer: Self-pay | Admitting: Family Medicine

## 2021-10-02 NOTE — Telephone Encounter (Signed)
Patient want to discuss when she need to come for a pap and have her Nexplanon removed.   Patient have a lot of health questions, I told her a nurse will call her and go over everything.

## 2021-10-03 ENCOUNTER — Encounter: Payer: Self-pay | Admitting: *Deleted

## 2021-10-03 NOTE — Telephone Encounter (Signed)
I called Dawn Galvan to discuss her questions but reached a voicemail, I left message I was returning her call and will send a detailed MyChart message for her to review. [name]

## 2021-10-11 ENCOUNTER — Other Ambulatory Visit: Payer: Self-pay | Admitting: Physician Assistant

## 2021-10-11 DIAGNOSIS — F332 Major depressive disorder, recurrent severe without psychotic features: Secondary | ICD-10-CM

## 2021-10-11 DIAGNOSIS — F411 Generalized anxiety disorder: Secondary | ICD-10-CM

## 2021-10-15 ENCOUNTER — Encounter (HOSPITAL_COMMUNITY): Payer: Self-pay

## 2021-10-15 ENCOUNTER — Encounter (HOSPITAL_COMMUNITY): Payer: Medicaid Other | Admitting: Physician Assistant

## 2021-10-21 ENCOUNTER — Ambulatory Visit (HOSPITAL_COMMUNITY): Payer: Medicaid Other | Admitting: Clinical

## 2021-10-23 ENCOUNTER — Other Ambulatory Visit: Payer: Self-pay | Admitting: Pulmonary Disease

## 2021-11-17 ENCOUNTER — Encounter: Payer: Self-pay | Admitting: *Deleted

## 2021-12-01 ENCOUNTER — Ambulatory Visit (INDEPENDENT_AMBULATORY_CARE_PROVIDER_SITE_OTHER): Payer: Medicaid Other | Admitting: Clinical

## 2021-12-01 DIAGNOSIS — F332 Major depressive disorder, recurrent severe without psychotic features: Secondary | ICD-10-CM

## 2021-12-10 ENCOUNTER — Encounter: Payer: Self-pay | Admitting: Physician Assistant

## 2021-12-10 ENCOUNTER — Other Ambulatory Visit: Payer: Self-pay | Admitting: Physician Assistant

## 2021-12-10 DIAGNOSIS — F332 Major depressive disorder, recurrent severe without psychotic features: Secondary | ICD-10-CM

## 2021-12-10 DIAGNOSIS — F411 Generalized anxiety disorder: Secondary | ICD-10-CM

## 2021-12-11 ENCOUNTER — Other Ambulatory Visit (HOSPITAL_COMMUNITY): Payer: Self-pay

## 2021-12-11 ENCOUNTER — Other Ambulatory Visit: Payer: Self-pay

## 2021-12-11 ENCOUNTER — Encounter: Payer: Self-pay | Admitting: Physician Assistant

## 2021-12-11 DIAGNOSIS — M25512 Pain in left shoulder: Secondary | ICD-10-CM

## 2021-12-11 MED ORDER — DICLOFENAC SODIUM 1 % EX GEL
2.0000 g | Freq: Four times a day (QID) | CUTANEOUS | 3 refills | Status: DC
  Filled 2021-12-11: qty 100, 13d supply, fill #0

## 2021-12-11 MED ORDER — OMEPRAZOLE 20 MG PO CPDR
20.0000 mg | DELAYED_RELEASE_CAPSULE | Freq: Every day | ORAL | 1 refills | Status: DC
  Filled 2021-12-11: qty 90, 90d supply, fill #0

## 2021-12-11 MED ORDER — ESCITALOPRAM OXALATE 10 MG PO TABS
10.0000 mg | ORAL_TABLET | Freq: Every day | ORAL | 0 refills | Status: DC
  Filled 2021-12-11 – 2021-12-12 (×2): qty 90, 90d supply, fill #0

## 2021-12-12 ENCOUNTER — Other Ambulatory Visit (HOSPITAL_COMMUNITY): Payer: Self-pay

## 2021-12-12 NOTE — Telephone Encounter (Signed)
Please see notes and let me know if you agree pt should make an appointment to check in sooner than later, thanks

## 2021-12-19 ENCOUNTER — Other Ambulatory Visit: Payer: Self-pay | Admitting: *Deleted

## 2021-12-19 ENCOUNTER — Other Ambulatory Visit (HOSPITAL_COMMUNITY): Payer: Self-pay

## 2021-12-19 DIAGNOSIS — F332 Major depressive disorder, recurrent severe without psychotic features: Secondary | ICD-10-CM

## 2021-12-19 DIAGNOSIS — F411 Generalized anxiety disorder: Secondary | ICD-10-CM

## 2021-12-19 DIAGNOSIS — M25512 Pain in left shoulder: Secondary | ICD-10-CM

## 2021-12-19 MED ORDER — OMEPRAZOLE 20 MG PO CPDR: 20.0000 mg | DELAYED_RELEASE_CAPSULE | Freq: Every day | ORAL | 1 refills | Status: DC

## 2021-12-19 MED ORDER — ROSUVASTATIN CALCIUM 10 MG PO TABS: 10.0000 mg | ORAL_TABLET | Freq: Every day | ORAL | 1 refills | Status: DC

## 2021-12-19 MED ORDER — ESCITALOPRAM OXALATE 10 MG PO TABS: 10.0000 mg | ORAL_TABLET | Freq: Every day | ORAL | 1 refills | Status: DC

## 2021-12-19 MED ORDER — DICLOFENAC SODIUM 1 % EX GEL: 2.0000 g | Freq: Four times a day (QID) | CUTANEOUS | 1 refills | Status: DC

## 2021-12-22 DIAGNOSIS — Z760 Encounter for issue of repeat prescription: Secondary | ICD-10-CM | POA: Diagnosis not present

## 2021-12-22 DIAGNOSIS — Z6841 Body Mass Index (BMI) 40.0 and over, adult: Secondary | ICD-10-CM | POA: Diagnosis not present

## 2021-12-22 DIAGNOSIS — Z13228 Encounter for screening for other metabolic disorders: Secondary | ICD-10-CM | POA: Diagnosis not present

## 2021-12-22 DIAGNOSIS — E668 Other obesity: Secondary | ICD-10-CM | POA: Diagnosis not present

## 2021-12-22 DIAGNOSIS — R7303 Prediabetes: Secondary | ICD-10-CM | POA: Diagnosis not present

## 2021-12-22 MED ORDER — FLUTICASONE PROPIONATE 50 MCG/ACT NA SUSP: NASAL | 11 refills | Status: DC

## 2021-12-22 MED ORDER — FLUTICASONE-SALMETEROL 250-50 MCG/ACT IN AEPB: INHALATION_SPRAY | RESPIRATORY_TRACT | 11 refills | Status: DC

## 2021-12-22 MED ORDER — ALBUTEROL SULFATE HFA 108 (90 BASE) MCG/ACT IN AERS: 1.0000 | INHALATION_SPRAY | Freq: Four times a day (QID) | RESPIRATORY_TRACT | 2 refills | Status: DC | PRN

## 2021-12-22 NOTE — Telephone Encounter (Signed)
Placed orders for Albuterol HFA, Wixela and Flonase. Notified pt via My Chart. Nothing further needed at this time.

## 2021-12-23 ENCOUNTER — Ambulatory Visit (INDEPENDENT_AMBULATORY_CARE_PROVIDER_SITE_OTHER): Payer: Medicaid Other | Admitting: Clinical

## 2021-12-23 DIAGNOSIS — F332 Major depressive disorder, recurrent severe without psychotic features: Secondary | ICD-10-CM

## 2021-12-23 NOTE — Plan of Care (Signed)
  Problem: Depression CCP Problem  1  Goal: LTG: [name] WILL SCORE LESS THAN 10 ON THE PATIENT HEALTH QUESTIONNAIRE (PHQ-9) Outcome: Progressing Goal: STG: Reduce overall depression score by a minimum of 25% on the Patient Health Questionnaire (PHQ-9) or the Montgomery-Asberg Depression Rating Scale (MADRS) Outcome: Progressing Goal: STG: [name] WILL IDENTIFY 3 COGNITIVE PATTERNS AND BELIEFS THAT SUPPORT DEPRESSION Outcome: Progressing

## 2021-12-23 NOTE — Progress Notes (Signed)
THERAPIST PROGRESS NOTE  Session Time: 45 minutes  Participation Level: Active  Behavioral Response: CasualAlertEuthymic  Type of Therapy: Individual Therapy  Treatment Goals addressed: Client will identify 3 cognitive patterns and believes that support depression  ProgressTowards Goals: Progressing  Interventions: CBT and Supportive  Summary:  Dawn Galvan is a 46 y.o. female who presents for the scheduled appointment oriented x5, appropriately dressed, and friendly.  Client denies hallucinations or delusions. Client reported since the beginning of this week she is feeling better but the previous weekend she was having issues with her son involving police.  Client reported she is thinking about filing a complaint with the police department because while she was having a dispute with her son it caused her to feel like she was in the wrong and verbally told her she was about mom which she felt was inappropriate to the situation that was transpiring.  Client reported she was crying throughout the weekend and having passive suicidal ideations.  Client reported she has noticed that in combination with her depression is also anxiety which she tends to overthink and in defense since she has had to explain herself since childhood.  Client reported her mother was not loving.  Client reported as of currently she has had to put boundaries in place with her mom and communicating with her as minimal as possible because she was a contributing factor to her hospitalization this year.  Client reported when she felt suicidal in the past it was not to end her life but wants to give herself a break until she got to a point of feeling at ease.  Client reported that for example when she overdosed on her psychiatric medications a few months ago.  Client reported she knows that she reacts in the heat of the moment and afterwards regrets what she did and is able to think of alternatives that she could have done  instead of reacting while emotional.  Client reported she describes herself as an emotional person and often cries when she feels upset to calm herself down.  Client reported she is now receiving her psychiatric medications by mail and has started taking them since this past Monday.  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 Evidence of progress towards goal: Client reported she is medication compliant 7 days out of the week.  Client reported 1 cognitive pattern as it relates to her anxiety and leads to depression regarding negative beliefs about herself. Suicidal/Homicidal: Nowithout intent/plan  Therapist Response:  Therapist began the appointment asking client how she has been doing since last seen. Therapist used CBT to engage using active listening and positive emotional support towards her thoughts and feelings. Therapist used CBT to get the client time to discuss recent stressors within her family. Therapist used CBT to normalize the clients emotional response and ask her open-ended questions about her anxiety and depressive cycle and how it interferes with her daily functioning. Therapist used CBT to discuss utilizing positive behavioral activation and performing the routine daily. Therapist used CBT ask the client to identify her progress with frequency of use with coping skills with continued practice in her daily activity.    Therapist assigned client homework to engage in medication compliant, sleep hygiene, and spending 30 minutes to an hour daily tending to her mental health.  Plan: Return again in 3 weeks.  Diagnosis: Severe  episode of recurrent major depressive disorder, without psychotic features  Collaboration of Care: Patient refused AEB no other needs requested by the client at this time.  Patient/Guardian was advised Release of Information must be obtained  prior to any record release in order to collaborate their care with an outside provider. Patient/Guardian was advised if they have not already done so to contact the registration department to sign all necessary forms in order for [place] to release information regarding their care.   Consent: Patient/Guardian gives verbal consent for treatment and assignment of benefits for services provided during this visit. Patient/Guardian expressed understanding and agreed to proceed.   [place], LCSW 12/23/2021

## 2021-12-25 ENCOUNTER — Ambulatory Visit: Payer: Medicaid Other | Admitting: Obstetrics and Gynecology
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
## 2021-12-31 ENCOUNTER — Ambulatory Visit: Payer: Medicaid Other | Admitting: Family Medicine

## 2022-01-01 ENCOUNTER — Other Ambulatory Visit: Payer: Self-pay | Admitting: *Deleted

## 2022-01-01 MED ORDER — FLUTICASONE PROPIONATE 50 MCG/ACT NA SUSP: NASAL | 3 refills | Status: DC

## 2022-01-01 MED ORDER — FLUTICASONE-SALMETEROL 250-50 MCG/ACT IN AEPB: INHALATION_SPRAY | RESPIRATORY_TRACT | 3 refills | Status: DC

## 2022-01-05 ENCOUNTER — Ambulatory Visit (HOSPITAL_COMMUNITY): Payer: Medicaid Other | Admitting: Clinical

## 2022-01-13 ENCOUNTER — Ambulatory Visit: Payer: Medicaid Other | Admitting: Pulmonary Disease

## 2022-01-14 ENCOUNTER — Ambulatory Visit: Payer: Medicaid Other | Admitting: Primary Care

## 2022-01-14 NOTE — Progress Notes (Deleted)
_0  ID: Dawn Galvan, female    DOB: [birth year], 46 y.o.   MRN: [id number]  No chief complaint on file.   Referring provider: [place]ward[place], Randa Evens, PA-C  HPI:  46 year old female, former light smoker.  Past medical history significant for COVID-19 pneumonia, acute respiratory failure with hypoxia, obesity status post bariatric surgery, prediabetes.  Patient of Dr. Vaughan Browner, consult on 11/24/2019 for post Covid dyspnea.  She had a high-resolution CT on 11/1 that showed minimal subpleural groundglass in both lower lobes.  Nonspecific interstitial pneumonitis or UIP cannot be excluded.  In addition she had tiny pleural nodules, recommend follow-up CT in 12 months if patient is high risk.  Dr. Vaughan Browner reviewed CT imaging and felt changes reflected minimal inflammation suggested of resolving Covid pneumonia.  Previous [facility] pulmonary encounter: 01/01/2020 Presents today for a 1 month follow-up post COVID 19 dyspnea.  She was hospitalized in August 2021 for covid-19 pneumonia, received remdesivir, decadron and baricitnib. She was scheduled for PFTs on 12/29/2019 with an overview with Dr. Vaughan Browner however this appointment was canceled. She can not get covid tested until the end of this month.  She notices shortness of breath more when speaking and with ADLs such as shopping. She is constantly needing to clear her throat to get mucus up. Her weight fluctuates 5 lbs. She follows with bariatric surgeon, looking to have skin removal.   02/07/2020  Patient presents today for 46-6 week follow-up with PFTs. She was hospitalized with covid-19 pneumonia in August 2021. HRCT showed minimal subpleural ground glass in both lower lobes, felt to reflect inflammation suggestive of resolving covid-19 pneumonia. D/t persistent dyspnea we started her on trial low dose ICS/LABA during last visit. Currently on Dulera 100 d/t insurance coverage, feels Memory Dance worked better and lasted longer. She needs to try two alternatives  before PA could be processed for [facility]. She continues to have some shortness of breath, nasal congestion and intermittent chest discomfort. She is unsure if it is due to her breathing or when she is out in the cold. Reports that she notices herself holding her breath at times. She had an incentive spirometer but it broke.   01/14/2022- Interim hx  Patient presents today for OV follow-up. Last seen in June 2023.           Pulmonary function testing: FVC 2.29 (75%), FEV1 1.99 (80%), ratio 87, DLCOunc 18 (83%) p using Dulera  Mild restriction, no BD response. Normal diffusion capacity, however, not correlated for hgb  Imaging: 12/25/19 HRCT- Minimal subpleural ground-glass in the posterolateral aspects of both lower lobes is nonspecific and can be seen with atelectasis. Nonspecific interstitial pneumonitis or usual interstitial pneumonitis cannot be excluded. Tiny pulmonary nodules. No follow-up needed if patient is low-risk (and has no known or suspected primary neoplasm). Non-contrast chest CT can be considered in 12 months if patient is high-risk.             Allergies  Allergen Reactions   Doxycycline Nausea And Vomiting    Dizziness    Other Itching and Other (See Comments)    Seasonal allergies- Itchy eyes, runny nose, etc..    Immunization History  Administered Date(s) Administered   PFIZER(Purple Top)SARS-COV-2 Vaccination 01/28/2020, 02/18/2020   Tdap 03/16/2019    Past Medical History:  Diagnosis Date   Abnormal chest x-ray 03/24/2017   Acute hypoxemic respiratory failure ([place]) 10/19/2019   Carpal tunnel syndrome    right   Carpal tunnel syndrome of left wrist 07/2013  COVID-19 10/16/2019   Epistaxis 05/06/2020   Family history of breast cancer    PALB2 Mutation in Mother   Family history of prostate cancer    GERD (gastroesophageal reflux disease)    Headache(784.0)    migraines or sinus   History of vertigo    states comes and goes; no current med.    Morbid obesity with BMI of 45.0-49.9, adult ([place]) 02/07/2014   Obesity    Osteoarthritis    Pneumonia due to COVID-19 virus 10/16/2019   Formatting of this note might be different from the original. Last Assessment & Plan:  Formatting of this note might be different from the original. Patient seen via telehealth visit for COVID-19 infection. Comorbid obesity and pre-DM. Reports symptoms for the past week, and positive test 3 days ago. Complains of persistent fever with Tmax of 100.7, chills, mild SOB, body aches which are improving   PONV (postoperative nausea and vomiting)    Rash 08/10/2013   right thigh   Rash of neck 05/06/2020   Sleep apnea    does not wear cpap   Sore in mouth 05/06/2020   Sore throat 05/22/2019    Tobacco History: Social History   Tobacco Use  Smoking Status Former   Years: 0.00   Types: Cigarettes   Quit date: 02/22/2009   Years since quitting: 12.9  Smokeless Tobacco Never   Counseling given: Not Answered   Outpatient Medications Prior to Visit  Medication Sig Dispense Refill   acetaminophen (TYLENOL 8 HOUR) 650 MG CR tablet Take 1 tablet (650 mg total) by mouth every 8 (eight) hours as needed for pain. 30 tablet 0   albuterol (VENTOLIN HFA) 108 (90 Base) MCG/ACT inhaler Inhale 1-2 puffs into the lungs every 6 (six) hours as needed. 8 g 2   diclofenac Sodium (VOLTAREN) 1 % GEL Apply 2 grams to affected area 4 (four) times daily. 700 g 1   diphenhydrAMINE (BENADRYL) 25 MG tablet Take 25 mg by mouth at bedtime as needed for allergies or sleep.     escitalopram (LEXAPRO) 10 MG tablet Take 1 tablet (10 mg total) by mouth daily. 90 tablet 1   etonogestrel (NEXPLANON) 68 MG IMPL implant 1 each by Subdermal route once.     fluticasone (FLONASE) 50 MCG/ACT nasal spray INSTILL 1 SPRAY INTO BOTH NOSTRILS DAILY 48 mL 3   fluticasone-salmeterol (WIXELA INHUB) 250-50 MCG/ACT AEPB INHALE 1 PUFF INTO THE LUNGS TWICE A DAY 180 each 3   LORazepam (ATIVAN) 1 MG tablet Take 1  tablet (1 mg total) by mouth as needed for anxiety (before procedure). (Patient not taking: Reported on 08/05/2021) 12 tablet 0   omeprazole (PRILOSEC) 20 MG capsule Take 1 capsule (20 mg total) by mouth daily. 90 capsule 1   phentermine (ADIPEX-P) 37.5 MG tablet Take 18.75 mg by mouth every morning. (Patient not taking: Reported on 08/05/2021)     rosuvastatin (CRESTOR) 10 MG tablet Take 1 tablet (10 mg total) by mouth daily. 90 tablet 1   No facility-administered medications prior to visit.      Review of Systems  Review of Systems   Physical Exam  There were no vitals taken for this visit. Physical Exam   Lab Results:  CBC    Component Value Date/Time   WBC 10.3 07/23/2021 0924   RBC 4.77 07/23/2021 0924   HGB 14.1 07/23/2021 0924   HGB 13.0 02/28/2016 1117   HCT 44.5 07/23/2021 0924   HCT 39.3 02/28/2016 1117  PLT 280 07/23/2021 0924   PLT 296 02/28/2016 1117   MCV 93.3 07/23/2021 0924   MCV 88 02/28/2016 1117   MCH 29.6 07/23/2021 0924   MCHC 31.7 07/23/2021 0924   RDW 14.1 07/23/2021 0924   RDW 15.8 (H) 02/28/2016 1117   LYMPHSABS 2.3 07/23/2021 0924   MONOABS 0.5 07/23/2021 0924   EOSABS 0.1 07/23/2021 0924   BASOSABS 0.1 07/23/2021 0924    BMET    Component Value Date/Time   NA 141 07/23/2021 0924   NA 143 02/28/2016 1117   K 4.5 07/23/2021 0924   CL 108 07/23/2021 0924   CO2 24 07/23/2021 0924   GLUCOSE 52 (L) 07/23/2021 0924   BUN 11 07/23/2021 0924   BUN 9 02/28/2016 1117   CREATININE 0.85 07/23/2021 0924   CALCIUM 9.4 07/23/2021 0924   GFRNONAA >60 07/23/2021 0924   GFRAA >60 10/19/2019 1455    BNP No results found for: "BNP"  ProBNP No results found for: "PROBNP"  Imaging: No results found.   Assessment & Plan:   No problem-specific Assessment & Plan notes found for this encounter.     Martyn Ehrich, NP 01/14/2022

## 2022-01-21 DIAGNOSIS — Z6841 Body Mass Index (BMI) 40.0 and over, adult: Secondary | ICD-10-CM | POA: Diagnosis not present

## 2022-01-21 DIAGNOSIS — Z713 Dietary counseling and surveillance: Secondary | ICD-10-CM | POA: Diagnosis not present

## 2022-01-21 DIAGNOSIS — R7303 Prediabetes: Secondary | ICD-10-CM | POA: Diagnosis not present

## 2022-01-21 DIAGNOSIS — E668 Other obesity: Secondary | ICD-10-CM | POA: Diagnosis not present

## 2022-01-21 DIAGNOSIS — Z760 Encounter for issue of repeat prescription: Secondary | ICD-10-CM | POA: Diagnosis not present

## 2022-01-28 ENCOUNTER — Other Ambulatory Visit (HOSPITAL_COMMUNITY)
Admission: RE | Admit: 2022-01-28 | Discharge: 2022-01-28 | Disposition: A | Discharge status: Home / Self Care | Payer: Medicaid Other | Source: Ambulatory Visit | Attending: Physician Assistant | Admitting: Physician Assistant

## 2022-01-28 ENCOUNTER — Encounter: Payer: Self-pay | Admitting: Physician Assistant

## 2022-01-28 ENCOUNTER — Ambulatory Visit: Payer: Medicaid Other | Admitting: Physician Assistant

## 2022-01-28 VITALS — BP 137/86 | HR 100 | Temp 97.1°F | Ht 64.0 in | Wt 302.2 lb

## 2022-01-28 DIAGNOSIS — N898 Other specified noninflammatory disorders of vagina: Secondary | ICD-10-CM | POA: Insufficient documentation

## 2022-01-28 DIAGNOSIS — R350 Frequency of micturition: Secondary | ICD-10-CM | POA: Diagnosis not present

## 2022-01-28 LAB — POCT URINALYSIS DIPSTICK
Bilirubin, UA: NEGATIVE
Blood, UA: NEGATIVE
Glucose, UA: NEGATIVE
Ketones, UA: POSITIVE
Nitrite, UA: NEGATIVE
Protein, UA: POSITIVE — AB
Spec Grav, UA: 1.025 (ref 1.010–1.025)
Urobilinogen, UA: 0.2 E.U./dL
pH, UA: 6 (ref 5.0–8.0)

## 2022-01-28 NOTE — Progress Notes (Signed)
Subjective:    Patient ID: Dawn Galvan, female    DOB: 1975/04/16, 46 y.o.   MRN: [id number]  Chief Complaint  Patient presents with   Urinary Frequency    Pt in office for f/u and for urinary frequency and a little odor, has been a little itchy as well. Needs referral placed for colonoscopy    HPI Patient is in today for concerns of urinary frequency x several months. Sometimes feels like she cannot hold her urine. Some odor. Drinking a probiotic but not helping much. Drinking mostly water and cran-grape juice. Some carbonated water. No sodas. No caffeine in diet. Feels like her bladder might be dropping. Birthed two children. Some urinary incontinence.   No vaginal discharge.   Says she has been back to work & going to church.   Past Medical History:  Diagnosis Date   Abnormal chest x-ray 03/24/2017   Acute hypoxemic respiratory failure ([place]) 10/19/2019   Carpal tunnel syndrome    right   Carpal tunnel syndrome of left wrist 07/2013   COVID-19 10/16/2019   Epistaxis 05/06/2020   Family history of breast cancer    PALB2 Mutation in Mother   Family history of prostate cancer    GERD (gastroesophageal reflux disease)    Headache(784.0)    migraines or sinus   History of vertigo    states comes and goes; no current med.   Morbid obesity with BMI of 45.0-49.9, adult ([place]) 02/07/2014   Obesity    Osteoarthritis    Pneumonia due to COVID-19 virus 10/16/2019   Formatting of this note might be different from the original. Last Assessment & Plan:  Formatting of this note might be different from the original. Patient seen via telehealth visit for COVID-19 infection. Comorbid obesity and pre-DM. Reports symptoms for the past week, and positive test 3 days ago. Complains of persistent fever with Tmax of 100.7, chills, mild SOB, body aches which are improving   PONV (postoperative nausea and vomiting)    Rash 08/10/2013   right thigh   Rash of neck 05/06/2020   Sleep apnea    does  not wear cpap   Sore in mouth 05/06/2020   Sore throat 05/22/2019    Past Surgical History:  Procedure Laterality Date   CARPAL TUNNEL RELEASE Left 08/18/2013   Procedure: LEFT CARPAL TUNNEL RELEASE;  Surgeon: Tennis Must, MD;  Location: [facility];  Service: Orthopedics;  Laterality: Left;   CARPAL TUNNEL RELEASE Right 04/29/2017   Procedure: RIGHT CARPAL TUNNEL RELEASE;  Surgeon: Leanora Cover, MD;  Location: [facility];  Service: Orthopedics;  Laterality: Right;   CESAREAN SECTION     CHOLECYSTECTOMY     HERNIA REPAIR     x 2   HYSTEROPLASTY REPAIR OF UTERINE ANOMALY     HYSTEROSCOPY WITH D & C  01/13/2012   Procedure: DILATATION AND CURETTAGE /HYSTEROSCOPY;  Surgeon: Osborne Oman, MD;  Location: [facility];  Service: Gynecology;  Laterality: N/A;  Pap smear   LAPAROSCOPIC GASTRIC SLEEVE RESECTION  2016   TOE SURGERY Left    second toe. rod placed   WISDOM TOOTH EXTRACTION  11/20/10    Family History  Problem Relation Age of Onset   Hypertension Mother    Breast cancer Mother    Hypertension Father    Diabetes Maternal Grandmother    Diabetes Paternal Grandmother     Social History   Tobacco Use   Smoking status: Former    Years: 0.00  Types: Cigarettes    Quit date: 02/22/2009    Years since quitting: 12.9   Smokeless tobacco: Never  Vaping Use   Vaping Use: Never used  Substance Use Topics   Alcohol use: Yes    Comment: Social   Drug use: No     Allergies  Allergen Reactions   Doxycycline Nausea And Vomiting    Dizziness    Other Itching and Other (See Comments)    Seasonal allergies- Itchy eyes, runny nose, etc..    Review of Systems NEGATIVE UNLESS OTHERWISE INDICATED IN HPI      Objective:     BP 137/86 (BP Location: Right Arm)   Pulse 100   Temp (!) 97.1 F (36.2 C) (Temporal)   Ht _0  (1.626 m)   Wt (!) 302 lb 3.2 oz (137.1 kg)   SpO2 98%   BMI 51.87 kg/m   Wt Readings from Last 3 Encounters:  01/28/22 (!) 302 lb 3.2 oz (137.1 kg)  09/30/21  294 lb 9.6 oz (133.6 kg)  08/05/21 296 lb (134.3 kg)    BP Readings from Last 3 Encounters:  01/28/22 137/86  09/30/21 (!) 158/98  08/05/21 108/80     Physical Exam Vitals and nursing note reviewed.  Constitutional:      Appearance: Normal appearance. She is obese.  Cardiovascular:     Rate and Rhythm: Normal rate and regular rhythm.     Pulses: Normal pulses.     Heart sounds: No murmur heard. Pulmonary:     Effort: Pulmonary effort is normal.     Breath sounds: Normal breath sounds.  Abdominal:     General: Abdomen is flat. Bowel sounds are normal.     Palpations: Abdomen is soft.     Tenderness: There is no right CVA tenderness or left CVA tenderness.  Neurological:     Mental Status: She is alert.  Psychiatric:        Mood and Affect: Mood normal.        Behavior: Behavior normal.        Assessment & Plan:  Urinary frequency -     POCT urinalysis dipstick -     Cervicovaginal ancillary only -     Urine Culture  Vaginal odor -     Cervicovaginal ancillary only    Send for urine culture and vaginal swab. Treat pending results. Limit caffeine. Consider pelvic exam if normal results to r/o prolapse or other etiology.     Return if symptoms worsen or fail to improve.  This note was prepared with assistance of Systems analyst. Occasional wrong-word or sound-a-like substitutions may have occurred due to the inherent limitations of voice recognition software.     Brand Siever M Constantine Ruddick, PA-C

## 2022-01-29 LAB — URINE CULTURE
MICRO NUMBER:: 14278667
SPECIMEN QUALITY:: ADEQUATE

## 2022-01-30 LAB — CERVICOVAGINAL ANCILLARY ONLY
Bacterial Vaginitis (gardnerella): NEGATIVE
Candida Glabrata: NEGATIVE
Candida Vaginitis: NEGATIVE
Chlamydia: NEGATIVE
Comment: NEGATIVE
Comment: NEGATIVE
Comment: NEGATIVE
Comment: NEGATIVE
Comment: NEGATIVE
Comment: NORMAL
Neisseria Gonorrhea: NEGATIVE
Trichomonas: NEGATIVE

## 2022-02-02 ENCOUNTER — Telehealth (HOSPITAL_COMMUNITY): Payer: Self-pay | Admitting: Clinical

## 2022-02-02 NOTE — Telephone Encounter (Signed)
2 attempts made today regarding Ms Dawn Galvan complaints left on voicemail. Ms Dawn Galvan was very upset while trying to recify her complaints. Ms Dawn Galvan was informed during the call that she will be contacted tomorrow after speaking with you regarding a Wed schedule. Ms Dawn Galvan proceed with frustration and disconnected call.

## 2022-02-02 NOTE — Telephone Encounter (Signed)
Returned patients call to schedule with Paige.

## 2022-02-04 ENCOUNTER — Ambulatory Visit: Payer: Medicaid Other | Admitting: Physician Assistant

## 2022-02-11 ENCOUNTER — Ambulatory Visit (HOSPITAL_COMMUNITY): Payer: Medicaid Other | Admitting: Clinical

## 2022-02-16 ENCOUNTER — Encounter: Payer: Self-pay | Admitting: Physician Assistant

## 2022-02-25 ENCOUNTER — Ambulatory Visit: Payer: Medicaid Other | Admitting: Physician Assistant

## 2022-02-25 ENCOUNTER — Encounter: Payer: Self-pay | Admitting: Physician Assistant

## 2022-02-25 NOTE — Telephone Encounter (Signed)
FYI for patient not coming to her appointment

## 2022-03-04 ENCOUNTER — Ambulatory Visit (INDEPENDENT_AMBULATORY_CARE_PROVIDER_SITE_OTHER): Payer: Medicaid Other | Admitting: Clinical

## 2022-03-04 DIAGNOSIS — F332 Major depressive disorder, recurrent severe without psychotic features: Secondary | ICD-10-CM | POA: Diagnosis not present

## 2022-03-05 ENCOUNTER — Ambulatory Visit: Payer: Medicaid Other | Admitting: Physician Assistant

## 2022-03-05 ENCOUNTER — Encounter: Payer: Self-pay | Admitting: Physician Assistant

## 2022-03-05 ENCOUNTER — Other Ambulatory Visit (INDEPENDENT_AMBULATORY_CARE_PROVIDER_SITE_OTHER): Payer: Medicaid Other

## 2022-03-05 DIAGNOSIS — R739 Hyperglycemia, unspecified: Secondary | ICD-10-CM

## 2022-03-05 DIAGNOSIS — Z1211 Encounter for screening for malignant neoplasm of colon: Secondary | ICD-10-CM | POA: Diagnosis not present

## 2022-03-05 DIAGNOSIS — Z6841 Body Mass Index (BMI) 40.0 and over, adult: Secondary | ICD-10-CM

## 2022-03-05 DIAGNOSIS — E782 Mixed hyperlipidemia: Secondary | ICD-10-CM | POA: Diagnosis not present

## 2022-03-05 LAB — CBC WITH DIFFERENTIAL/PLATELET
Basophils Absolute: 0.1 10*3/uL (ref 0.0–0.1)
Basophils Relative: 1.1 % (ref 0.0–3.0)
Eosinophils Absolute: 0.1 10*3/uL (ref 0.0–0.7)
Eosinophils Relative: 1.2 % (ref 0.0–5.0)
HCT: 42.2 % (ref 36.0–46.0)
Hemoglobin: 13.6 g/dL (ref 12.0–15.0)
Lymphocytes Relative: 27.8 % (ref 12.0–46.0)
Lymphs Abs: 3.1 10*3/uL (ref 0.7–4.0)
MCHC: 32.1 g/dL (ref 30.0–36.0)
MCV: 90.4 fl (ref 78.0–100.0)
Monocytes Absolute: 0.5 10*3/uL (ref 0.1–1.0)
Monocytes Relative: 4.2 % (ref 3.0–12.0)
Neutro Abs: 7.2 10*3/uL (ref 1.4–7.7)
Neutrophils Relative %: 65.7 % (ref 43.0–77.0)
Platelets: 303 10*3/uL (ref 150.0–400.0)
RBC: 4.67 Mil/uL (ref 3.87–5.11)
RDW: 14.1 % (ref 11.5–15.5)
WBC: 11 10*3/uL — ABNORMAL HIGH (ref 4.0–10.5)

## 2022-03-05 LAB — TSH: TSH: 1.9 u[IU]/mL (ref 0.35–5.50)

## 2022-03-05 MED ORDER — SAXENDA 18 MG/3ML ~~LOC~~ SOPN: 0.6000 mg | PEN_INJECTOR | Freq: Every day | SUBCUTANEOUS | 0 refills | Status: DC

## 2022-03-05 NOTE — Progress Notes (Signed)
Subjective:    Patient ID: Dawn Galvan, female    DOB: 1975/08/18, 47 y.o.   MRN: [id number]  Chief Complaint  Patient presents with   Follow-up    Pt here to see if she can get on medication for weight management     HPI Patient is in today for weight concerns.  Hx of gastric sleeve surgery; she was hoping to reverse and do bypass instead. Insurance will only cover her to go to [facility]. She has been seeing them 2-3 years now. - They had her on phentermine; has been off this for the last few weeks.  - Previously tried Topamax also, but gave her migraines; did not lose weight. - Doing some exercises at home; walking her dog during the week - She is hoping to see a nutritionist, but can't afford it without assistance   She is hoping to have labs including thyroid checked today.   Breast reduction surgery - has to be 240 lbs to have this done.   Lowest weight in awhile is 270 lbs in 2021 per patient.   Past Medical History:  Diagnosis Date   Abnormal chest x-ray 03/24/2017   Acute hypoxemic respiratory failure ([place]) 10/19/2019   Carpal tunnel syndrome    right   Carpal tunnel syndrome of left wrist 07/2013   COVID-19 10/16/2019   Epistaxis 05/06/2020   Family history of breast cancer    PALB2 Mutation in Mother   Family history of prostate cancer    GERD (gastroesophageal reflux disease)    Headache(784.0)    migraines or sinus   History of vertigo    states comes and goes; no current med.   Morbid obesity with BMI of 45.0-49.9, adult ([place]) 02/07/2014   Obesity    Osteoarthritis    Pneumonia due to COVID-19 virus 10/16/2019   Formatting of this note might be different from the original. Last Assessment & Plan:  Formatting of this note might be different from the original. Patient seen via telehealth visit for COVID-19 infection. Comorbid obesity and pre-DM. Reports symptoms for the past week, and positive test 3 days ago. Complains of persistent fever with Tmax  of 100.7, chills, mild SOB, body aches which are improving   PONV (postoperative nausea and vomiting)    Rash 08/10/2013   right thigh   Rash of neck 05/06/2020   Sleep apnea    does not wear cpap   Sore in mouth 05/06/2020   Sore throat 05/22/2019    Past Surgical History:  Procedure Laterality Date   CARPAL TUNNEL RELEASE Left 08/18/2013   Procedure: LEFT CARPAL TUNNEL RELEASE;  Surgeon: Tennis Must, MD;  Location: [facility];  Service: Orthopedics;  Laterality: Left;   CARPAL TUNNEL RELEASE Right 04/29/2017   Procedure: RIGHT CARPAL TUNNEL RELEASE;  Surgeon: Leanora Cover, MD;  Location: [facility];  Service: Orthopedics;  Laterality: Right;   CESAREAN SECTION     CHOLECYSTECTOMY     HERNIA REPAIR     x 2   HYSTEROPLASTY REPAIR OF UTERINE ANOMALY     HYSTEROSCOPY WITH D & C  01/13/2012   Procedure: DILATATION AND CURETTAGE /HYSTEROSCOPY;  Surgeon: Osborne Oman, MD;  Location: [facility];  Service: Gynecology;  Laterality: N/A;  Pap smear   LAPAROSCOPIC GASTRIC SLEEVE RESECTION  2016   TOE SURGERY Left    second toe. rod placed   WISDOM TOOTH EXTRACTION  11/20/10    Family History  Problem Relation Age of  Onset   Hypertension Mother    Breast cancer Mother    Hypertension Father    Diabetes Maternal Grandmother    Diabetes Paternal Grandmother     Social History   Tobacco Use   Smoking status: Former    Years: 0.00    Types: Cigarettes    Quit date: 02/22/2009    Years since quitting: 13.0   Smokeless tobacco: Never  Vaping Use   Vaping Use: Never used  Substance Use Topics   Alcohol use: Yes    Comment: Social   Drug use: No     Allergies  Allergen Reactions   Doxycycline Nausea And Vomiting    Dizziness    Other Itching and Other (See Comments)    Seasonal allergies- Itchy eyes, runny nose, etc..    Review of Systems NEGATIVE UNLESS OTHERWISE INDICATED IN HPI      Objective:     BP 117/85 (BP Location: Right Wrist, Patient Position: Sitting)   Pulse 90    Temp (!) 97.3 F (36.3 C) (Temporal)   Ht 5\' 4"  (1.626 m)   Wt (!) 309 lb 3.2 oz (140.3 kg)   SpO2 96%   BMI 53.07 kg/m   Wt Readings from Last 3 Encounters:  03/05/22 (!) 309 lb 3.2 oz (140.3 kg)  01/28/22 (!) 302 lb 3.2 oz (137.1 kg)  09/30/21 294 lb 9.6 oz (133.6 kg)    BP Readings from Last 3 Encounters:  03/05/22 117/85  01/28/22 137/86  09/30/21 (!) 158/98     Physical Exam Vitals and nursing note reviewed.  Constitutional:      Appearance: Normal appearance. She is obese.  Eyes:     Extraocular Movements: Extraocular movements intact.     Conjunctiva/sclera: Conjunctivae normal.     Pupils: Pupils are equal, round, and reactive to light.  Cardiovascular:     Rate and Rhythm: Normal rate and regular rhythm.     Pulses: Normal pulses.     Heart sounds: No murmur heard. Pulmonary:     Effort: Pulmonary effort is normal.     Breath sounds: Normal breath sounds.  Neurological:     General: No focal deficit present.     Mental Status: She is alert and oriented to person, place, and time.  Psychiatric:        Mood and Affect: Mood normal.        Behavior: Behavior normal.        Assessment & Plan:  Morbid obesity (HCC) Assessment & Plan: Still following with bariatric specialists.  Pt asking me to send [employer] for her; will try to get this approved for added work towards her goals. Pt aware of risks vs benefits and possible adverse reactions.   Orders: -     POCT glycosylated hemoglobin (Hb A1C) -     Saxenda; Inject 0.6 mg into the skin daily.  Dispense: 3 mL; Refill: 0 -     Lipid panel -     CBC with Differential/Platelet; Future -     Comprehensive metabolic panel; Future -     Insulin, random; Future -     TSH; Future  BMI 50.0-59.9, adult (HCC) -     Saxenda; Inject 0.6 mg into the skin daily.  Dispense: 3 mL; Refill: 0 -     CBC with Differential/Platelet; Future -     Comprehensive metabolic panel; Future  Hyperglycemia -     POCT glycosylated  hemoglobin (Hb A1C) -  Saxenda; Inject 0.6 mg into the skin daily.  Dispense: 3 mL; Refill: 0 -     Comprehensive metabolic panel; Future  Mixed hyperlipidemia Assessment & Plan: Crestor 10 mg daily  Recheck labs today  Orders: -     Saxenda; Inject 0.6 mg into the skin daily.  Dispense: 3 mL; Refill: 0 -     Lipid panel  Screening for colon cancer -     Ambulatory referral to Gastroenterology        Return in about 3 months (around 06/04/2022) for recheck.    Charley Lafrance M Zulema [place], PA-C

## 2022-03-06 ENCOUNTER — Encounter: Payer: Self-pay | Admitting: Physician Assistant

## 2022-03-06 LAB — COMPREHENSIVE METABOLIC PANEL
ALT: 11 U/L (ref 0–35)
AST: 17 U/L (ref 0–37)
Albumin: 4.2 g/dL (ref 3.5–5.2)
Alkaline Phosphatase: 76 U/L (ref 39–117)
BUN: 10 mg/dL (ref 6–23)
CO2: 25 mEq/L (ref 19–32)
Calcium: 9.7 mg/dL (ref 8.4–10.5)
Chloride: 104 mEq/L (ref 96–112)
Creatinine, Ser: 0.73 mg/dL (ref 0.40–1.20)
GFR: 98.87 mL/min (ref >60.00)
Glucose, Bld: 95 mg/dL (ref 70–99)
Potassium: 4.2 mEq/L (ref 3.5–5.1)
Sodium: 141 mEq/L (ref 135–145)
Total Bilirubin: 0.7 mg/dL (ref 0.2–1.2)
Total Protein: 8 g/dL (ref 6.0–8.3)

## 2022-03-06 LAB — INSULIN, RANDOM: Insulin: 36.3 u[IU]/mL — ABNORMAL HIGH

## 2022-03-07 NOTE — Progress Notes (Signed)
THERAPIST PROGRESS NOTE Virtual Visit via Video Note  I connected with Dawn Galvan on 03/04/2022 at  1:00 PM EST by a video enabled telemedicine application and verified that I am speaking with the correct person using two identifiers.  Location: Patient: home Provider: office   I discussed the limitations of evaluation and management by telemedicine and the availability of in person appointments. The patient expressed understanding and agreed to proceed.   Follow Up Instructions: I discussed the assessment and treatment plan with the patient. The patient was provided an opportunity to ask questions and all were answered. The patient agreed with the plan and demonstrated an understanding of the instructions.   The patient was advised to call back or seek an in-person evaluation if the symptoms worsen or if the condition fails to improve as anticipated.    Session Time: 40 minutes  Participation Level: Active  Behavioral Response: CasualAlertEuthymic  Type of Therapy: Individual Therapy  Treatment Goals addressed: client will identify 3 cognitive patterns and beliefs that support depression   ProgressTowards Goals: Progressing  Interventions: CBT  Summary:  Dawn Galvan is a 47 y.o. female who presents for the scheduled appointment oriented times five, appropriately dressed, and friendly. Client denied hallucinations and delusions.  Client reported she is doing okay but having some stress. Client reported she lost her job. 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 Client reported they found out and terminated her. Client reported she knows it was wrong but she got desperate because she needed money to make payments on the car her mother has let her use. Client reported she cried and was in bed for a few days. Client reported she has positive support that helped to get her up and out of bed. Client  reported she has been applying to other jobs. Client reported she has been going to church which has kept her grounded. Client reported her father has been positive support to her too. Client reported she needs help with not taking things personal and learning to not make choices so quickly and take time to consider consequences.  Evidence of progress towards goal:  Client reported 1 negative behavioral thought process regarding making impulsive decisions that contribute to anxiety and depression.   Suicidal/Homicidal: Nowithout intent/plan  Therapist Response:  Therapist began the appointment asking the client how she has been doing since last seen. Therapist used CBT to engage using active listening and positive emotional support. Therapist used CBT to engage and ask th client to discuss her stressors and how she coped with them. Therapist used CBT to engage discuss rationalizing thoughts, delayed gratification, and journaling. Therapist used CBT ask the client to identify her progress with frequency of use with coping skills with continued practice in her daily activity.    Therapist assigned the client homework to practice self care.   Plan: Return again in 3 weeks.  Diagnosis: severe episode of recurrent major depressive disorder, without psychotic features  Collaboration of Care: Patient refused AEB none requested by the client.  Patient/Guardian was advised Release of Information must be obtained prior to any record release in order to collaborate their care with an outside provider. Patient/Guardian was advised if they have not already done so to contact the registration department to sign all necessary forms in order for [place] to release information regarding their care.   Consent: Patient/Guardian gives verbal consent for treatment and assignment of  benefits for services provided during this visit. Patient/Guardian expressed understanding and agreed to proceed.   [place],  LCSW 03/04/2022

## 2022-03-08 LAB — POCT GLYCOSYLATED HEMOGLOBIN (HGB A1C): HbA1c POC (<> result, manual entry): 5.6 % (ref 4.0–5.6)

## 2022-03-08 NOTE — Assessment & Plan Note (Signed)
Still following with bariatric specialists.  Pt asking me to send [place] for her; will try to get this approved for added work towards her goals. Pt aware of risks vs benefits and possible adverse reactions.

## 2022-03-08 NOTE — Assessment & Plan Note (Signed)
Crestor 10 mg daily  Recheck labs today

## 2022-03-09 ENCOUNTER — Other Ambulatory Visit: Payer: Self-pay

## 2022-03-09 ENCOUNTER — Encounter: Payer: Self-pay | Admitting: Physician Assistant

## 2022-03-09 ENCOUNTER — Other Ambulatory Visit: Payer: Self-pay | Admitting: Physician Assistant

## 2022-03-09 DIAGNOSIS — Z6841 Body Mass Index (BMI) 40.0 and over, adult: Secondary | ICD-10-CM

## 2022-03-09 DIAGNOSIS — R739 Hyperglycemia, unspecified: Secondary | ICD-10-CM

## 2022-03-09 DIAGNOSIS — E782 Mixed hyperlipidemia: Secondary | ICD-10-CM

## 2022-03-09 MED ORDER — SAXENDA 18 MG/3ML ~~LOC~~ SOPN
0.6000 mg | PEN_INJECTOR | Freq: Every day | SUBCUTANEOUS | 0 refills | Status: DC
  Filled 2022-03-09: qty 3, 30d supply, fill #0

## 2022-03-09 NOTE — Telephone Encounter (Signed)
Please see pt msg and advise 

## 2022-03-09 NOTE — Telephone Encounter (Signed)
Already addressed, duplicate message

## 2022-03-10 ENCOUNTER — Other Ambulatory Visit: Payer: Self-pay

## 2022-03-13 ENCOUNTER — Other Ambulatory Visit: Payer: Self-pay

## 2022-03-13 NOTE — Telephone Encounter (Signed)
Please see pt msgs

## 2022-03-13 NOTE — Telephone Encounter (Signed)
Please see pt msg and advise 

## 2022-03-16 NOTE — Telephone Encounter (Signed)
Please see pt response; pt was advised you had tried several different options as far as pharmacies not different medications.

## 2022-03-17 ENCOUNTER — Other Ambulatory Visit: Payer: Self-pay

## 2022-03-23 NOTE — Telephone Encounter (Signed)
Please see pt response.

## 2022-03-24 ENCOUNTER — Encounter: Payer: Self-pay | Admitting: Physician Assistant

## 2022-03-24 DIAGNOSIS — Z7189 Other specified counseling: Secondary | ICD-10-CM | POA: Diagnosis not present

## 2022-03-24 DIAGNOSIS — Z760 Encounter for issue of repeat prescription: Secondary | ICD-10-CM | POA: Diagnosis not present

## 2022-03-24 DIAGNOSIS — E668 Other obesity: Secondary | ICD-10-CM | POA: Diagnosis not present

## 2022-03-24 DIAGNOSIS — E161 Other hypoglycemia: Secondary | ICD-10-CM | POA: Diagnosis not present

## 2022-03-24 DIAGNOSIS — Z6841 Body Mass Index (BMI) 40.0 and over, adult: Secondary | ICD-10-CM | POA: Diagnosis not present

## 2022-03-24 DIAGNOSIS — Z7689 Persons encountering health services in other specified circumstances: Secondary | ICD-10-CM | POA: Diagnosis not present

## 2022-03-25 ENCOUNTER — Other Ambulatory Visit: Payer: Self-pay | Admitting: Physician Assistant

## 2022-03-25 ENCOUNTER — Ambulatory Visit (INDEPENDENT_AMBULATORY_CARE_PROVIDER_SITE_OTHER): Payer: Medicaid Other | Admitting: Clinical

## 2022-03-25 ENCOUNTER — Telehealth: Payer: Self-pay | Admitting: Physician Assistant

## 2022-03-25 DIAGNOSIS — R739 Hyperglycemia, unspecified: Secondary | ICD-10-CM

## 2022-03-25 DIAGNOSIS — F332 Major depressive disorder, recurrent severe without psychotic features: Secondary | ICD-10-CM

## 2022-03-25 DIAGNOSIS — E88819 Insulin resistance, unspecified: Secondary | ICD-10-CM

## 2022-03-25 DIAGNOSIS — S99922A Unspecified injury of left foot, initial encounter: Secondary | ICD-10-CM | POA: Diagnosis not present

## 2022-03-25 MED ORDER — BLOOD GLUCOSE TEST VI STRP: 1.0000 | ORAL_STRIP | Freq: Three times a day (TID) | 0 refills | Status: DC

## 2022-03-25 MED ORDER — LANCETS MISC. MISC: 1.0000 | Freq: Three times a day (TID) | 0 refills | Status: AC

## 2022-03-25 MED ORDER — BLOOD GLUCOSE MONITORING SUPPL DEVI: 1.0000 | Freq: Three times a day (TID) | 0 refills | Status: DC

## 2022-03-25 MED ORDER — LANCET DEVICE MISC: 1.0000 | Freq: Three times a day (TID) | 0 refills | Status: AC

## 2022-03-25 NOTE — Telephone Encounter (Signed)
Please see pt msg 

## 2022-03-25 NOTE — Telephone Encounter (Signed)
Patient states she has had glass in her foot since last night. Informed patient that she would need to go to [facility] after consulting [facility]. Pt refused to go due to possibly long wait times and anxiety being triggered by this.  Patient requested to be seen either by PCP or another provider either this afternoon or morning. Informed patient there were no available appointments. While seeing if there were other options, patient disconnected call.

## 2022-03-25 NOTE — Progress Notes (Signed)
   THERAPIST PROGRESS NOTE Virtual Visit via Video Note  I connected with Dawn Galvan on 03/25/22 at  1:00 PM EST by a video enabled telemedicine application and verified that I am speaking with the correct person using two identifiers.  Location: Patient: home Provider: office   I discussed the limitations of evaluation and management by telemedicine and the availability of in person appointments. The patient expressed understanding and agreed to proceed.   Follow Up Instructions: I discussed the assessment and treatment plan with the patient. The patient was provided an opportunity to ask questions and all were answered. The patient agreed with the plan and demonstrated an understanding of the instructions.   The patient was advised to call back or seek an in-person evaluation if the symptoms worsen or if the condition fails to improve as anticipated.    Session Time: 40 minutes  Participation Level: Active  Behavioral Response: CasualAlertEuthymic  Type of Therapy: Individual Therapy  Treatment Goals addressed: client will reduce overall depression score by a minimum of 25% on the PHQ-9  ProgressTowards Goals: Progressing  Interventions: CBT and Supportive  Summary:  Dawn Galvan is a 47 y.o. female who presents for the scheduled appointment oriented times five, appropriately dressed, and friendly. Client denied hallucinations and delusions. Client reported she is doing better than the last appointment. Client reported she has 2 jobs that she is about to start. Client reported she used to work at both places prior. Client reported other good news is her dad will be paying her mother the remainder money to pay off the car. Client reported she is feeling very happy in that aspect. Client reported she has had problem with her mother. Client reported her mother is having issues and has been calling her frequently. Client reported it has been overwhelming. Client reported  not knowing how to navigate conversations with her. Client reported overall she does have moments of anxiety feeling overwhelmed about situations. Evidence of progress towards goal:  client reported accomplishing 1 goal of obtaining employment.    Suicidal/Homicidal: Nowithout intent/plan  Therapist Response:  Therapist began the appointment asking the client how she has been doing since last seen. Therapist used CBT to engage using active listening and positive emotional support. Therapist used CBT to engage and ask the client to describe her sources of anxiety. Therapist used CBT to engage discuss emotion regulation. Therapist used CBT ask the client to identify her progress with frequency of use with coping skills with continued practice in her daily activity.    Therapist assigned the client homework to practice discussed skill for emotion regulation.    Plan: Return again in 3 weeks.  Diagnosis: severe episode of recurrent major depressive disorder, without psychotic features  Collaboration of Care: Patient refused AEB none requested by the client.  Patient/Guardian was advised Release of Information must be obtained prior to any record release in order to collaborate their care with an outside provider. Patient/Guardian was advised if they have not already done so to contact the registration department to sign all necessary forms in order for [place] to release information regarding their care.   Consent: Patient/Guardian gives verbal consent for treatment and assignment of benefits for services provided during this visit. Patient/Guardian expressed understanding and agreed to proceed.   [place], LCSW 03/25/2022

## 2022-03-26 ENCOUNTER — Other Ambulatory Visit: Payer: Self-pay | Admitting: *Deleted

## 2022-03-26 ENCOUNTER — Encounter: Payer: Self-pay | Admitting: Hematology and Oncology

## 2022-03-26 DIAGNOSIS — Z1509 Genetic susceptibility to other malignant neoplasm: Secondary | ICD-10-CM

## 2022-04-03 ENCOUNTER — Ambulatory Visit (HOSPITAL_COMMUNITY): Payer: Medicaid Other

## 2022-04-09 ENCOUNTER — Telehealth: Payer: Self-pay

## 2022-04-09 NOTE — Telephone Encounter (Signed)
Prepping chart noted that pts BMI > 50. Dr. Lyndel Safe please advise if you would like an OV or direct to the hospital. Her PV and colonoscopy will be cancelled for now until we get a procedure date for her at the hospital.

## 2022-04-09 NOTE — Telephone Encounter (Signed)
OV scheduled for 04/16/22 at 1:50 PM

## 2022-04-09 NOTE — Telephone Encounter (Signed)
Pl do OV first RG

## 2022-04-09 NOTE — Telephone Encounter (Signed)
Called and spoke with patient to let her know due to her BMI and safety measure she was not a candidate for a procedure at [facility]. Pt understands that a nurse will reach out to her to schedule an OV or direct date for the hospital. Pt will need a new PV schedule if direct admit for procedure. Thank you.

## 2022-04-12 ENCOUNTER — Encounter: Payer: Self-pay | Admitting: Physician Assistant

## 2022-04-13 ENCOUNTER — Other Ambulatory Visit: Payer: Self-pay

## 2022-04-13 DIAGNOSIS — R739 Hyperglycemia, unspecified: Secondary | ICD-10-CM

## 2022-04-13 DIAGNOSIS — E88819 Insulin resistance, unspecified: Secondary | ICD-10-CM

## 2022-04-13 MED ORDER — BLOOD GLUCOSE MONITORING SUPPL DEVI: 1.0000 | Freq: Three times a day (TID) | 0 refills | Status: DC

## 2022-04-15 ENCOUNTER — Ambulatory Visit (HOSPITAL_COMMUNITY): Payer: Medicaid Other | Admitting: Clinical

## 2022-04-15 ENCOUNTER — Encounter (HOSPITAL_COMMUNITY): Payer: Self-pay

## 2022-04-16 ENCOUNTER — Encounter: Payer: Self-pay | Admitting: Gastroenterology

## 2022-04-16 ENCOUNTER — Ambulatory Visit: Payer: Medicaid Other | Admitting: Gastroenterology

## 2022-04-16 VITALS — BP 119/76 | HR 99 | Ht 64.0 in | Wt 310.0 lb

## 2022-04-16 DIAGNOSIS — Z01818 Encounter for other preprocedural examination: Secondary | ICD-10-CM | POA: Diagnosis not present

## 2022-04-16 DIAGNOSIS — Z1212 Encounter for screening for malignant neoplasm of rectum: Secondary | ICD-10-CM | POA: Diagnosis not present

## 2022-04-16 DIAGNOSIS — Z1211 Encounter for screening for malignant neoplasm of colon: Secondary | ICD-10-CM | POA: Diagnosis not present

## 2022-04-16 NOTE — Progress Notes (Signed)
Chief Complaint:   Referring Provider:  Allwardt, Randa Evens, PA-C      ASSESSMENT AND PLAN;   #1. CRC screening  #2.   Plan: -Colon at [facility] d/t BMI>50   Discussed risks & benefits of colonoscopy. Risks including rare perforation req laparotomy, bleeding after bx/polypectomy req blood transfusion, rarely missing neoplasms, risks of anesthesia/sedation, rare risk of damage to internal organs. Benefits outweigh the risks. Patient agrees to proceed. All the questions were answered. Pt consents to proceed. HPI:    Dawn Galvan is a 47 y.o. female  With morbid obesity S/P sleeve gastrectomy 2016, HLD, borderline DM, GERD, DJD, history of COVID-pneumonia  For CRC screening  No nausea, vomiting, heartburn (better with omeprazole), regurgitation, odynophagia or dysphagia.  No significant diarrhea or constipation.  No melena or hematochezia. No unintentional weight loss. No abdominal pain.  Wt Readings from Last 3 Encounters:  04/16/22 (!) 310 lb (140.6 kg)  03/05/22 (!) 309 lb 3.2 oz (140.3 kg)  01/28/22 (!) 302 lb 3.2 oz (137.1 kg)    Previous GI workup: Colonoscopy 01/2007 ([facility]): Small internal hemorrhoids.  Otherwise normal to cecum. Rpt 10 yrs.   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   Morbid obesity with BMI of 45.0-49.9, adult ([place]) 02/07/2014   Obesity    Osteoarthritis    Plantar fasciitis    Pneumonia due to COVID-19 virus 10/16/2019   Formatting of  this note might be different from the original. Last Assessment & Plan:  Formatting of this note might be different from the original. Patient seen via telehealth visit for COVID-19 infection. Comorbid obesity and pre-DM. Reports symptoms for the past week, and positive test 3 days ago. Complains of persistent fever with Tmax of 100.7, chills, mild SOB, body aches which are improving   PONV (postoperative nausea and vomiting)    Rash 08/10/2013   right thigh   Rash of neck 05/06/2020   Sleep apnea    does not wear cpap   Sore in mouth 05/06/2020   Sore throat 05/22/2019   Tenosynovitis     Past Surgical History:  Procedure Laterality Date   CARPAL TUNNEL RELEASE Left 08/18/2013   Procedure: LEFT CARPAL TUNNEL RELEASE;  Surgeon: Tennis Must, MD;  Location: [place];  Service: Orthopedics;  Laterality: Left;   CARPAL TUNNEL RELEASE Right 04/29/2017   Procedure: RIGHT CARPAL TUNNEL RELEASE;  Surgeon: Leanora Cover, MD;  Location: [place];  Service: Orthopedics;  Laterality: Right;   CESAREAN SECTION     CHOLECYSTECTOMY     COLONOSCOPY  01/24/2007   Small internal hemorrhoids (most likey etiology of bright red blood per rectum). Otherwise normal colonoscopy to cecum   HERNIA REPAIR     x 2   HYSTEROPLASTY REPAIR OF UTERINE ANOMALY     HYSTEROSCOPY WITH D & C  01/13/2012   Procedure: DILATATION AND CURETTAGE /HYSTEROSCOPY;  Surgeon: Osborne Oman, MD;  Location: [facility];  Service: Gynecology;  Laterality: N/A;  Pap smear   LAPAROSCOPIC GASTRIC SLEEVE RESECTION  2016   TOE SURGERY Left    second toe. rod placed   WISDOM TOOTH EXTRACTION  11/20/2010    Family History  Problem Relation Age of Onset   Hypertension Mother    Breast cancer Mother    Hypertension Father    Diabetes Maternal Grandmother    Diabetes Paternal Grandmother     Social History   Tobacco Use   Smoking status: Former    Years: 0.00    Types: Cigarettes    Quit date: 02/22/2009    Years since quitting: 13.1    Smokeless tobacco: Never  Vaping Use   Vaping Use: Never used  Substance Use Topics   Alcohol use: Not Currently    Comment: Social   Drug use: No    Current Outpatient Medications  Medication Sig Dispense Refill   albuterol (VENTOLIN HFA) 108 (90 Base) MCG/ACT inhaler Inhale 1-2 puffs into the lungs every 6 (six) hours as needed. 8 g 2   Blood Glucose Monitoring Suppl DEVI 1 each by Does not apply route in the morning, at noon, and at bedtime. May substitute to any manufacturer covered by patient's insurance. 1 each 0   diclofenac Sodium (VOLTAREN) 1 % GEL Apply 2 grams to affected area 4 (four) times daily. 700 g 1   diphenhydrAMINE (BENADRYL) 25 MG tablet Take 25 mg by mouth at bedtime as needed for allergies or sleep.     etonogestrel (NEXPLANON) 68 MG IMPL implant 1 each by Subdermal route once.     fluticasone (FLONASE) 50 MCG/ACT nasal spray INSTILL 1 SPRAY INTO BOTH NOSTRILS DAILY 48 mL 3   fluticasone-salmeterol (WIXELA INHUB) 250-50 MCG/ACT AEPB INHALE 1 PUFF INTO THE LUNGS TWICE A DAY 180 each 3   Glucose Blood (BLOOD GLUCOSE TEST STRIPS) STRP 1 each by In Vitro route in the morning, at noon, and at bedtime. May substitute to any manufacturer covered by patient's insurance. 100 strip 0   Lancet Device MISC 1 each by Does not apply route in the morning, at noon, and at bedtime. May substitute to any manufacturer covered by patient's insurance. 1 each 0   Lancets Misc. MISC 1 each by Does not apply route in the morning, at noon, and at bedtime. May substitute to any manufacturer covered by patient's insurance. 100 each 0   LORazepam (ATIVAN) 1 MG tablet Take 1 tablet (1 mg total) by mouth as needed for anxiety (before procedure). 12 tablet 0   omeprazole (PRILOSEC) 20 MG capsule Take 1 capsule (20 mg total) by mouth daily. 90 capsule 1   phentermine (ADIPEX-P) 37.5 MG tablet Take 18.75 mg by mouth every morning.     rosuvastatin (CRESTOR) 10 MG tablet Take 1 tablet (10 mg total)  by mouth daily. 90 tablet 1   acetaminophen (TYLENOL 8 HOUR) 650 MG CR tablet Take 1 tablet (650 mg total) by mouth every 8 (eight) hours as needed for pain. (Patient not taking: Reported on 04/16/2022) 30 tablet 0   escitalopram (LEXAPRO) 10 MG tablet Take 1 tablet (10 mg total) by mouth daily. (Patient not taking: Reported on 04/16/2022) 90 tablet 1     3 mL 0   No current facility-administered medications for this visit.    Allergies  Allergen Reactions   Doxycycline Nausea And Vomiting    Dizziness    Other Itching and  Other (See Comments)    Seasonal allergies- Itchy eyes, runny nose, etc..    Review of Systems:  Constitutional: Denies fever, chills, diaphoresis, appetite change and fatigue.  HEENT: Denies photophobia, eye pain, redness, hearing loss, ear pain, congestion, sore throat, rhinorrhea, sneezing, mouth sores, neck pain, neck stiffness and tinnitus.   Respiratory: Denies SOB, DOE, cough, chest tightness,  and wheezing.   Cardiovascular: Denies chest pain, palpitations and leg swelling.  Genitourinary: Denies dysuria, urgency, frequency, hematuria, flank pain and difficulty urinating.  Musculoskeletal: Denies myalgias, back pain, joint swelling, arthralgias and gait problem.  Skin: No rash.  Neurological: Denies dizziness, seizures, syncope, weakness, light-headedness, numbness and headaches.  Hematological: Denies adenopathy. Easy bruising, personal or family bleeding history  Psychiatric/Behavioral: has anxiety or depression     Physical Exam:    BP 119/76   Pulse 99   Ht 5' 4"$  (1.626 m)   Wt (!) 310 lb (140.6 kg)   BMI 53.21 kg/m  Wt Readings from Last 3 Encounters:  04/16/22 (!) 310 lb (140.6 kg)  03/05/22 (!) 309 lb 3.2 oz (140.3 kg)  01/28/22 (!) 302 lb 3.2 oz (137.1 kg)   Constitutional:  Well-developed, in no acute distress. Psychiatric: Normal mood and affect. Behavior is normal. HEENT: Pupils normal.  Conjunctivae are normal. No scleral  icterus. Cardiovascular: Normal rate, regular rhythm. No edema Pulmonary/chest: Effort normal and breath sounds normal. No wheezing, rales or rhonchi. Abdominal: Soft, nondistended. Nontender. Bowel sounds active throughout. There are no masses palpable. No hepatomegaly. Rectal: Deferred Neurological: Alert and oriented to person place and time. Skin: Skin is warm and dry. No rashes noted.  Data Reviewed: I have personally reviewed following labs and imaging studies  CBC:    Latest Ref Rng & Units 03/05/2022    4:08 PM 07/23/2021    9:24 AM 10/19/2019    2:55 PM  CBC  WBC 4.0 - 10.5 K/uL 11.0  10.3  6.2   Hemoglobin 12.0 - 15.0 g/dL 13.6  14.1  14.9   Hematocrit 36.0 - 46.0 % 42.2  44.5  46.4   Platelets 150.0 - 400.0 K/uL 303.0  280  241     CMP:    Latest Ref Rng & Units 03/05/2022    4:08 PM 07/23/2021    9:24 AM 02/06/2021   10:26 AM  CMP  Glucose 70 - 99 mg/dL 95  52  85   BUN 6 - 23 mg/dL 10  11  11   $ Creatinine 0.40 - 1.20 mg/dL 0.73  0.85  0.77   Sodium 135 - 145 mEq/L 141  141  140   Potassium 3.5 - 5.1 mEq/L 4.2  4.5  4.4   Chloride 96 - 112 mEq/L 104  108  105   CO2 19 - 32 mEq/L 25  24  27   $ Calcium 8.4 - 10.5 mg/dL 9.7  9.4  9.4   Total Protein 6.0 - 8.3 g/dL 8.0  8.2  7.4   Total Bilirubin 0.2 - 1.2 mg/dL 0.7  1.0  0.7   Alkaline Phos 39 - 117 U/L 76  72  79   AST 0 - 37 U/L 17  21  16   $ ALT 0 - 35 U/L 11  12  12        $ Carmell Austria, MD 04/16/2022, 2:14 PM  Cc: Allwardt, Randa Evens, PA-C

## 2022-04-16 NOTE — Patient Instructions (Addendum)
_______________________________________________________  If your blood pressure at your visit was 140/90 or greater, please contact your primary care physician to follow up on this.  _______________________________________________________  If you are age 47 or older, your body mass index should be between 23-30. Your Body mass index is 53.21 kg/m. If this is out of the aforementioned range listed, please consider follow up with your Primary Care Provider.  If you are age 76 or younger, your body mass index should be between 19-25. Your Body mass index is 53.21 kg/m. If this is out of the aformentioned range listed, please consider follow up with your Primary Care Provider.   ________________________________________________________  The [facility] providers would like to encourage you to use [facility] to communicate with providers for non-urgent requests or questions.  Due to long hold times on the telephone, sending your provider a message by [facility] may be a faster and more efficient way to get a response.  Please allow 48 business hours for a response.  Please remember that this is for non-urgent requests.  _______________________________________________________  Dawn Galvan have been scheduled for a colonoscopy. Please follow written instructions given to you at your visit today.  Please pick up your prep supplies at the pharmacy within the next 1-3 days. If you use inhalers (even only as needed), please bring them with you on the day of your procedure.  Hold Phentermine 10 days prior to the procedure  Hold GLP-1 Agonists or Non-Insulin/weight loss injectable medications for 7 days prior to the procedure: Tanzeum, Trulicity, Byetta, Victoza, Bydureon, SymlinPen, Ozempic, Adlyxin, [place], Monjouro, Saxenda, Rybelsus   Hold metformin 1 day prior to the procedure   Please call with any questions or concerns.  Thank you,  Dr. Jackquline Denmark

## 2022-04-20 ENCOUNTER — Encounter: Payer: Self-pay | Admitting: Hematology and Oncology

## 2022-04-21 ENCOUNTER — Other Ambulatory Visit (HOSPITAL_COMMUNITY)
Admission: RE | Admit: 2022-04-21 | Discharge: 2022-04-21 | Disposition: A | Discharge status: Home / Self Care | Payer: Medicaid Other | Source: Ambulatory Visit | Attending: Obstetrics and Gynecology | Admitting: Obstetrics and Gynecology

## 2022-04-21 ENCOUNTER — Encounter: Payer: Self-pay | Admitting: Obstetrics and Gynecology

## 2022-04-21 ENCOUNTER — Ambulatory Visit: Payer: Medicaid Other | Admitting: Obstetrics and Gynecology

## 2022-04-21 ENCOUNTER — Encounter: Payer: Self-pay | Admitting: *Deleted

## 2022-04-21 DIAGNOSIS — N898 Other specified noninflammatory disorders of vagina: Secondary | ICD-10-CM | POA: Insufficient documentation

## 2022-04-21 DIAGNOSIS — Z01419 Encounter for gynecological examination (general) (routine) without abnormal findings: Secondary | ICD-10-CM | POA: Diagnosis not present

## 2022-04-21 DIAGNOSIS — Z124 Encounter for screening for malignant neoplasm of cervix: Secondary | ICD-10-CM

## 2022-04-21 DIAGNOSIS — Z3046 Encounter for surveillance of implantable subdermal contraceptive: Secondary | ICD-10-CM | POA: Diagnosis not present

## 2022-04-21 MED ORDER — ETONOGESTREL 68 MG ~~LOC~~ IMPL
68.0000 mg | DRUG_IMPLANT | Freq: Once | SUBCUTANEOUS | Status: AC
  Administered 2022-04-21: 68 mg via SUBCUTANEOUS

## 2022-04-21 NOTE — Progress Notes (Signed)
GYNECOLOGY VISIT  Patient name: Dawn Galvan MRN [id number]  Date of birth: [birth year] Chief Complaint:   Nexplanon Removal and Reinsertion  History:  Dawn Galvan is a 47 y.o. [phone] being seen today for nexplanon removal and reinsertion. Has had nexplanon x3 years and no issues and has had prior removal and reinsertion. Not sure which arm the nexplanon is in. Also due for a pap.    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   Morbid obesity with BMI of 45.0-49.9, adult ([place]) 02/07/2014   Obesity    Osteoarthritis    Plantar fasciitis    Pneumonia due to COVID-19 virus 10/16/2019   Formatting of this note might be different from the original. Last Assessment & Plan:  Formatting of this note might be different from the original. Patient seen via telehealth visit for COVID-19 infection. Comorbid obesity and pre-DM. Reports symptoms for the past week, and positive test 3 days ago. Complains of persistent fever with Tmax of 100.7, chills, mild SOB, body aches which are improving   PONV (postoperative nausea and vomiting)    Rash 08/10/2013   right thigh   Rash of neck 05/06/2020   Sleep apnea    does not wear cpap   Sore in mouth 05/06/2020   Sore throat 05/22/2019   Tenosynovitis     Past Surgical History:  Procedure Laterality Date   CARPAL TUNNEL RELEASE Left 08/18/2013   Procedure: LEFT CARPAL TUNNEL RELEASE;  Surgeon: Tennis Must, MD;  Location: [place];   Service: Orthopedics;  Laterality: Left;   CARPAL TUNNEL RELEASE Right 04/29/2017   Procedure: RIGHT CARPAL TUNNEL RELEASE;  Surgeon: Leanora Cover, MD;  Location: [place];  Service: Orthopedics;  Laterality: Right;   CESAREAN SECTION     CHOLECYSTECTOMY     COLONOSCOPY  01/24/2007   Small internal hemorrhoids (most likey etiology of bright red blood per rectum). Otherwise normal colonoscopy to cecum   HERNIA REPAIR     x 2   HYSTEROPLASTY REPAIR OF UTERINE ANOMALY     HYSTEROSCOPY WITH D & C  01/13/2012   Procedure: DILATATION AND CURETTAGE /HYSTEROSCOPY;  Surgeon: Osborne Oman, MD;  Location: [facility];  Service: Gynecology;  Laterality: N/A;  Pap smear   LAPAROSCOPIC GASTRIC SLEEVE RESECTION  2016   TOE SURGERY Left    second toe. rod placed   [place] EXTRACTION  11/20/2010    The following portions of the patient's history were reviewed and updated as appropriate: allergies, current medications, past family history, past medical history, past social history, past surgical history and problem list.   Health Maintenance:   Last pap     Component Value Date/Time   DIAGPAP  05/29/2019 1610    - Negative for Intraepithelial Lesions or Malignancy (NILM)   DIAGPAP - Benign reactive/reparative changes 05/29/2019 1610   [place] Negative 05/29/2019 1610  ADEQPAP  05/29/2019 1610    Satisfactory for evaluation; transformation zone component PRESENT.    Last mammogram: 08/2021 [facility] 1   Review of Systems:  Pertinent items are noted in HPI. Comprehensive review of systems was otherwise negative.   Objective:  Physical Exam There were no vitals taken for this visit.   Physical Exam Vitals and nursing note reviewed. Exam conducted with a chaperone present.  Constitutional:      Appearance: Normal appearance.  HENT:     Head: Normocephalic and atraumatic.  Pulmonary:     Effort: Pulmonary effort is normal.     Breath sounds: Normal breath sounds.  Genitourinary:     General: Normal vulva.     Exam position: Lithotomy position.     Vagina: Normal.     Cervix: Normal.  Musculoskeletal:     Comments: Nexplanon palpated in right upper arm   Skin:    General: Skin is warm and dry.  Neurological:     General: No focal deficit present.     Mental Status: She is alert.  Psychiatric:        Mood and Affect: Mood normal.        Behavior: Behavior normal.        Thought Content: Thought content normal.        Judgment: Judgment normal.     Nexplanon Removal and Reinsertion Patient identified, informed consent performed, consent signed.   Patient does understand that irregular bleeding is a very common side effect of this medication. She was advised to have backup contraception for one week after replacement of the implant. Pregnancy test in clinic today was negative.  Appropriate time out taken. Nexplanon site identified in right arm.  Area prepped in usual sterile fashon. 3 ml of 1% lidocaine was used to anesthetize the area at the distal end of the implant. A small stab incision was made right beside the implant on the distal portion. The Nexplanon rod was grasped using hemostats and removed without difficulty. There was minimal blood loss. There were no complications. Area was then injected with 1.5 ml of 1 % lidocaine. She was re-prepped with betadine, Nexplanon removed from packaging, Device confirmed in needle, then inserted full length of needle and withdrawn per handbook instructions. Nexplanon was able to palpated in the patient's arm; patient palpated the insert herself.  There was minimal blood loss. Patient insertion site covered with gauze and a pressure bandage to reduce any bruising. The patient tolerated the procedure well and was given post procedure instructions.       Assessment & Plan:  1. Encounter for removal and reinsertion of Nexplanon Now s/p removal and reinsertion of nexplanon device in right upper arm.  - etonogestrel (NEXPLANON)  implant 68 mg  2. Screening for cervical cancer Routine pap completed - Cytology - PAP  3. Vaginal odor Vaginitis collected today. Discussed treatment if warranted, can also do OTC vaginal borid acid prn.  - Cervicovaginal ancillary only( [place])  Routine preventative health maintenance measures emphasized.  Darliss Cheney, MD [facility]

## 2022-04-22 ENCOUNTER — Other Ambulatory Visit (HOSPITAL_BASED_OUTPATIENT_CLINIC_OR_DEPARTMENT_OTHER): Payer: Self-pay | Admitting: Obstetrics and Gynecology

## 2022-04-22 ENCOUNTER — Encounter: Payer: Self-pay | Admitting: *Deleted

## 2022-04-22 ENCOUNTER — Encounter: Payer: Self-pay | Admitting: Hematology and Oncology

## 2022-04-22 DIAGNOSIS — B9689 Other specified bacterial agents as the cause of diseases classified elsewhere: Secondary | ICD-10-CM

## 2022-04-22 LAB — CERVICOVAGINAL ANCILLARY ONLY
Bacterial Vaginitis (gardnerella): POSITIVE — AB
Candida Glabrata: NEGATIVE
Candida Vaginitis: NEGATIVE
Chlamydia: NEGATIVE
Comment: NEGATIVE
Comment: NEGATIVE
Comment: NEGATIVE
Comment: NEGATIVE
Comment: NEGATIVE
Comment: NORMAL
Neisseria Gonorrhea: NEGATIVE
Trichomonas: NEGATIVE

## 2022-04-22 MED ORDER — METRONIDAZOLE 500 MG PO TABS: 500.0000 mg | ORAL_TABLET | Freq: Two times a day (BID) | ORAL | 0 refills | Status: AC

## 2022-04-23 ENCOUNTER — Other Ambulatory Visit: Payer: Self-pay | Admitting: Physician Assistant

## 2022-04-23 DIAGNOSIS — E88819 Insulin resistance, unspecified: Secondary | ICD-10-CM

## 2022-04-23 DIAGNOSIS — R739 Hyperglycemia, unspecified: Secondary | ICD-10-CM

## 2022-04-25 ENCOUNTER — Other Ambulatory Visit: Payer: Medicaid Other

## 2022-04-26 ENCOUNTER — Encounter: Payer: Self-pay | Admitting: Obstetrics and Gynecology

## 2022-04-27 LAB — CYTOLOGY - PAP
Adequacy: ABSENT
Comment: NEGATIVE
Diagnosis: NEGATIVE
High risk HPV: NEGATIVE

## 2022-04-28 ENCOUNTER — Other Ambulatory Visit: Payer: Self-pay | Admitting: Lactation Services

## 2022-04-28 MED ORDER — METRONIDAZOLE 0.75 % VA GEL: 1.0000 | Freq: Every day | VAGINAL | 0 refills | Status: DC

## 2022-04-28 NOTE — Progress Notes (Signed)
RX for BV treatment changed to Metrogel as patient cannot tolerate oral Flagyl. Patient informed it may not be covered by her insurance.

## 2022-05-04 ENCOUNTER — Encounter: Payer: Medicaid Other | Admitting: Gastroenterology

## 2022-05-06 DIAGNOSIS — F419 Anxiety disorder, unspecified: Secondary | ICD-10-CM | POA: Diagnosis not present

## 2022-05-06 DIAGNOSIS — Z6841 Body Mass Index (BMI) 40.0 and over, adult: Secondary | ICD-10-CM | POA: Diagnosis not present

## 2022-05-06 DIAGNOSIS — Z9151 Personal history of suicidal behavior: Secondary | ICD-10-CM | POA: Diagnosis not present

## 2022-05-06 DIAGNOSIS — N951 Menopausal and female climacteric states: Secondary | ICD-10-CM | POA: Diagnosis not present

## 2022-05-06 DIAGNOSIS — E88819 Insulin resistance, unspecified: Secondary | ICD-10-CM | POA: Diagnosis not present

## 2022-05-13 ENCOUNTER — Encounter: Payer: Self-pay | Admitting: Physician Assistant

## 2022-05-13 DIAGNOSIS — F3340 Major depressive disorder, recurrent, in remission, unspecified: Secondary | ICD-10-CM | POA: Diagnosis not present

## 2022-05-13 DIAGNOSIS — R5382 Chronic fatigue, unspecified: Secondary | ICD-10-CM | POA: Diagnosis not present

## 2022-05-13 DIAGNOSIS — Z1339 Encounter for screening examination for other mental health and behavioral disorders: Secondary | ICD-10-CM | POA: Diagnosis not present

## 2022-05-13 DIAGNOSIS — Z6841 Body Mass Index (BMI) 40.0 and over, adult: Secondary | ICD-10-CM | POA: Diagnosis not present

## 2022-05-13 DIAGNOSIS — M2550 Pain in unspecified joint: Secondary | ICD-10-CM | POA: Diagnosis not present

## 2022-05-13 DIAGNOSIS — N951 Menopausal and female climacteric states: Secondary | ICD-10-CM | POA: Diagnosis not present

## 2022-05-13 DIAGNOSIS — G479 Sleep disorder, unspecified: Secondary | ICD-10-CM | POA: Diagnosis not present

## 2022-05-13 DIAGNOSIS — R454 Irritability and anger: Secondary | ICD-10-CM | POA: Diagnosis not present

## 2022-05-13 DIAGNOSIS — E88819 Insulin resistance, unspecified: Secondary | ICD-10-CM | POA: Diagnosis not present

## 2022-05-13 DIAGNOSIS — R232 Flushing: Secondary | ICD-10-CM | POA: Diagnosis not present

## 2022-05-13 DIAGNOSIS — Z1331 Encounter for screening for depression: Secondary | ICD-10-CM | POA: Diagnosis not present

## 2022-05-14 ENCOUNTER — Other Ambulatory Visit: Payer: Self-pay

## 2022-05-14 MED ORDER — OMEPRAZOLE 20 MG PO CPDR: 20.0000 mg | DELAYED_RELEASE_CAPSULE | Freq: Every day | ORAL | 1 refills | Status: DC

## 2022-05-20 ENCOUNTER — Inpatient Hospital Stay: Payer: Medicaid Other | Attending: Hematology and Oncology

## 2022-05-20 ENCOUNTER — Other Ambulatory Visit: Payer: Self-pay

## 2022-05-20 ENCOUNTER — Other Ambulatory Visit (HOSPITAL_COMMUNITY): Payer: Self-pay

## 2022-05-20 ENCOUNTER — Other Ambulatory Visit: Payer: Medicaid Other

## 2022-05-20 ENCOUNTER — Encounter: Payer: Self-pay | Admitting: Hematology and Oncology

## 2022-05-20 ENCOUNTER — Ambulatory Visit: Payer: Medicaid Other

## 2022-05-20 MED ORDER — LORAZEPAM 1 MG PO TABS
1.0000 mg | ORAL_TABLET | ORAL | 0 refills | Status: DC
  Filled 2022-05-20: qty 2, 1d supply, fill #0

## 2022-05-20 NOTE — Progress Notes (Signed)
Pt here for IV start for Scan to be done today @ ? [facility].  Three unsuccessful IV sticks by myself & Georgette Shell RN.  IV team called but pt called radiology center & was told that her appt was @ 2:30 & she would have to r/s.  She was very upset but realized that she had made a mistake in the time.  She thought her appt was @ 3:30 pm.  IV team did not start IV.  Message to Dr Iruku/Pod RN

## 2022-05-21 DIAGNOSIS — R5382 Chronic fatigue, unspecified: Secondary | ICD-10-CM | POA: Diagnosis not present

## 2022-05-21 DIAGNOSIS — Z6841 Body Mass Index (BMI) 40.0 and over, adult: Secondary | ICD-10-CM | POA: Diagnosis not present

## 2022-06-03 DIAGNOSIS — E88819 Insulin resistance, unspecified: Secondary | ICD-10-CM | POA: Diagnosis not present

## 2022-06-03 DIAGNOSIS — Z6841 Body Mass Index (BMI) 40.0 and over, adult: Secondary | ICD-10-CM | POA: Diagnosis not present

## 2022-06-10 DIAGNOSIS — Z6841 Body Mass Index (BMI) 40.0 and over, adult: Secondary | ICD-10-CM | POA: Diagnosis not present

## 2022-06-10 DIAGNOSIS — E88819 Insulin resistance, unspecified: Secondary | ICD-10-CM | POA: Diagnosis not present

## 2022-06-16 DIAGNOSIS — Z6841 Body Mass Index (BMI) 40.0 and over, adult: Secondary | ICD-10-CM | POA: Diagnosis not present

## 2022-06-16 DIAGNOSIS — K219 Gastro-esophageal reflux disease without esophagitis: Secondary | ICD-10-CM | POA: Diagnosis not present

## 2022-06-18 ENCOUNTER — Encounter (HOSPITAL_COMMUNITY): Payer: Self-pay | Admitting: Gastroenterology

## 2022-06-18 NOTE — Progress Notes (Signed)
Attempted to obtain medical history via telephone, unable to reach at this time. HIPAA compliant voicemail message left requesting return call to pre surgical testing department. 

## 2022-06-19 ENCOUNTER — Telehealth: Payer: Self-pay

## 2022-06-19 DIAGNOSIS — Z1211 Encounter for screening for malignant neoplasm of colon: Secondary | ICD-10-CM

## 2022-06-19 NOTE — Telephone Encounter (Signed)
Patient said she will be going out to town May 2nd for her daughter's graduation and wanting her procedure canceled and she couldn't make a previsit appointment because was at work and want me to call her on Monday. Told her procedure new date is 10-01-2022 and ill call her Monday

## 2022-06-22 NOTE — Telephone Encounter (Signed)
LVM to call back  Patient needs to make a previsit appt with nurse before her procedure date 10-01-2022. Can be done by telephone

## 2022-06-23 DIAGNOSIS — E88819 Insulin resistance, unspecified: Secondary | ICD-10-CM | POA: Diagnosis not present

## 2022-06-23 DIAGNOSIS — Z6841 Body Mass Index (BMI) 40.0 and over, adult: Secondary | ICD-10-CM | POA: Diagnosis not present

## 2022-06-24 ENCOUNTER — Other Ambulatory Visit: Payer: Self-pay | Admitting: Physician Assistant

## 2022-06-24 DIAGNOSIS — R739 Hyperglycemia, unspecified: Secondary | ICD-10-CM

## 2022-06-24 DIAGNOSIS — E88819 Insulin resistance, unspecified: Secondary | ICD-10-CM

## 2022-07-01 ENCOUNTER — Other Ambulatory Visit: Payer: Self-pay | Admitting: Physician Assistant

## 2022-07-01 ENCOUNTER — Encounter: Payer: Self-pay | Admitting: Physician Assistant

## 2022-07-01 DIAGNOSIS — F332 Major depressive disorder, recurrent severe without psychotic features: Secondary | ICD-10-CM

## 2022-07-01 DIAGNOSIS — F411 Generalized anxiety disorder: Secondary | ICD-10-CM

## 2022-07-02 NOTE — Telephone Encounter (Signed)
Please see patient message and advise.

## 2022-07-03 DIAGNOSIS — R5382 Chronic fatigue, unspecified: Secondary | ICD-10-CM | POA: Diagnosis not present

## 2022-07-03 DIAGNOSIS — Z6841 Body Mass Index (BMI) 40.0 and over, adult: Secondary | ICD-10-CM | POA: Diagnosis not present

## 2022-07-03 NOTE — Telephone Encounter (Signed)
Showing another provider listed as PCP.  Office management looking into this.

## 2022-07-07 ENCOUNTER — Encounter: Payer: Self-pay | Admitting: Physician Assistant

## 2022-07-07 ENCOUNTER — Other Ambulatory Visit: Payer: Self-pay | Admitting: Physician Assistant

## 2022-07-07 DIAGNOSIS — M25512 Pain in left shoulder: Secondary | ICD-10-CM

## 2022-07-07 DIAGNOSIS — F332 Major depressive disorder, recurrent severe without psychotic features: Secondary | ICD-10-CM

## 2022-07-07 DIAGNOSIS — F411 Generalized anxiety disorder: Secondary | ICD-10-CM

## 2022-07-07 MED ORDER — DICLOFENAC SODIUM 1 % EX GEL: 2.0000 g | Freq: Four times a day (QID) | CUTANEOUS | 1 refills | Status: DC

## 2022-07-07 MED ORDER — ESCITALOPRAM OXALATE 10 MG PO TABS: 10.0000 mg | ORAL_TABLET | Freq: Every day | ORAL | 1 refills | Status: DC

## 2022-07-07 NOTE — Telephone Encounter (Signed)
Please see pt message and advise patient

## 2022-07-07 NOTE — Telephone Encounter (Signed)
LVM for patient to call back to schedule a previsit

## 2022-07-09 DIAGNOSIS — R5382 Chronic fatigue, unspecified: Secondary | ICD-10-CM | POA: Diagnosis not present

## 2022-07-09 DIAGNOSIS — N951 Menopausal and female climacteric states: Secondary | ICD-10-CM | POA: Diagnosis not present

## 2022-07-09 DIAGNOSIS — Z6841 Body Mass Index (BMI) 40.0 and over, adult: Secondary | ICD-10-CM | POA: Diagnosis not present

## 2022-07-09 DIAGNOSIS — E88819 Insulin resistance, unspecified: Secondary | ICD-10-CM | POA: Diagnosis not present

## 2022-07-13 ENCOUNTER — Encounter: Payer: Self-pay | Admitting: Adult Health

## 2022-07-13 ENCOUNTER — Ambulatory Visit: Payer: Medicaid Other | Admitting: Adult Health

## 2022-07-13 VITALS — BP 120/86 | HR 103 | Temp 98.3°F | Ht 64.5 in | Wt 309.6 lb

## 2022-07-13 DIAGNOSIS — J309 Allergic rhinitis, unspecified: Secondary | ICD-10-CM

## 2022-07-13 DIAGNOSIS — J4530 Mild persistent asthma, uncomplicated: Secondary | ICD-10-CM | POA: Diagnosis not present

## 2022-07-13 DIAGNOSIS — G4733 Obstructive sleep apnea (adult) (pediatric): Secondary | ICD-10-CM

## 2022-07-13 MED ORDER — FLUTICASONE-SALMETEROL 250-50 MCG/ACT IN AEPB: INHALATION_SPRAY | RESPIRATORY_TRACT | 4 refills | Status: DC

## 2022-07-13 NOTE — Progress Notes (Signed)
@Patient  ID: Dawn Galvan, female    DOB: 08/12/1975, 46 y.o.   MRN: [id number]  Chief Complaint  Patient presents with   Follow-up    Referring provider: Rachel Moulds, MD  HPI: 47 year old female minimum smoking history seen for initial pulmonary consult October 2021 for post-COVID shortness of breath felt to have underlying asthma COVID-19 infection in August 2021 with COVID-pneumonia requiring hospitalization treated with remdesivir, Decadron and Baricitinib Medical history significant for obesity status post bariatric surgery, prediabetes, sleep apnea CPAP intolerant  TEST/EVENTS :  High-resolution CT chest December 25, 2019 showed minimal subpleural groundglass in the lower lobes, tiny pulmonary nodules maximum 3 mm.  Chest x-ray August 05, 2021 showed clear lungs   PFTs January 31, 2020-FEV1 83%, ratio 86, FVC 79%, no significant bronchodilator response, DLCO 83%  FENO today in office is 11 ppb .   07/13/2022 Follow up : Asthma  Patient presents for a 1 year follow-up.  Patient was treated previously in August 2021 for COVID-19 infection complicated by COVID-pneumonia requiring hospitalization.  Patient had lingering shortness of breath after COVID infection.  Follow-up chest x-ray showed clear lungs.  Previous PFTs showed minimal restriction.  Patient was treated for possible asthma with Wixela with significant improvement in symptoms.  Says overall since last visit she is doing well.  She has run out of her Wixela inhaler and needs refills.  She does continue to have some postnasal drainage despite taking Zyrtec and Flonase daily.  Says that cold air can make her symptoms sometimes worse.  But overall feels that she has been doing well.  Denies any hemoptysis, chest pain, orthopnea, increased edema.  Previously diagnosed with sleep apnea , tried CPAP in past. Says she can not wear anything on face. Declines further workup .    Allergies  Allergen Reactions   Doxycycline  Nausea And Vomiting    Dizziness    Other Itching and Other (See Comments)    Seasonal allergies- Itchy eyes, runny nose, etc..    Immunization History  Administered Date(s) Administered   PFIZER(Purple Top)SARS-COV-2 Vaccination 01/28/2020, 02/18/2020   Tdap 03/16/2019    Past Medical History:  Diagnosis Date   Abnormal chest x-ray 03/24/2017   Acute hypoxemic respiratory failure (HCC) 10/19/2019   Carpal tunnel syndrome    right   Carpal tunnel syndrome of left wrist 07/2013   COVID-19 10/16/2019   DJD (degenerative joint disease)    Dysmenorrhea    Epistaxis 05/06/2020   Family history of breast cancer    PALB2 Mutation in Mother   Family history of prostate cancer    GERD (gastroesophageal reflux disease)    Headache(784.0)    migraines or sinus   History of vertigo    states comes and goes; no current med.   Morbid obesity with BMI of 45.0-49.9, adult (HCC) 02/07/2014   Obesity    Osteoarthritis    Plantar fasciitis    Pneumonia due to COVID-19 virus 10/16/2019   Formatting of this note might be different from the original. Last Assessment & Plan:  Formatting of this note might be different from the original. Patient seen via telehealth visit for COVID-19 infection. Comorbid obesity and pre-DM. Reports symptoms for the past week, and positive test 3 days ago. Complains of persistent fever with Tmax of 100.7, chills, mild SOB, body aches which are improving   PONV (postoperative nausea and vomiting)    Rash 08/10/2013   right thigh   Rash of neck 05/06/2020   Sleep  apnea    does not wear cpap   Sore in mouth 05/06/2020   Sore throat 05/22/2019   Tenosynovitis     Tobacco History: Social History   Tobacco Use  Smoking Status Former   Years: 0   Types: Cigarettes   Quit date: 02/22/2009   Years since quitting: 13.3  Smokeless Tobacco Never   Counseling given: Not Answered   Outpatient Medications Prior to Visit  Medication Sig Dispense Refill    acetaminophen (TYLENOL 8 HOUR) 650 MG CR tablet Take 1 tablet (650 mg total) by mouth every 8 (eight) hours as needed for pain. 30 tablet 0   diclofenac Sodium (VOLTAREN) 1 % GEL Apply 2 grams to affected area 4 (four) times daily. 700 g 1   escitalopram (LEXAPRO) 10 MG tablet Take 1 tablet (10 mg total) by mouth daily. 30 tablet 1   etonogestrel (NEXPLANON) 68 MG IMPL implant 1 each by Subdermal route once.     fluticasone (FLONASE) 50 MCG/ACT nasal spray INSTILL 1 SPRAY INTO BOTH NOSTRILS DAILY 48 mL 3   metFORMIN (GLUCOPHAGE-XR) 500 MG 24 hr tablet Take 500 mg by mouth daily.     omeprazole (PRILOSEC) 20 MG capsule Take 1 capsule (20 mg total) by mouth daily. 90 capsule 1   phentermine (ADIPEX-P) 37.5 MG tablet Take 18.75 mg by mouth every morning.     fluticasone-salmeterol (WIXELA INHUB) 250-50 MCG/ACT AEPB INHALE 1 PUFF INTO THE LUNGS TWICE A DAY 180 each 3   ACCU-CHEK GUIDE test strip 1 EACH BY IN VITRO ROUTE IN THE MORNING, AT NOON, AND AT BEDTIME. MAY SUBSTITUTE TO ANY MANUFACTURER COVERED BY PATIENT'S INSURANCE. (Patient not taking: Reported on 07/13/2022) 100 strip 0   albuterol (VENTOLIN HFA) 108 (90 Base) MCG/ACT inhaler Inhale 1-2 puffs into the lungs every 6 (six) hours as needed. (Patient not taking: Reported on 04/21/2022) 8 g 2   Blood Glucose Monitoring Suppl (ACCU-CHEK GUIDE ME) w/Device KIT USE AS DIRECTED TO TEST BLOOD GLUCOSE (Patient not taking: Reported on 07/13/2022) 1 kit 0   LORazepam (ATIVAN) 1 MG tablet Take 1 tablet (1 mg total) by mouth as needed for anxiety (before procedure). (Patient not taking: Reported on 07/13/2022) 12 tablet 0   LORazepam (ATIVAN) 1 MG tablet Take 1 tablet by mouth now.  And repeat before scan if needed. (Patient not taking: Reported on 07/13/2022) 2 tablet 0   diphenhydrAMINE (BENADRYL) 25 MG tablet Take 25 mg by mouth at bedtime as needed for allergies or sleep. (Patient not taking: Reported on 07/13/2022)     metroNIDAZOLE (METROGEL) 0.75 % vaginal  gel Place 1 Applicatorful vaginally at bedtime. (Patient not taking: Reported on 07/13/2022) 70 g 0   rosuvastatin (CRESTOR) 10 MG tablet Take 1 tablet (10 mg total) by mouth daily. (Patient not taking: Reported on 07/13/2022) 90 tablet 1   No facility-administered medications prior to visit.     Review of Systems:   Constitutional:   No  weight loss, night sweats,  Fevers, chills, fatigue, or  lassitude.  HEENT:   No headaches,  Difficulty swallowing,  Tooth/dental problems, or  Sore throat,                No sneezing, itching, ear ache, + nasal congestion, post nasal drip,   CV:  No chest pain,  Orthopnea, PND, swelling in lower extremities, anasarca, dizziness, palpitations, syncope.   GI  No heartburn, indigestion, abdominal pain, nausea, vomiting, diarrhea, change in bowel habits, loss of appetite, bloody  stools.   Resp:  No chest wall deformity  Skin: no rash or lesions.  GU: no dysuria, change in color of urine, no urgency or frequency.  No flank pain, no hematuria   MS:  No joint pain or swelling.  No decreased range of motion.  No back pain.    Physical Exam  BP 120/86 (BP Location: Left Arm, Patient Position: Sitting, Cuff Size: Normal)   Pulse (!) 103   Temp 98.3 F (36.8 C) (Oral)   Ht 5' 4.5" (1.638 m)   Wt (!) 309 lb 9.6 oz (140.4 kg)   SpO2 96%   BMI 52.32 kg/m   GEN: A/Ox3; pleasant , NAD, BMI 52     HEENT:  [place]/AT,  NOSE-clear, THROAT-clear, no lesions, no postnasal drip or exudate noted.  Class III MP airway  NECK:  Supple w/ fair ROM; no JVD; normal carotid impulses w/o bruits; no thyromegaly or nodules palpated; no lymphadenopathy.    RESP  Clear  P & A; w/o, wheezes/ rales/ or rhonchi. no accessory muscle use, no dullness to percussion  CARD:  RRR, no m/r/g, tr peripheral edema, pulses intact, no cyanosis or clubbing.  GI:   Soft & nt; nml bowel sounds; no organomegaly or masses detected.   Musco: Warm bil, no deformities or joint swelling noted.    Neuro: alert, no focal deficits noted.    Skin: Warm, no lesions or rashes    Lab Results:    BMET   BNP No results found for: "BNP"  ProBNP No results found for: "PROBNP"  Imaging: No results found.       Latest Ref Rng & Units 01/31/2020   12:04 PM  PFT Results  FVC-Pre L 2.43   FVC-Predicted Pre % 79   FVC-Post L 2.29   FVC-Predicted Post % 75   Pre FEV1/FVC % % 86   Post FEV1/FCV % % 87   FEV1-Pre L 2.08   FEV1-Predicted Pre % 83   FEV1-Post L 1.99   DLCO uncorrected ml/min/mmHg 18.00   DLCO UNC% % 83   DLCO corrected ml/min/mmHg 18.00   DLCO COR %Predicted % 83   DLVA Predicted % 125   TLC L 3.95   TLC % Predicted % 78   RV % Predicted % 99     No results found for: "NITRICOXIDE"      Assessment & Plan:   Asthma Mild persistent asthma appears well-controlled on Wixela.  Continue to control for triggers.  Plan  Patient Instructions  Continue on Wixela inhaler 1 puff twice daily, rinse after use Albuterol inhaler as needed for wheezing Continue on Zyrtec 10 mg daily in am .  Chlorpheniramine 4mg  At bedtime  As needed  drainage (Chlor tabs )  Continue on Flonase daily .  Follow up with Dr. Isaiah Serge in 1 year and As needed      Allergic rhinitis Mild flare of symptoms.  Add in chlor tabs 4 mg at bedtime as needed Continue on Zyrtec and Flonase  Plan  Patient Instructions  Continue on Wixela inhaler 1 puff twice daily, rinse after use Albuterol inhaler as needed for wheezing Continue on Zyrtec 10 mg daily in am .  Chlorpheniramine 4mg  At bedtime  As needed  drainage (Chlor tabs )  Continue on Flonase daily .  Follow up with Dr. Isaiah Serge in 1 year and As needed      Sleep apnea, obstructive History of sleep apnea.  Intolerant to CPAP.  We discussed further  evaluation and potential complications of untreated sleep apnea.  Patient declines further evaluation  Morbid obesity (HCC) Healthy weight loss discussed     Rubye Oaks,  NP 07/13/2022

## 2022-07-13 NOTE — Assessment & Plan Note (Signed)
History of sleep apnea.  Intolerant to CPAP.  We discussed further evaluation and potential complications of untreated sleep apnea.  Patient declines further evaluation

## 2022-07-13 NOTE — Telephone Encounter (Signed)
Called and LVM for patient to call back to schedule a previsit.

## 2022-07-13 NOTE — Telephone Encounter (Signed)
Called pt and advised last filled 04/29/22 for 90 day supply according to pharmacy, it is currently ready for pick up. Pt verbalized understanding

## 2022-07-13 NOTE — Assessment & Plan Note (Signed)
Healthy weight loss discussed 

## 2022-07-13 NOTE — Telephone Encounter (Signed)
Patient called to return phone call. Notified her we have to schedule a pre visit for her procedure. Did not want to at the time, hung up.

## 2022-07-13 NOTE — Assessment & Plan Note (Signed)
Mild persistent asthma appears well-controlled on Wixela.  Continue to control for triggers.  Plan  Patient Instructions  Continue on Wixela inhaler 1 puff twice daily, rinse after use Albuterol inhaler as needed for wheezing Continue on Zyrtec 10 mg daily in am .  Chlorpheniramine 4mg  At bedtime  As needed  drainage (Chlor tabs )  Continue on Flonase daily .  Follow up with Dr. Isaiah Serge in 1 year and As needed

## 2022-07-13 NOTE — Patient Instructions (Addendum)
Continue on Wixela inhaler 1 puff twice daily, rinse after use Albuterol inhaler as needed for wheezing Continue on Zyrtec 10 mg daily in am .  Chlorpheniramine 4mg  At bedtime  As needed  drainage (Chlor tabs )  Continue on Flonase daily .  Follow up with Dr. Isaiah Serge in 1 year and As needed

## 2022-07-13 NOTE — Assessment & Plan Note (Signed)
Mild flare of symptoms.  Add in chlor tabs 4 mg at bedtime as needed Continue on Zyrtec and Flonase  Plan  Patient Instructions  Continue on Wixela inhaler 1 puff twice daily, rinse after use Albuterol inhaler as needed for wheezing Continue on Zyrtec 10 mg daily in am .  Chlorpheniramine 4mg  At bedtime  As needed  drainage (Chlor tabs )  Continue on Flonase daily .  Follow up with Dr. Isaiah Serge in 1 year and As needed

## 2022-07-21 DIAGNOSIS — Z6841 Body Mass Index (BMI) 40.0 and over, adult: Secondary | ICD-10-CM | POA: Diagnosis not present

## 2022-07-21 DIAGNOSIS — K219 Gastro-esophageal reflux disease without esophagitis: Secondary | ICD-10-CM | POA: Diagnosis not present

## 2022-08-03 DIAGNOSIS — Z6841 Body Mass Index (BMI) 40.0 and over, adult: Secondary | ICD-10-CM | POA: Diagnosis not present

## 2022-08-03 DIAGNOSIS — K219 Gastro-esophageal reflux disease without esophagitis: Secondary | ICD-10-CM | POA: Diagnosis not present

## 2022-08-05 ENCOUNTER — Other Ambulatory Visit: Payer: Self-pay | Admitting: Physician Assistant

## 2022-08-05 DIAGNOSIS — F332 Major depressive disorder, recurrent severe without psychotic features: Secondary | ICD-10-CM

## 2022-08-05 DIAGNOSIS — F411 Generalized anxiety disorder: Secondary | ICD-10-CM

## 2022-08-10 NOTE — Telephone Encounter (Signed)
LVM for patient to call back to schedule a previsit for her upcoming procedure

## 2022-08-11 DIAGNOSIS — Z6841 Body Mass Index (BMI) 40.0 and over, adult: Secondary | ICD-10-CM | POA: Diagnosis not present

## 2022-08-11 DIAGNOSIS — E88819 Insulin resistance, unspecified: Secondary | ICD-10-CM | POA: Diagnosis not present

## 2022-08-11 DIAGNOSIS — K219 Gastro-esophageal reflux disease without esophagitis: Secondary | ICD-10-CM | POA: Diagnosis not present

## 2022-08-11 NOTE — Telephone Encounter (Signed)
Called patient and ranged busy twice called [phone]

## 2022-08-18 NOTE — Telephone Encounter (Signed)
LVM for patient to call back. ?

## 2022-08-19 ENCOUNTER — Telehealth: Payer: Self-pay | Admitting: Gastroenterology

## 2022-08-19 NOTE — Telephone Encounter (Addendum)
I sent mychart message to patient. She needs to respond by July 12 or her procedure will be cancelled  Amb referral and instructions sent

## 2022-08-19 NOTE — Telephone Encounter (Signed)
Inbound call from  patient regarding some missed call she had from our office.As soon as a answered the called , patient said to me that she only have 1 minute for me to talk , as I was going through her chart I explained patient she need a pre-visit appointment in order to have procedure done, then she asked if she would have to make another appointment to which I replied , its a pre-visit over the phone and patient just said " See this is why I don't  like you all , you guys take to long " and patient hangs up.

## 2022-08-19 NOTE — Telephone Encounter (Signed)
error 

## 2022-08-20 ENCOUNTER — Telehealth: Payer: Self-pay | Admitting: Hematology and Oncology

## 2022-08-20 NOTE — Telephone Encounter (Signed)
.  Left a message regarding appointment.

## 2022-08-21 NOTE — Addendum Note (Signed)
Addended by: Alberteen Sam E on: 08/21/2022 12:55 PM   Modules accepted: Orders

## 2022-08-26 DIAGNOSIS — Z6841 Body Mass Index (BMI) 40.0 and over, adult: Secondary | ICD-10-CM | POA: Diagnosis not present

## 2022-08-26 DIAGNOSIS — R5382 Chronic fatigue, unspecified: Secondary | ICD-10-CM | POA: Diagnosis not present

## 2022-08-26 DIAGNOSIS — E88819 Insulin resistance, unspecified: Secondary | ICD-10-CM | POA: Diagnosis not present

## 2022-09-08 NOTE — Telephone Encounter (Signed)
Patient procedure cancelled due to non-compliance

## 2022-09-09 DIAGNOSIS — K219 Gastro-esophageal reflux disease without esophagitis: Secondary | ICD-10-CM | POA: Diagnosis not present

## 2022-09-09 DIAGNOSIS — Z6841 Body Mass Index (BMI) 40.0 and over, adult: Secondary | ICD-10-CM | POA: Diagnosis not present

## 2022-09-14 ENCOUNTER — Encounter: Payer: Self-pay | Admitting: Obstetrics and Gynecology

## 2022-09-14 DIAGNOSIS — B9689 Other specified bacterial agents as the cause of diseases classified elsewhere: Secondary | ICD-10-CM

## 2022-09-14 DIAGNOSIS — R21 Rash and other nonspecific skin eruption: Secondary | ICD-10-CM | POA: Diagnosis not present

## 2022-09-14 DIAGNOSIS — B372 Candidiasis of skin and nail: Secondary | ICD-10-CM | POA: Diagnosis not present

## 2022-09-15 MED ORDER — METRONIDAZOLE 0.75 % VA GEL
1.0000 | Freq: Every day | VAGINAL | 0 refills | Status: AC
Start: 2022-09-15 — End: 2022-09-20

## 2022-09-23 DIAGNOSIS — K219 Gastro-esophageal reflux disease without esophagitis: Secondary | ICD-10-CM | POA: Diagnosis not present

## 2022-09-23 DIAGNOSIS — Z6841 Body Mass Index (BMI) 40.0 and over, adult: Secondary | ICD-10-CM | POA: Diagnosis not present

## 2022-10-01 ENCOUNTER — Ambulatory Visit (HOSPITAL_COMMUNITY): Admit: 2022-10-01 | Payer: Medicaid Other | Admitting: Gastroenterology

## 2022-10-01 ENCOUNTER — Ambulatory Visit: Payer: Medicaid Other | Admitting: Hematology and Oncology

## 2022-10-01 ENCOUNTER — Encounter (HOSPITAL_COMMUNITY): Payer: Self-pay

## 2022-10-01 SURGERY — COLONOSCOPY WITH PROPOFOL
Anesthesia: Monitor Anesthesia Care

## 2022-10-13 DIAGNOSIS — Z6841 Body Mass Index (BMI) 40.0 and over, adult: Secondary | ICD-10-CM | POA: Diagnosis not present

## 2022-10-13 DIAGNOSIS — E88819 Insulin resistance, unspecified: Secondary | ICD-10-CM | POA: Diagnosis not present

## 2022-10-15 ENCOUNTER — Ambulatory Visit (HOSPITAL_COMMUNITY): Payer: Medicaid Other | Admitting: Clinical

## 2022-10-15 ENCOUNTER — Encounter (HOSPITAL_COMMUNITY): Payer: Self-pay

## 2022-10-15 ENCOUNTER — Telehealth (HOSPITAL_COMMUNITY): Payer: Self-pay | Admitting: Clinical

## 2022-10-15 NOTE — Telephone Encounter (Signed)
Patient LVM today @ 7:15 a.m. stating she keeps getting reminders that she has a virtual appointment with Idalia Needle Cozart this morning & she needs to cancel & reschedule. I returned her call @ 7:49 a.m. Patient did not answer I LVM informing her that due to the time of cancellation this will be marked as a no show. Thanks

## 2022-10-27 ENCOUNTER — Inpatient Hospital Stay (HOSPITAL_BASED_OUTPATIENT_CLINIC_OR_DEPARTMENT_OTHER): Payer: Medicaid Other | Admitting: Hematology and Oncology

## 2022-10-27 VITALS — BP 140/81 | HR 85 | Temp 97.7°F | Resp 18 | Ht 64.5 in | Wt 313.1 lb

## 2022-10-27 DIAGNOSIS — Z1501 Genetic susceptibility to malignant neoplasm of breast: Secondary | ICD-10-CM

## 2022-10-27 DIAGNOSIS — Z1589 Genetic susceptibility to other disease: Secondary | ICD-10-CM

## 2022-10-27 DIAGNOSIS — Z1509 Genetic susceptibility to other malignant neoplasm: Secondary | ICD-10-CM

## 2022-10-28 NOTE — Progress Notes (Signed)
Pt left before I could see her, was going to reschedule.  Rachel Moulds MD

## 2022-11-10 DIAGNOSIS — Z6841 Body Mass Index (BMI) 40.0 and over, adult: Secondary | ICD-10-CM | POA: Diagnosis not present

## 2022-11-10 DIAGNOSIS — K219 Gastro-esophageal reflux disease without esophagitis: Secondary | ICD-10-CM | POA: Diagnosis not present

## 2022-11-25 DIAGNOSIS — K219 Gastro-esophageal reflux disease without esophagitis: Secondary | ICD-10-CM | POA: Diagnosis not present

## 2022-11-25 DIAGNOSIS — Z6841 Body Mass Index (BMI) 40.0 and over, adult: Secondary | ICD-10-CM | POA: Diagnosis not present

## 2022-11-25 DIAGNOSIS — N951 Menopausal and female climacteric states: Secondary | ICD-10-CM | POA: Diagnosis not present

## 2022-11-25 DIAGNOSIS — M2550 Pain in unspecified joint: Secondary | ICD-10-CM | POA: Diagnosis not present

## 2022-12-02 DIAGNOSIS — N951 Menopausal and female climacteric states: Secondary | ICD-10-CM | POA: Diagnosis not present

## 2022-12-02 DIAGNOSIS — R5382 Chronic fatigue, unspecified: Secondary | ICD-10-CM | POA: Diagnosis not present

## 2022-12-21 ENCOUNTER — Telehealth: Payer: Self-pay | Admitting: Adult Health

## 2022-12-21 ENCOUNTER — Encounter: Payer: Self-pay | Admitting: *Deleted

## 2022-12-21 ENCOUNTER — Encounter: Payer: Self-pay | Admitting: Hematology and Oncology

## 2022-12-21 MED ORDER — FLUTICASONE PROPIONATE 50 MCG/ACT NA SUSP: NASAL | 3 refills | Status: AC

## 2022-12-21 MED ORDER — FLUTICASONE-SALMETEROL 250-50 MCG/ACT IN AEPB: INHALATION_SPRAY | RESPIRATORY_TRACT | 3 refills | Status: AC

## 2022-12-21 NOTE — Telephone Encounter (Signed)
Rxs were sent to the pharmacy on file for her and I have sent her a mychart msg to let her know that this was done.

## 2022-12-31 ENCOUNTER — Other Ambulatory Visit: Payer: Self-pay | Admitting: Physician Assistant

## 2022-12-31 DIAGNOSIS — F411 Generalized anxiety disorder: Secondary | ICD-10-CM

## 2022-12-31 DIAGNOSIS — F332 Major depressive disorder, recurrent severe without psychotic features: Secondary | ICD-10-CM

## 2023-01-13 ENCOUNTER — Telehealth: Payer: Self-pay | Admitting: *Deleted

## 2023-01-13 ENCOUNTER — Inpatient Hospital Stay: Payer: Medicaid Other | Attending: Hematology and Oncology | Admitting: Hematology and Oncology

## 2023-01-13 ENCOUNTER — Other Ambulatory Visit (HOSPITAL_BASED_OUTPATIENT_CLINIC_OR_DEPARTMENT_OTHER): Payer: Self-pay | Admitting: Hematology and Oncology

## 2023-01-13 VITALS — BP 152/83 | HR 92 | Temp 98.3°F | Resp 16 | Wt 317.1 lb

## 2023-01-13 DIAGNOSIS — Z803 Family history of malignant neoplasm of breast: Secondary | ICD-10-CM | POA: Diagnosis not present

## 2023-01-13 DIAGNOSIS — Z79899 Other long term (current) drug therapy: Secondary | ICD-10-CM | POA: Diagnosis not present

## 2023-01-13 DIAGNOSIS — Z1501 Genetic susceptibility to malignant neoplasm of breast: Secondary | ICD-10-CM

## 2023-01-13 DIAGNOSIS — Z1509 Genetic susceptibility to other malignant neoplasm: Secondary | ICD-10-CM | POA: Diagnosis not present

## 2023-01-13 DIAGNOSIS — Z1589 Genetic susceptibility to other disease: Secondary | ICD-10-CM | POA: Insufficient documentation

## 2023-01-13 NOTE — Telephone Encounter (Signed)
This RN spoke with pt per need to schedule MRI of breast due to several caveats in proceeding.  She has to use antianxiety med that requires her to have transportation.  She is a difficult stick that requires her to come to this office to be accessed which can take up to an hour - and then [facility] would need [facility].  This RN sent an email to Lucia Gaskins at the [facility] to help coordinate the above to allow for time for IV to be placed.

## 2023-01-13 NOTE — Progress Notes (Signed)
[facility]  Telephone:(336) [phone] Fax:(336) [phone]    ID: Dawn Galvan DOB: 27-Nov-1975  MR#: [id number]  JYN#:[id number]  Patient Care Team: Allwardt, Dawn Infante, PA-C as PCP - General (Physician Assistant)   CHIEF COMPLAINT: PALB 2 Mutation, morbid obesity  CURRENT TREATMENT: intensified screening while definitive surgery pending  INTERVAL HISTORY:  Dawn Galvan returns today for follow up of her PALB2 mutation She is here, she did not wait for her last appointment.  Today she mentions to me that she has not done her mammogram.  She apparently came for her MRI but they had a very hard time getting an IV in her hence she left.  She has not rescheduled this.  She denies any other health changes today.  She has not felt anything in her breast.  She is worried about doing mammograms only for her breast cancer screening since she has large breasts and she has read somewhere that mammograms can miss breast cancers.  She would like to continue mammograms alternating with MRIs.  No change in her bowel habits.  No new skin lesions that she has noticed. Rest of the pertinent 10 point ROS reviewed and negative    COVID 19 VACCINATION STATUS: infection 09/2019; Pfizer x2 in 01/2020   HISTORY OF CURRENT ILLNESS: From the original intake note:  Dawn Galvan is a 47 y.o. 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 She was originally under the care of Dr. Pamelia Hoit, but the patient requested a second opinion. Dawn Galvan's mother, Dawn Galvan, was also diagnosed with breast cancer related to PALB 2 mutation.  Dawn Galvan opted for intensified screening but passed on antiestrogens. Because of this history, Dawn Galvan also underwent genetic testing and it returned positive for the PALB 2 mutation in July 2019.  She also decided against antiestrogens.   PAST MEDICAL HISTORY: Past Medical History:  Diagnosis Date   Abnormal chest x-ray 03/24/2017   Acute hypoxemic  respiratory failure (HCC) 10/19/2019   Carpal tunnel syndrome    right   Carpal tunnel syndrome of left wrist 07/2013   COVID-19 10/16/2019   DJD (degenerative joint disease)    Dysmenorrhea    Epistaxis 05/06/2020   Family history of breast cancer    PALB2 Mutation in Mother   Family history of prostate cancer    GERD (gastroesophageal reflux disease)    Headache(784.0)    migraines or sinus   History of vertigo    states comes and goes; no current med.   Morbid obesity with BMI of 45.0-49.9, adult (HCC) 02/07/2014   Obesity    Osteoarthritis    Plantar fasciitis    Pneumonia due to COVID-19 virus 10/16/2019   Formatting of this note might be different from the original. Last Assessment & Plan:  Formatting of this note might be different from the original. Patient seen via telehealth visit for COVID-19 infection. Comorbid obesity and pre-DM. Reports symptoms for the past week, and positive test 3 days ago. Complains of persistent fever with Tmax of 100.7, chills, mild SOB, body aches which are improving   PONV (postoperative nausea and vomiting)    Rash 08/10/2013   right thigh   Rash of neck 05/06/2020   Sleep apnea    does not wear cpap   Sore in mouth 05/06/2020   Sore throat 05/22/2019   Tenosynovitis     PAST SURGICAL HISTORY: Past Surgical History:  Procedure Laterality Date   CARPAL TUNNEL RELEASE Left 08/18/2013  Procedure: LEFT CARPAL TUNNEL RELEASE;  Surgeon: Tami Ribas, MD;  Location: [facility] OR;  Service: Orthopedics;  Laterality: Left;   CARPAL TUNNEL RELEASE Right 04/29/2017   Procedure: RIGHT CARPAL TUNNEL RELEASE;  Surgeon: Betha Loa, MD;  Location: [facility] OR;  Service: Orthopedics;  Laterality: Right;   CESAREAN SECTION     CHOLECYSTECTOMY     COLONOSCOPY  01/24/2007   Small internal hemorrhoids (most likey etiology of bright red blood per rectum). Otherwise normal colonoscopy to cecum   HERNIA REPAIR     x 2   HYSTEROPLASTY REPAIR OF UTERINE ANOMALY      HYSTEROSCOPY WITH D & C  01/13/2012   Procedure: DILATATION AND CURETTAGE /HYSTEROSCOPY;  Surgeon: Tereso Newcomer, MD;  Location: [facility] ORS;  Service: Gynecology;  Laterality: N/A;  Pap smear   LAPAROSCOPIC GASTRIC SLEEVE RESECTION  2016   TOE SURGERY Left    second toe. rod placed   WISDOM TOOTH EXTRACTION  11/20/2010    FAMILY HISTORY: Family History  Problem Relation Age of Onset   Hypertension Mother    Breast cancer Mother    Hypertension Galvan    Diabetes Maternal Grandmother    Diabetes Paternal Grandmother   As of August 2020 Dawn Galvan is in his 90's. Patients' mother is 71: She also carries a PALB2 mutation and has been diagnosed with breast cancer. The patient has 1 brother and 2 sisters, none with cancer. Two of her maternal aunts (3 out of her mother's 8 sisters) had breast cancer. Patient denies anyone in her family having ovarian or pancreatic cancer.    GYNECOLOGIC HISTORY:  No LMP recorded. Patient has had an implant. Menarche: 47 years old Age at first live birth: 47 years old GX P: 2 LMP: unknown, uses implant birth control Contraceptive: implanon Hysterectomy?: no BSO?: no   SOCIAL HISTORY:  [name] is a single. 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 She has two children: Dawn Galvan and Dawn Galvan. 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 Dawn Galvan is in 11th grade.  The patient does not belong to a church, synagogue, or mosque.    ADVANCED DIRECTIVES: Not in place    HEALTH MAINTENANCE: Social History   Tobacco Use   Smoking status: Former    Current packs/day: 0.00    Types: Cigarettes    Start date: 02/22/2009    Quit date: 02/22/2009    Years since quitting: 13.8   Smokeless tobacco: Never  Vaping Use   Vaping status: Never Used  Substance Use Topics   Alcohol use: Not Currently    Comment: Social   Drug use: No    Colonoscopy: Dr. Chales Abrahams in [place]  [facility]  Bone density: none [facility]  Allergies  Allergen Reactions   Doxycycline Nausea And Vomiting    Dizziness    Other Itching and Other (See Comments)    Seasonal allergies- Itchy eyes, runny nose, etc..    Current Outpatient Medications  Medication Sig Dispense Refill   ACCU-CHEK GUIDE test strip 1 EACH BY IN VITRO ROUTE IN THE MORNING, AT NOON, AND AT BEDTIME. MAY SUBSTITUTE TO ANY MANUFACTURER COVERED BY PATIENT'S INSURANCE. (Patient not taking: Reported on 07/13/2022) 100 strip 0   acetaminophen (TYLENOL 8 HOUR) 650 MG CR tablet Take 1 tablet (650 mg total) by mouth every 8 (eight) hours as needed for pain. 30 tablet 0   albuterol (VENTOLIN HFA) 108 (90 Base) MCG/ACT inhaler Inhale 1-2  puffs into the lungs every 6 (six) hours as needed. (Patient not taking: Reported on 04/21/2022) 8 g 2   Blood Glucose Monitoring Suppl (ACCU-CHEK GUIDE ME) w/Device KIT USE AS DIRECTED TO TEST BLOOD GLUCOSE (Patient not taking: Reported on 07/13/2022) 1 kit 0   diclofenac Sodium (VOLTAREN) 1 % GEL Apply 2 grams to affected area 4 (four) times daily. 700 g 1   escitalopram (LEXAPRO) 10 MG tablet TAKE 1 TABLET BY MOUTH EVERY DAY 90 tablet 0   etonogestrel (NEXPLANON) 68 MG IMPL implant 1 each by Subdermal route once.     fluticasone (FLONASE) 50 MCG/ACT nasal spray INSTILL 1 SPRAY INTO BOTH NOSTRILS DAILY 48 mL 3   fluticasone-salmeterol (WIXELA INHUB) 250-50 MCG/ACT AEPB INHALE 1 PUFF INTO THE LUNGS TWICE A DAY 180 each 3   LORazepam (ATIVAN) 1 MG tablet Take 1 tablet (1 mg total) by mouth as needed for anxiety (before procedure). (Patient not taking: Reported on 07/13/2022) 12 tablet 0   LORazepam (ATIVAN) 1 MG tablet Take 1 tablet by mouth now.  And repeat before scan if needed. (Patient not taking: Reported on 07/13/2022) 2 tablet 0   metFORMIN (GLUCOPHAGE-XR) 500 MG 24 hr tablet Take 500 mg by mouth daily.     omeprazole (PRILOSEC) 20 MG capsule Take 1 capsule (20 mg total) by mouth daily. 90 capsule 1   phentermine (ADIPEX-P)  37.5 MG tablet Take 18.75 mg by mouth every morning.     No current facility-administered medications for this visit.    OBJECTIVE: African American woman in no acute distress  There were no vitals filed for this visit.    There is no height or weight on file to calculate BMI.   Wt Readings from Last 3 Encounters:  10/27/22 (!) 313 lb 1.6 oz (142 kg)  07/13/22 (!) 309 lb 9.6 oz (140.4 kg)  04/16/22 (!) 310 lb (140.6 kg)     ECOG FS:1 - Symptomatic but completely ambulatory  Sclerae unicteric, EOMs intact Large pendulous breast.  No palpable masses or regional adenopathy. Bilateral lower extremities without any edema.   Abdomen soft, nontender, nondistended. Chest: Clear to auscultation bilaterally   LAB RESULTS:  CMP     Component Value Date/Time   NA 141 03/05/2022 1608   NA 143 02/28/2016 1117   K 4.2 03/05/2022 1608   CL 104 03/05/2022 1608   CO2 25 03/05/2022 1608   GLUCOSE 95 03/05/2022 1608   BUN 10 03/05/2022 1608   BUN 9 02/28/2016 1117   CREATININE 0.73 03/05/2022 1608   CALCIUM 9.7 03/05/2022 1608   PROT 8.0 03/05/2022 1608   ALBUMIN 4.2 03/05/2022 1608   AST 17 03/05/2022 1608   ALT 11 03/05/2022 1608   ALKPHOS 76 03/05/2022 1608   BILITOT 0.7 03/05/2022 1608   GFRNONAA >60 07/23/2021 0924   GFRAA >60 10/19/2019 1455   Lab Results  Component Value Date   WBC 11.0 (H) 03/05/2022   NEUTROABS 7.2 03/05/2022   HGB 13.6 03/05/2022   HCT 42.2 03/05/2022   MCV 90.4 03/05/2022   PLT 303.0 03/05/2022   No results found for: "LABCA2"  No components found for: "PIRJJO841"  No results for input(s): "INR" in the last 168 hours.  No results found for: "LABCA2"  No results found for: "YSA630"  No results found for: "CAN125"  No results found for: "CAN153"  No results found for: "CA2729"  No components found for: "HGQUANT"  No results found for: "CEA1", "CEA" / No results found  for: "CEA1", "CEA"   No results found for: "AFPTUMOR"  No results  found for: "CHROMOGRNA"  No results found for: "TOTALPROTELP", "ALBUMINELP", "A1GS", "A2GS", "BETS", "BETA2SER", "GAMS", "MSPIKE", "SPEI" (this displays SPEP labs)  No results found for: "KPAFRELGTCHN", "LAMBDASER", "KAPLAMBRATIO" (kappa/lambda light chains)  No results found for: "HGBA", "HGBA2QUANT", "HGBFQUANT", "HGBSQUAN" (Hemoglobinopathy evaluation)   Lab Results  Component Value Date   LDH 381 (H) 10/19/2019    No results found for: "Galvan", "TIBC", "IRONPCTSAT" (Galvan and TIBC)  Lab Results  Component Value Date   FERRITIN 1,190 (H) 10/19/2019    Urinalysis    Component Value Date/Time   COLORURINE YELLOW 11/14/2017 1244   APPEARANCEUR Clear 12/27/2019 1520   LABSPEC >=1.030 10/29/2019 1714   PHURINE 5.5 10/29/2019 1714   GLUCOSEU Negative 12/27/2019 1520   HGBUR NEGATIVE 10/29/2019 1714   BILIRUBINUR Negative 01/28/2022 1348   BILIRUBINUR Negative 12/27/2019 1520   KETONESUR TRACE (A) 10/29/2019 1714   PROTEINUR Positive (A) 01/28/2022 1348   PROTEINUR Negative 12/27/2019 1520   PROTEINUR NEGATIVE 10/29/2019 1714   UROBILINOGEN 0.2 01/28/2022 1348   UROBILINOGEN 1.0 10/29/2019 1714   NITRITE Negative 01/28/2022 1348   NITRITE Negative 12/27/2019 1520   NITRITE NEGATIVE 10/29/2019 1714   LEUKOCYTESUR Trace (A) 01/28/2022 1348   LEUKOCYTESUR Negative 12/27/2019 1520   LEUKOCYTESUR NEGATIVE 10/29/2019 1714    STUDIES:  No results found.   ELIGIBLE FOR AVAILABLE RESEARCH PROTOCOL: no   ASSESSMENT: 47 y.o. 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   (2) patient plans to undergo bilateral prophylactic mastectomies with reconstruction, delayed until BMI decreases  (3) intensified screening: Mammography in June, breast MRI November or December   PLAN:  Dawn Galvan is here  for intensified screening.  Since last visit, she denies any new health issues.  She is still working on losing weight.  S she is at this time overdue for mammogram.  We have ordered this today.  No concerns on physical exam.  She wants to continue mammograms alternating with MRIs for breast cancer screening since she is worried mammogram will not detect breast cancers in her case given the large and pendulous breast.  We have reassured her they are both excellent forms of imaging and can be supplementing each other.  With regards to BSO, we have previously discussed that she needs to follow-up with her gynecologist, she expressed understanding.  She should continue to follow-up with her other providers, at this time she is not ready for bilateral mastectomy.  Return to clinic for follow-up with [place] in approximately 1 year, alternate breast exams between [place] and GYN.  Total time spent: 30 minutes *Total Encounter Time as defined by the Centers for Medicare and Medicaid Services includes, in addition to the face-to-face time of a patient visit (documented in the note above) non-face-to-face time: obtaining and reviewing outside history, ordering and reviewing medications, tests or procedures, care coordination (communications with other health care professionals or caregivers) and documentation in the medical record.

## 2023-01-14 ENCOUNTER — Telehealth: Payer: Self-pay | Admitting: Hematology and Oncology

## 2023-01-14 NOTE — Telephone Encounter (Signed)
Spoke with patient confirming upcoming appointment  

## 2023-01-26 DIAGNOSIS — E88819 Insulin resistance, unspecified: Secondary | ICD-10-CM | POA: Diagnosis not present

## 2023-01-26 DIAGNOSIS — R5382 Chronic fatigue, unspecified: Secondary | ICD-10-CM | POA: Diagnosis not present

## 2023-01-26 DIAGNOSIS — K219 Gastro-esophageal reflux disease without esophagitis: Secondary | ICD-10-CM | POA: Diagnosis not present

## 2023-01-26 DIAGNOSIS — Z6841 Body Mass Index (BMI) 40.0 and over, adult: Secondary | ICD-10-CM | POA: Diagnosis not present

## 2023-01-26 DIAGNOSIS — N951 Menopausal and female climacteric states: Secondary | ICD-10-CM | POA: Diagnosis not present

## 2023-01-27 ENCOUNTER — Encounter: Payer: Self-pay | Admitting: Hematology and Oncology

## 2023-02-02 DIAGNOSIS — Z6841 Body Mass Index (BMI) 40.0 and over, adult: Secondary | ICD-10-CM | POA: Diagnosis not present

## 2023-02-02 DIAGNOSIS — E88819 Insulin resistance, unspecified: Secondary | ICD-10-CM | POA: Diagnosis not present

## 2023-02-03 ENCOUNTER — Other Ambulatory Visit: Payer: Self-pay | Admitting: Podiatry

## 2023-02-03 ENCOUNTER — Ambulatory Visit: Payer: Medicaid Other | Admitting: Podiatry

## 2023-02-03 DIAGNOSIS — M722 Plantar fascial fibromatosis: Secondary | ICD-10-CM

## 2023-02-03 MED ORDER — TRIAMCINOLONE ACETONIDE 10 MG/ML IJ SUSP
10.0000 mg | Freq: Once | INTRAMUSCULAR | Status: AC
Start: 2023-02-03 — End: 2023-02-03
  Administered 2023-02-03: 10 mg via INTRA_ARTICULAR

## 2023-02-03 MED ORDER — DICLOFENAC SODIUM 75 MG PO TBEC: 75.0000 mg | DELAYED_RELEASE_TABLET | Freq: Two times a day (BID) | ORAL | 2 refills | Status: DC

## 2023-02-03 NOTE — Patient Instructions (Signed)

## 2023-02-04 ENCOUNTER — Encounter: Payer: Self-pay | Admitting: Podiatry

## 2023-02-04 NOTE — Progress Notes (Signed)
Subjective:   Patient ID: Dawn Galvan, female   DOB: 47 y.o.   MRN: [id number]   HPI Patient presents with tremendous pain in the plantar aspect of the right heel and states it has been very sore and hard for her to walk on.  Patient is nervous about medication but is not be able to walk well on these heels.  Patient does not smoke likes to be active   Review of Systems  All other systems reviewed and are negative.       Objective:  Physical Exam Vitals and nursing note reviewed.  Constitutional:      Appearance: She is well-developed.  Pulmonary:     Effort: Pulmonary effort is normal.  Musculoskeletal:        General: Normal range of motion.  Skin:    General: Skin is warm.  Neurological:     Mental Status: She is alert.     Neurovascular status intact muscle strength adequate range of motion adequate with exquisite discomfort plantar aspect right heel at the insertional point of the tendon into the calcaneus with inflammation fluid around the medial band.  Patient is found to have good digital perfusion well-oriented x 3     Assessment:  Acute plantar fasciitis right with inflammation fluid of the medial band at insertion     Plan:  H&P reviewed patient is quite nervous and at this point I do think injection is best for her and after discussion she agrees to do this.  Sterile prep injected the plantar fascia right 3 mg Kenalog 5 mg Xylocaine and instructed on supportive shoes and stretching exercises which were given to patient.  Patient will be seen back to recheck in next month.  X-rays indicate small spur no indication stress fracture arthritis

## 2023-02-18 ENCOUNTER — Telehealth: Payer: Self-pay

## 2023-02-18 DIAGNOSIS — R5382 Chronic fatigue, unspecified: Secondary | ICD-10-CM | POA: Diagnosis not present

## 2023-02-18 DIAGNOSIS — Z6841 Body Mass Index (BMI) 40.0 and over, adult: Secondary | ICD-10-CM | POA: Diagnosis not present

## 2023-02-18 DIAGNOSIS — K219 Gastro-esophageal reflux disease without esophagitis: Secondary | ICD-10-CM | POA: Diagnosis not present

## 2023-02-18 DIAGNOSIS — M2550 Pain in unspecified joint: Secondary | ICD-10-CM | POA: Diagnosis not present

## 2023-02-18 NOTE — Telephone Encounter (Signed)
PA was approved from 02/18/23-02/18/24

## 2023-02-18 NOTE — Telephone Encounter (Signed)
PA request received from covermymeds for Diclofenac Sodium 75mg  DR tablets. PA submitted through covermymeds and waiting on reply.  Dawn Galvan (Key: [place]) PA Case ID #: [id number]

## 2023-02-19 ENCOUNTER — Other Ambulatory Visit: Payer: Self-pay | Admitting: Podiatry

## 2023-02-19 MED ORDER — IBUPROFEN 600 MG PO TABS: 600.0000 mg | ORAL_TABLET | Freq: Three times a day (TID) | ORAL | 0 refills | Status: DC | PRN

## 2023-02-19 NOTE — Telephone Encounter (Signed)
Sent in motrin

## 2023-02-22 ENCOUNTER — Encounter: Payer: Self-pay | Admitting: Hematology and Oncology

## 2023-02-25 ENCOUNTER — Ambulatory Visit: Payer: Medicaid Other

## 2023-02-25 ENCOUNTER — Encounter: Payer: Self-pay | Admitting: Hematology and Oncology

## 2023-02-25 ENCOUNTER — Ambulatory Visit
Admission: RE | Admit: 2023-02-25 | Discharge: 2023-02-25 | Disposition: A | Discharge status: Home / Self Care | Payer: Medicaid Other | Source: Ambulatory Visit | Attending: Hematology and Oncology | Admitting: Hematology and Oncology

## 2023-02-25 DIAGNOSIS — Z1231 Encounter for screening mammogram for malignant neoplasm of breast: Secondary | ICD-10-CM | POA: Diagnosis not present

## 2023-02-25 DIAGNOSIS — Z1509 Genetic susceptibility to other malignant neoplasm: Secondary | ICD-10-CM

## 2023-03-02 ENCOUNTER — Encounter: Payer: Self-pay | Admitting: Hematology and Oncology

## 2023-03-05 DIAGNOSIS — N951 Menopausal and female climacteric states: Secondary | ICD-10-CM | POA: Diagnosis not present

## 2023-03-05 DIAGNOSIS — Z6841 Body Mass Index (BMI) 40.0 and over, adult: Secondary | ICD-10-CM | POA: Diagnosis not present

## 2023-03-05 DIAGNOSIS — E88819 Insulin resistance, unspecified: Secondary | ICD-10-CM | POA: Diagnosis not present

## 2023-03-25 DIAGNOSIS — K219 Gastro-esophageal reflux disease without esophagitis: Secondary | ICD-10-CM | POA: Diagnosis not present

## 2023-03-25 DIAGNOSIS — E88819 Insulin resistance, unspecified: Secondary | ICD-10-CM | POA: Diagnosis not present

## 2023-03-25 DIAGNOSIS — Z6841 Body Mass Index (BMI) 40.0 and over, adult: Secondary | ICD-10-CM | POA: Diagnosis not present

## 2023-04-06 DIAGNOSIS — Z6841 Body Mass Index (BMI) 40.0 and over, adult: Secondary | ICD-10-CM | POA: Diagnosis not present

## 2023-04-06 DIAGNOSIS — R5382 Chronic fatigue, unspecified: Secondary | ICD-10-CM | POA: Diagnosis not present

## 2023-04-06 DIAGNOSIS — N951 Menopausal and female climacteric states: Secondary | ICD-10-CM | POA: Diagnosis not present

## 2023-04-16 ENCOUNTER — Other Ambulatory Visit: Payer: Self-pay | Admitting: Physician Assistant

## 2023-04-16 ENCOUNTER — Ambulatory Visit: Payer: Medicaid Other | Admitting: Family Medicine

## 2023-04-16 DIAGNOSIS — F411 Generalized anxiety disorder: Secondary | ICD-10-CM

## 2023-04-16 DIAGNOSIS — F332 Major depressive disorder, recurrent severe without psychotic features: Secondary | ICD-10-CM

## 2023-04-16 MED ORDER — ESCITALOPRAM OXALATE 10 MG PO TABS: 10.0000 mg | ORAL_TABLET | Freq: Every day | ORAL | 0 refills | Status: DC

## 2023-04-16 NOTE — Progress Notes (Deleted)
   Acute Office Visit  Subjective:     Patient ID: Dawn Galvan, female    DOB: 03/01/1975, 48 y.o.   MRN: [id number]  No chief complaint on file.   HPI Patient is in today for ***  ROS Per HPI      Objective:    There were no vitals taken for this visit.   Physical Exam Vitals and nursing note reviewed.  Constitutional:      Appearance: Normal appearance. She is normal weight.  HENT:     Head: Normocephalic and atraumatic.     Right Ear: Tympanic membrane and ear canal normal.     Left Ear: Tympanic membrane and ear canal normal.     Nose: Nose normal.  Eyes:     Extraocular Movements: Extraocular movements intact.     Pupils: Pupils are equal, round, and reactive to light.  Cardiovascular:     Rate and Rhythm: Normal rate and regular rhythm.     Heart sounds: Normal heart sounds.  Pulmonary:     Effort: Pulmonary effort is normal.     Breath sounds: Normal breath sounds.  Musculoskeletal:        General: Normal range of motion.     Cervical back: Normal range of motion.  Neurological:     General: No focal deficit present.     Mental Status: She is alert and oriented to person, place, and time.  Psychiatric:        Mood and Affect: Mood normal.        Thought Content: Thought content normal.   No results found for any visits on 04/16/23.      Assessment & Plan:  ***  No orders of the defined types were placed in this encounter.   No follow-ups on file.  Moshe Cipro, FNP

## 2023-04-19 DIAGNOSIS — M7072 Other bursitis of hip, left hip: Secondary | ICD-10-CM | POA: Diagnosis not present

## 2023-04-28 ENCOUNTER — Encounter: Payer: Self-pay | Admitting: Genetic Counselor

## 2023-04-28 DIAGNOSIS — K219 Gastro-esophageal reflux disease without esophagitis: Secondary | ICD-10-CM | POA: Diagnosis not present

## 2023-04-28 DIAGNOSIS — Z6841 Body Mass Index (BMI) 40.0 and over, adult: Secondary | ICD-10-CM | POA: Diagnosis not present

## 2023-04-28 DIAGNOSIS — E88819 Insulin resistance, unspecified: Secondary | ICD-10-CM | POA: Diagnosis not present

## 2023-04-28 DIAGNOSIS — N951 Menopausal and female climacteric states: Secondary | ICD-10-CM | POA: Diagnosis not present

## 2023-05-06 ENCOUNTER — Ambulatory Visit: Payer: Medicaid Other | Admitting: Physician Assistant

## 2023-05-12 DIAGNOSIS — Z6841 Body Mass Index (BMI) 40.0 and over, adult: Secondary | ICD-10-CM | POA: Diagnosis not present

## 2023-05-12 DIAGNOSIS — E88819 Insulin resistance, unspecified: Secondary | ICD-10-CM | POA: Diagnosis not present

## 2023-05-14 ENCOUNTER — Encounter: Payer: Self-pay | Admitting: Podiatry

## 2023-05-18 ENCOUNTER — Other Ambulatory Visit: Payer: Self-pay

## 2023-05-18 ENCOUNTER — Other Ambulatory Visit: Payer: Self-pay | Admitting: Physician Assistant

## 2023-05-18 ENCOUNTER — Encounter: Payer: Self-pay | Admitting: Physician Assistant

## 2023-05-18 DIAGNOSIS — F332 Major depressive disorder, recurrent severe without psychotic features: Secondary | ICD-10-CM

## 2023-05-18 DIAGNOSIS — F411 Generalized anxiety disorder: Secondary | ICD-10-CM

## 2023-05-18 MED ORDER — ESCITALOPRAM OXALATE 10 MG PO TABS: 10.0000 mg | ORAL_TABLET | Freq: Every day | ORAL | 0 refills | Status: DC

## 2023-05-18 MED ORDER — DICLOFENAC SODIUM 3 % EX GEL: 1.0000 | CUTANEOUS | 3 refills | Status: AC | PRN

## 2023-05-18 NOTE — Telephone Encounter (Signed)
 Please see message as Dawn Galvan; pt last seen in office 03/05/22

## 2023-05-18 NOTE — Telephone Encounter (Signed)
 Please patient response and respond to patient directly

## 2023-05-19 ENCOUNTER — Telehealth: Payer: Self-pay | Admitting: Physician Assistant

## 2023-05-19 NOTE — Telephone Encounter (Signed)
 Left patient vm that a small script of lexapro was sent to pharmacy as a courtesy to give patient enough time to get yearly appointment scheduled.  I have advised to call back as soon as possible to get appointment scheduled to avoid any lapse of medication.

## 2023-05-26 ENCOUNTER — Other Ambulatory Visit: Payer: Self-pay | Admitting: Physician Assistant

## 2023-05-26 DIAGNOSIS — F332 Major depressive disorder, recurrent severe without psychotic features: Secondary | ICD-10-CM

## 2023-05-26 DIAGNOSIS — F411 Generalized anxiety disorder: Secondary | ICD-10-CM

## 2023-05-27 NOTE — Telephone Encounter (Signed)
 Noted and agreed, thank you.

## 2023-05-27 NOTE — Telephone Encounter (Signed)
 I have spoken with patient in regard.  Patient states she is unable to take time off of work at this moment.    Patient understands that she will need an appointment in order to continue medication.  States she has been going to a UC on Saturdays for current care needs.  States once she is able to get some time off she will call the office to schedule appointment. I have informed patient to ask for me.

## 2023-05-27 NOTE — Telephone Encounter (Signed)
 Please see notes regarding pt and medication refills

## 2023-06-14 ENCOUNTER — Encounter: Payer: Self-pay | Admitting: Podiatry

## 2023-06-16 DIAGNOSIS — Z6841 Body Mass Index (BMI) 40.0 and over, adult: Secondary | ICD-10-CM | POA: Diagnosis not present

## 2023-06-16 DIAGNOSIS — R5382 Chronic fatigue, unspecified: Secondary | ICD-10-CM | POA: Diagnosis not present

## 2023-06-17 ENCOUNTER — Telehealth: Payer: Self-pay | Admitting: Podiatry

## 2023-06-17 NOTE — Telephone Encounter (Signed)
 Note: The patient left a message for Dr. Celia Coles through MyChart on 4/21, expressing frustration due to not receiving a response. She is experiencing significant pain and is requesting a letter for her employer to allow the use of more comfortable footwear, which is currently not permitted without medical documentation.  Patient's message sent via MyChart on 4/21: "Good evening. I am requesting a doctor's note for work to allow me to wear sneakers or other comfortable footwear. I continue to experience pain and have found some orthopedic shoes that provide relief; however, many of these are in the form of sneakers or flip-flop styles, which are not approved at my workplace without a doctor's note. I would like to be approved to wear these types of shoes for a few months until the pain subsides."  Action requested: Can we provide a letter for the patient? If so, what specific types of shoes should be included in the documentation?

## 2023-06-17 NOTE — Telephone Encounter (Signed)
 error

## 2023-06-18 ENCOUNTER — Encounter: Payer: Self-pay | Admitting: Podiatry

## 2023-06-23 ENCOUNTER — Other Ambulatory Visit: Payer: Self-pay | Admitting: Physician Assistant

## 2023-06-23 DIAGNOSIS — F332 Major depressive disorder, recurrent severe without psychotic features: Secondary | ICD-10-CM

## 2023-06-23 DIAGNOSIS — F411 Generalized anxiety disorder: Secondary | ICD-10-CM

## 2023-06-23 NOTE — Telephone Encounter (Signed)
 Last OV 03/21/2022

## 2023-06-24 NOTE — Telephone Encounter (Signed)
 Patient has requested virtual appointment.  States she takes lunch between 130pm and 2pm.  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   Patient states she is doing better with having a vehicle and job.  States she does not want to feel the way she did when she attempted suicide.  We discussed going to the [facility] for urgent needs.  Patient states she is doing better now and has no thoughts of suicide.   States she is afraid to ask off in fear of losing her job.  States she likes her job and she does good when she is busy during the week working.    I have advised in person visits are required at least once a year.   I have asked for patient to follow up with employer to inquire on a date she could work in an appointment.  I will follow back up with patient tomorrow.

## 2023-06-24 NOTE — Telephone Encounter (Signed)
 Pt made an appointment on Mychart for a virtual. Pt was advised this needed to be in office due to not being seen within the last year. Patient sent several messages stating why she can not come in. Patient called [id number] to discuss the situation. I checked with supervisor & provider. It was agreed patient needs to come in for an in person visit. Called & LVM advising patient needs  come in for scheduled appointment, or we can reschedule & try to work around her work schedule. Waiting for call back.

## 2023-06-25 NOTE — Telephone Encounter (Signed)
 Please advise if you would like to add patient to your schedule today for virtual visit at 1:30pm

## 2023-06-25 NOTE — Telephone Encounter (Signed)
 Appointment for Tuesday has been changed to virtual.   Patient has scheduled CPE for September.    I have informed patient to reach out to me if she needs anything.

## 2023-06-25 NOTE — Telephone Encounter (Signed)
 Looks like patient was scheduled for Tuesday in office

## 2023-06-28 ENCOUNTER — Encounter: Payer: Self-pay | Admitting: Physician Assistant

## 2023-06-29 ENCOUNTER — Encounter: Payer: Self-pay | Admitting: Physician Assistant

## 2023-06-29 ENCOUNTER — Telehealth (INDEPENDENT_AMBULATORY_CARE_PROVIDER_SITE_OTHER): Admitting: Physician Assistant

## 2023-06-29 DIAGNOSIS — F411 Generalized anxiety disorder: Secondary | ICD-10-CM

## 2023-06-29 DIAGNOSIS — K219 Gastro-esophageal reflux disease without esophagitis: Secondary | ICD-10-CM | POA: Diagnosis not present

## 2023-06-29 DIAGNOSIS — F332 Major depressive disorder, recurrent severe without psychotic features: Secondary | ICD-10-CM

## 2023-06-29 MED ORDER — OMEPRAZOLE 20 MG PO CPDR: 20.0000 mg | DELAYED_RELEASE_CAPSULE | Freq: Every day | ORAL | 1 refills | Status: DC

## 2023-06-29 MED ORDER — ESCITALOPRAM OXALATE 10 MG PO TABS: 10.0000 mg | ORAL_TABLET | Freq: Every day | ORAL | 1 refills | Status: DC

## 2023-06-29 NOTE — Telephone Encounter (Signed)
 Duplicate msg nothing needed

## 2023-06-29 NOTE — Patient Instructions (Signed)
  VISIT SUMMARY: Today, we reviewed your ongoing treatments for acid reflux and anxiety disorder. Your medications are working well, and we have provided refills for both.  YOUR PLAN: ACID REFLUX: You have chronic acid reflux that is being managed with omeprazole . -Continue taking omeprazole  20 mg once daily. We have provided a 90-day supply with one refill.  ANXIETY DISORDER: Your anxiety disorder is controlled with Lexapro . -Continue taking Lexapro  10 mg daily. We have provided a 90-day supply with one refill.                      Contains text generated by [employer].                                 Contains text generated by [employer].

## 2023-06-29 NOTE — Progress Notes (Signed)
   Virtual Visit via Video Note  I connected with  Dawn Galvan  on 06/29/23 at  1:30 PM EDT by a video enabled telemedicine application and verified that I am speaking with the correct person using two identifiers.  Location: Patient: car  Provider: Nature conservation officer at [employer] Persons present: Patient and myself   I discussed the limitations of evaluation and management by telemedicine and the availability of in person appointments. The patient expressed understanding and agreed to proceed.   History of Present Illness:  Discussed the use of AI scribe software for clinical note transcription with the patient, who gave verbal consent to proceed.  History of Present Illness Dawn Galvan is a 48 year old female who presents for a follow-up visit.  She is stable on Lexapro  10 mg daily for anxiety and omeprazole  20 mg once daily for acid reflux. Omeprazole  is essential for managing symptoms, particularly when consuming spicy foods. She is also under the care of a podiatrist for plantar fasciitis.     Observations/Objective:   Gen: Awake, alert, no acute distress Resp: Breathing is even and non-labored Psych: calm/pleasant demeanor Neuro: Alert and Oriented x 3, + facial symmetry, speech is clear.   Assessment and Plan:  Assessment and Plan Assessment & Plan Acid reflux Chronic acid reflux managed with omeprazole . - Prescribe omeprazole  20 mg once daily, 90-day supply with one refill.  Anxiety disorder Anxiety disorder controlled with Lexapro  10 mg daily. - Prescribe Lexapro  10 mg daily, 90-day supply with one refill.    Follow Up Instructions:    I discussed the assessment and treatment plan with the patient. The patient was provided an opportunity to ask questions and all were answered. The patient agreed with the plan and demonstrated an understanding of the instructions.   The patient was advised to call back or seek an in-person evaluation  if the symptoms worsen or if the condition fails to improve as anticipated.  Dawn Galvan M Danayah Smyre, PA-C

## 2023-06-29 NOTE — Telephone Encounter (Signed)
 Please see pt msg and concerns regarding script and advise

## 2023-06-30 DIAGNOSIS — R5382 Chronic fatigue, unspecified: Secondary | ICD-10-CM | POA: Diagnosis not present

## 2023-06-30 DIAGNOSIS — E88819 Insulin resistance, unspecified: Secondary | ICD-10-CM | POA: Diagnosis not present

## 2023-06-30 DIAGNOSIS — Z6841 Body Mass Index (BMI) 40.0 and over, adult: Secondary | ICD-10-CM | POA: Diagnosis not present

## 2023-07-05 NOTE — Telephone Encounter (Signed)
 She can have note for tennis shoes

## 2023-07-21 DIAGNOSIS — E88819 Insulin resistance, unspecified: Secondary | ICD-10-CM | POA: Diagnosis not present

## 2023-07-21 DIAGNOSIS — K219 Gastro-esophageal reflux disease without esophagitis: Secondary | ICD-10-CM | POA: Diagnosis not present

## 2023-07-21 DIAGNOSIS — Z6841 Body Mass Index (BMI) 40.0 and over, adult: Secondary | ICD-10-CM | POA: Diagnosis not present

## 2023-07-29 ENCOUNTER — Ambulatory Visit: Admitting: Podiatry

## 2023-07-29 DIAGNOSIS — M722 Plantar fascial fibromatosis: Secondary | ICD-10-CM

## 2023-07-29 DIAGNOSIS — M216X1 Other acquired deformities of right foot: Secondary | ICD-10-CM

## 2023-07-29 DIAGNOSIS — M216X2 Other acquired deformities of left foot: Secondary | ICD-10-CM

## 2023-07-29 MED ORDER — MELOXICAM 15 MG PO TABS: 15.0000 mg | ORAL_TABLET | Freq: Every day | ORAL | 0 refills | Status: DC

## 2023-07-29 MED ORDER — TRIAMCINOLONE ACETONIDE 10 MG/ML IJ SUSP
10.0000 mg | Freq: Once | INTRAMUSCULAR | Status: AC
  Administered 2023-07-29: 10 mg

## 2023-07-29 NOTE — Patient Instructions (Signed)

## 2023-07-29 NOTE — Progress Notes (Signed)
 Chief Complaint  Patient presents with   Foot Pain    Right foot. PF is bothering her again and she would like an injection. Patient has tried stretching, wearing brace.     HPI: 48 y.o. female presenting today with c/o pain in the bottom of the right heel.  Pain gets quite severe for the patient.  She has tried home night splint.  This has been recurrent for her, she was treated for this back in December, it has been painful for the past 2 months.  Past Medical History:  Diagnosis Date   Abnormal chest x-ray 03/24/2017   Acute hypoxemic respiratory failure (HCC) 10/19/2019   Carpal tunnel syndrome    right   Carpal tunnel syndrome of left wrist 07/2013   COVID-19 10/16/2019   DJD (degenerative joint disease)    Dysmenorrhea    Epistaxis 05/06/2020   Family history of breast cancer    PALB2 Mutation in Mother   Family history of prostate cancer    GERD (gastroesophageal reflux disease)    Headache(784.0)    migraines or sinus   History of vertigo    states comes and goes; no current med.   Morbid obesity with BMI of 45.0-49.9, adult (HCC) 02/07/2014   Obesity    Osteoarthritis    Plantar fasciitis    Pneumonia due to COVID-19 virus 10/16/2019   Formatting of this note might be different from the original. Last Assessment & Plan:  Formatting of this note might be different from the original. Patient seen via telehealth visit for COVID-19 infection. Comorbid obesity and pre-DM. Reports symptoms for the past week, and positive test 3 days ago. Complains of persistent fever with Tmax of 100.7, chills, mild SOB, body aches which are improving   PONV (postoperative nausea and vomiting)    Rash 08/10/2013   right thigh   Rash of neck 05/06/2020   Sleep apnea    does not wear cpap   Sore in mouth 05/06/2020   Sore throat 05/22/2019   Tenosynovitis    Past Surgical History:  Procedure Laterality Date   CARPAL TUNNEL RELEASE Left 08/18/2013   Procedure: LEFT CARPAL TUNNEL  RELEASE;  Surgeon: Milagros Alf, MD;  Location: [facility];  Service: Orthopedics;  Laterality: Left;   CARPAL TUNNEL RELEASE Right 04/29/2017   Procedure: RIGHT CARPAL TUNNEL RELEASE;  Surgeon: Brunilda Capra, MD;  Location: [facility];  Service: Orthopedics;  Laterality: Right;   CESAREAN SECTION     CHOLECYSTECTOMY     COLONOSCOPY  01/24/2007   Small internal hemorrhoids (most likey etiology of bright red blood per rectum). Otherwise normal colonoscopy to cecum   HERNIA REPAIR     x 2   HYSTEROPLASTY REPAIR OF UTERINE ANOMALY     HYSTEROSCOPY WITH D & C  01/13/2012   Procedure: DILATATION AND CURETTAGE /HYSTEROSCOPY;  Surgeon: Julianne Octave, MD;  Location: [facility];  Service: Gynecology;  Laterality: N/A;  Pap smear   LAPAROSCOPIC GASTRIC SLEEVE RESECTION  2016   TOE SURGERY Left    second toe. rod placed   WISDOM TOOTH EXTRACTION  11/20/2010   Allergies  Allergen Reactions   Doxycycline  Nausea And Vomiting    Dizziness    Other Itching and Other (See Comments)    Seasonal allergies- Itchy eyes, runny nose, etc..     Physical Exam: General: The patient is alert and oriented x3 in no acute distress.  Dermatology:  No ecchymosis, erythema, or edema bilateral.  No open lesions.  Vascular: Palpable pedal pulses bilaterally. Capillary refill within normal limits.  No appreciable edema.    Neurological: Epicritic sensation is intact  Musculoskeletal Exam:  There is pain on palpation of the plantarmedial & plantarcentral aspect of right heel.  No gaps or nodules within the plantar fascia.  Positive Windlass mechanism bilateral.  Antalgic gait noted with first few steps upon standing.  No pain on palpation of achilles tendon bilateral.  Ankle df less than 10 degrees with knee extended b/l.   Assessment/Plan of Care: 1. Plantar fasciitis of right foot   2. Acquired equinus deformity of both feet     Meds ordered this encounter  Medications   meloxicam  (MOBIC ) 15 MG tablet    Sig: Take 1  tablet (15 mg total) by mouth daily.    Dispense:  30 tablet    Refill:  0   triamcinolone  acetonide (KENALOG ) 10 MG/ML injection 10 mg   None  -Reviewed etiology of plantar fasciitis with patient.  Discussed treatment options with patient today, including cortisone injection, NSAID course of treatment, stretching exercises, physical therapy, use of night splint, rest, icing the heel, arch supports/orthotics, and supportive shoe gear.    With the patient's verbal consent, a corticosteroid injection was administered to the right heel, consisting of a mixture of 1% lidocaine  plain, 0.5% Sensorcaine  plain, and Kenalog -10 for a total of 2.0cc administered.  A Band-aid was applied.  Patient tolerated injection well.  Plantar fascia brace dispensed to the patient to be worn with weightbearing.  This is a ankle gauntlet device with elastic strap to offload the plantar fascia and medial arch with weightbearing activity.  Stretching exercises discussed at length with patient with written instructions dispensed.  Prescribed course of oral meloxicam .  Return in about 3 weeks (around 08/19/2023) for Plantar Fasciitis.   Brandan Robicheaux L. Lunda Salines, AACFAS [facility]     [address] [place], [place] [address]                Office [phone]  Fax [phone]

## 2023-08-04 DIAGNOSIS — Z6841 Body Mass Index (BMI) 40.0 and over, adult: Secondary | ICD-10-CM | POA: Diagnosis not present

## 2023-08-04 DIAGNOSIS — R5382 Chronic fatigue, unspecified: Secondary | ICD-10-CM | POA: Diagnosis not present

## 2023-08-18 DIAGNOSIS — Z6841 Body Mass Index (BMI) 40.0 and over, adult: Secondary | ICD-10-CM | POA: Diagnosis not present

## 2023-08-18 DIAGNOSIS — G479 Sleep disorder, unspecified: Secondary | ICD-10-CM | POA: Diagnosis not present

## 2023-08-19 ENCOUNTER — Encounter: Payer: Self-pay | Admitting: Podiatry

## 2023-08-19 ENCOUNTER — Ambulatory Visit: Admitting: Podiatry

## 2023-08-19 DIAGNOSIS — M722 Plantar fascial fibromatosis: Secondary | ICD-10-CM

## 2023-08-20 NOTE — Progress Notes (Signed)
 Subjective:   Patient ID: Dawn Galvan, female   DOB: 48 y.o.   MRN: [id number]   HPI Patient states she is improved still having discomfort but quite a bit better than previously   ROS      Objective:  Physical Exam  Patient's plantar fascia right improved slight discomfort but much better than it was     Assessment:  Acute plantar fasciitis right     Plan:  H&P reviewed condition and organ to try to hold off on further injection and just do stretching and I want patient to purchase a air fracture walker to reduce pressure and this may ultimately require more aggressive procedure but I wanted to mobilize before we would consider this and I reviewed the possible surgery endoscopic release continue boot usage that she will start and by herself and stretching

## 2023-09-01 ENCOUNTER — Other Ambulatory Visit: Payer: Self-pay | Admitting: Podiatry

## 2023-09-01 DIAGNOSIS — Z6841 Body Mass Index (BMI) 40.0 and over, adult: Secondary | ICD-10-CM | POA: Diagnosis not present

## 2023-09-01 DIAGNOSIS — K219 Gastro-esophageal reflux disease without esophagitis: Secondary | ICD-10-CM | POA: Diagnosis not present

## 2023-09-01 DIAGNOSIS — M2550 Pain in unspecified joint: Secondary | ICD-10-CM | POA: Diagnosis not present

## 2023-09-01 DIAGNOSIS — M722 Plantar fascial fibromatosis: Secondary | ICD-10-CM

## 2023-09-01 DIAGNOSIS — G479 Sleep disorder, unspecified: Secondary | ICD-10-CM | POA: Diagnosis not present

## 2023-09-22 DIAGNOSIS — R5382 Chronic fatigue, unspecified: Secondary | ICD-10-CM | POA: Diagnosis not present

## 2023-09-22 DIAGNOSIS — Z6841 Body Mass Index (BMI) 40.0 and over, adult: Secondary | ICD-10-CM | POA: Diagnosis not present

## 2023-10-02 ENCOUNTER — Other Ambulatory Visit: Payer: Self-pay | Admitting: Podiatry

## 2023-10-02 DIAGNOSIS — M722 Plantar fascial fibromatosis: Secondary | ICD-10-CM

## 2023-10-06 DIAGNOSIS — Z6841 Body Mass Index (BMI) 40.0 and over, adult: Secondary | ICD-10-CM | POA: Diagnosis not present

## 2023-10-06 DIAGNOSIS — K219 Gastro-esophageal reflux disease without esophagitis: Secondary | ICD-10-CM | POA: Diagnosis not present

## 2023-10-20 ENCOUNTER — Ambulatory Visit: Payer: Self-pay | Admitting: Adult Health

## 2023-10-20 DIAGNOSIS — R600 Localized edema: Secondary | ICD-10-CM | POA: Diagnosis not present

## 2023-10-20 DIAGNOSIS — R5382 Chronic fatigue, unspecified: Secondary | ICD-10-CM | POA: Diagnosis not present

## 2023-10-20 DIAGNOSIS — R0600 Dyspnea, unspecified: Secondary | ICD-10-CM | POA: Diagnosis not present

## 2023-10-20 DIAGNOSIS — R9431 Abnormal electrocardiogram [ECG] [EKG]: Secondary | ICD-10-CM | POA: Diagnosis not present

## 2023-10-20 DIAGNOSIS — Z6841 Body Mass Index (BMI) 40.0 and over, adult: Secondary | ICD-10-CM | POA: Diagnosis not present

## 2023-10-20 NOTE — Telephone Encounter (Signed)
 There is a same day with Mannam tomorrow open  I called the pt and there was no answer- LMTCB

## 2023-10-20 NOTE — Telephone Encounter (Signed)
 FYI Only or Action Required?: Action required by provider: Refusing ED, requesting appt.  Patient is followed in Pulmonology for asthma and pulmonary nodules, last seen on 07/13/2022 by Galvan, Dawn RAMAN, Dawn Galvan.  Called Nurse Triage reporting Shortness of Breath, other doc heard fluid and wheezing to auscultation, and Cough.  Symptoms began today.  Interventions attempted: Prescription medications: flonase , Maintenance inhaler, and Other: had previously scheduled appt today with wt loss doc who auscultated fluid and wheezing in lungs.  Symptoms are: rapidly worsening.  Triage Disposition: Go to ED Now (Notify PCP)  Patient/caregiver understands and will follow disposition?: No, refuses disposition      Copied from CRM [id number]. Topic: Clinical - Red Word Triage >> Oct 20, 2023  3:16 PM Dawn Galvan wrote: Red Word that prompted transfer to Nurse Triage:   SOB all day Was seen by weight loss doctor today, hearing fluid on her lungs as well as wheezing.   Pt of Dawn Galvan Reason for Disposition  [1] MODERATE difficulty breathing (e.g., speaks in phrases, SOB even at rest, pulse 100-120) AND [2] NEW-onset or WORSE than normal  Answer Assessment - Initial Assessment Questions E2C2 Pulmonary Triage - Initial Assessment Questions Chief Complaint (e.g., cough, sob, wheezing, fever, chills, sweat or additional symptoms) *Go to specific symptom protocol after initial questions. SOB with activity, rest, and talking Doc heard a little fluid on chest and wheezing No chest pain or dizziness, no weakness Coughing, not coughing anything up Coughing fits No fever, chills, nausea, sweating Speaking in full sentences  How long have symptoms been present? Just today all day  Have you used your inhalers/maintenance medication? No If yes, What medications? Wixela Flonase  No rescue inhaler  OXYGEN : Do you wear supplemental oxygen ? No  Do you monitor your oxygen  levels? No  6.  CARDIAC HISTORY: Do you have any history of heart disease? (e.g., heart attack, angina, bypass surgery, angioplasty)      Denies hx blood clots in legs or lungs  7. LUNG HISTORY: Do you have any history of lung disease?  (e.g., pulmonary embolus, asthma, emphysema)    Pulm nodules, sleep apnea, asthma     Advised pt go to ED for symptoms, pt refusing, requesting appt with pulm today, advised that sending message for pulm to give her call back with further recommendations, advised pt go to hospital if any worsening, pt refusing and states that she'd go to UC.  Protocols used: Breathing Difficulty-A-AH

## 2023-10-21 ENCOUNTER — Ambulatory Visit: Payer: Self-pay

## 2023-10-21 ENCOUNTER — Telehealth (HOSPITAL_BASED_OUTPATIENT_CLINIC_OR_DEPARTMENT_OTHER): Payer: Self-pay

## 2023-10-21 ENCOUNTER — Ambulatory Visit: Admitting: Pulmonary Disease

## 2023-10-21 NOTE — Telephone Encounter (Signed)
   Copied from CRM #[id number]. Topic: Clinical - Red Word Triage >> Oct 21, 2023  1:36 PM Leila BROCKS wrote: Red Word that prompted transfer to Nurse Triage: Patient [phone] wants to cancel appointment for today at 10/21/23 at 2:15 pm with Dr. Theophilus, due to dizziness, shortness of breath, and wants to stay in bed. Patient denies pain nor fever. Patient wants to come in tomorrow. Please advise.  Pt. Refuses triage, I'm just tired and I don't want to talk.Someone will call me tomorrow if they have an appointment.Instructed pt. There is no availability tomorrow. States she will call back. Almeda in the practice notified and will cancel today's appointment at pt. Request.

## 2023-10-21 NOTE — Telephone Encounter (Signed)
 Patient has an appointment with Dr. Theophilus today at 2:15 pm.  Nothing further needed.

## 2023-10-21 NOTE — Telephone Encounter (Unsigned)
 Copied from CRM [id number]. Topic: Clinical - Red Word Triage >> Oct 20, 2023  3:16 PM Russell PARAS wrote: Red Word that prompted transfer to Nurse Triage:   SOB all day Was seen by weight loss doctor today, hearing fluid on her lungs as well as wheezing.   Pt of Tammy Parrett >> Oct 20, 2023  3:57 PM Corean SAUNDERS wrote: Patient declined appointment with Dr. Theophilus tomorrow and states she will be going to urgent care.

## 2023-10-21 NOTE — Telephone Encounter (Signed)
 No openings tomorrow. NFN

## 2023-10-22 ENCOUNTER — Telehealth: Payer: Self-pay | Admitting: Adult Health

## 2023-10-26 NOTE — Telephone Encounter (Signed)
 No Notes in Encounter - Closing

## 2023-11-08 ENCOUNTER — Ambulatory Visit (INDEPENDENT_AMBULATORY_CARE_PROVIDER_SITE_OTHER): Admitting: Physician Assistant

## 2023-11-08 ENCOUNTER — Encounter: Payer: Self-pay | Admitting: Physician Assistant

## 2023-11-08 VITALS — BP 130/86 | HR 83 | Temp 98.1°F | Ht 64.5 in | Wt 296.5 lb

## 2023-11-08 DIAGNOSIS — R600 Localized edema: Secondary | ICD-10-CM | POA: Diagnosis not present

## 2023-11-08 DIAGNOSIS — Z1211 Encounter for screening for malignant neoplasm of colon: Secondary | ICD-10-CM

## 2023-11-08 DIAGNOSIS — K219 Gastro-esophageal reflux disease without esophagitis: Secondary | ICD-10-CM

## 2023-11-08 DIAGNOSIS — Z1159 Encounter for screening for other viral diseases: Secondary | ICD-10-CM | POA: Diagnosis not present

## 2023-11-08 DIAGNOSIS — Z0000 Encounter for general adult medical examination without abnormal findings: Secondary | ICD-10-CM

## 2023-11-08 DIAGNOSIS — F411 Generalized anxiety disorder: Secondary | ICD-10-CM

## 2023-11-08 MED ORDER — FUROSEMIDE 20 MG PO TABS: 20.0000 mg | ORAL_TABLET | Freq: Every day | ORAL | 2 refills | Status: DC | PRN

## 2023-11-08 NOTE — Progress Notes (Signed)
 Patient ID: Celeste Tavenner, female    DOB: [birth year], 48 y.o.   MRN: [id number]   Assessment & Plan:  Annual physical exam -     Comprehensive metabolic panel with GFR -     CBC with Differential/Platelet -     Lipid panel -     TSH -     Hemoglobin A1c -     IBC + Ferritin  Need for hepatitis C screening test -     Hepatitis C antibody  Generalized anxiety disorder  Gastroesophageal reflux disease without esophagitis  Screening for colon cancer -     Ambulatory referral to Gastroenterology  Localized edema -     Furosemide ; Take 1 tablet (20 mg total) by mouth daily as needed for edema or fluid.  Dispense: 30 tablet; Refill: 2 -     Brain natriuretic peptide  Morbid obesity (HCC)      Assessment and Plan Assessment & Plan Morbid obesity due to excess calories Morbid obesity with a BMI indicating excess weight. Actively working on [employer] loss with [employer]. Recent weight loss attributed to dietary changes and intermittent fasting. Previous use of Wegovy was discontinued due to lack of energy and effectiveness. Currently using phentermine  for weight management. - Continue weight management with [facility] Providers - Encourage continued dietary changes and exercise  Edema of lower extremities Intermittent edema of lower extremities, previously treated with Lasix . Recent episode of swelling in feet and ankles, possibly related to prolonged sitting. No current signs of fluid overload on chest x-ray or EKG. Lasix  was effective in reducing edema. - Prescribe Lasix  as needed for edema - Advise to monitor for symptoms of fluid overload such as chest pain or shortness of breath  Gastroesophageal reflux disease (GERD) GERD is well-managed on omeprazole  20 mg. No recent exacerbations reported. - Continue omeprazole  20 mg as needed  Generalized anxiety disorder Generalized anxiety disorder is well-managed on Lexapro  10 mg. No recent exacerbations  reported. - Continue Lexapro  10 mg as needed  General Health Maintenance Overdue for MRI mammogram due to family history of breast cancer and positive gene. Needs to follow up with oncologist for MRI scheduling. Discussed potential for breast reduction to decrease cancer risk. Declined hysterectomy despite recommendation due to personal preference and family history of complications. - Follow up with oncologist for MRI mammogram scheduling - Discuss breast reduction with specialist  Pulmonology Follow-Up Requested assistance in scheduling an appointment with pulmonology. No recent contact from pulmonology office. - Contact pulmonology via MyChart for appointment scheduling  Adult Wellness Visit Annual physical examination conducted. Referral for colonoscopy due to being overdue for colon cancer screening. Labs ordered to monitor overall health status. - Send referral to Dr. Charlanne for colonoscopy - Order labs: CBC, CMP, A1c, hep C antibody, ferritin, cholesterol, TSH, BNP   Age-appropriate screening and counseling performed today. Will check labs and call with results. Preventive measures discussed and printed in AVS for patient.   Patient Counseling: [x]   Nutrition: Stressed importance of moderation in sodium/caffeine intake, saturated fat and cholesterol, caloric balance, sufficient intake of fresh fruits, vegetables, and fiber.  [x]   Stressed the importance of regular exercise.   [x]   Substance Abuse: Discussed cessation/primary prevention of tobacco, alcohol, or other drug use; driving or other dangerous activities under the influence; availability of treatment for abuse.   []   Injury prevention: Discussed safety belts, safety helmets, smoke detector, smoking near bedding or upholstery.   []   Sexuality:  Discussed sexually transmitted diseases, partner selection, use of condoms, avoidance of unintended pregnancy  and contraceptive alternatives.   [x]   Dental health: Discussed importance  of regular tooth brushing, flossing, and dental visits.  [x]   Health maintenance and immunizations reviewed. Please refer to Health maintenance section.        Return in about 1 year (around 11/07/2024) for physical, fasting labs .    Subjective:    Chief Complaint  Patient presents with   Annual Exam    Non fasting w/ labs     HPI Discussed the use of AI scribe software for clinical note transcription with the patient, who gave verbal consent to proceed.  History of Present Illness Lakresha Stifter Laborde is a 48 year old female who presents for an annual physical exam.  She is experiencing lower leg edema, which she attributes to prolonged sitting at her job. Two weeks ago, she visited an urgent care due to significant swelling in her feet and knees and was prescribed Lasix  for 15 days. The swelling has reduced, but she remains concerned about fluid retention. No chest pain, shortness of breath, black stools, or pencil-thin stools. The patient previously experienced wheezing and shortness of breath, which improved after taking Lasix .  She has a history of anxiety and is stable on Lexapro  10 mg daily. She is not currently seeing a therapist due to scheduling conflicts with her job but is seeking a therapist who can also provide wellness coaching.  She has a history of GERD and is stable on omeprazole  20 mg daily.  She is working on weight loss. She attributes this to intermittent fasting, eating between 12 PM and 7 PM, and discontinuing Wegovy due to lack of energy and effectiveness. She is now back on phentermine , which she finds gives her energy.  She has a history of plantar fasciitis, which has improved significantly with weight loss. She was previously prescribed meloxicam  for pain but has not needed it recently.  She has a family history of breast cancer and is positive for the gene. She is overdue for an MRI mammogram, which she has been trying to schedule. Her large breast size  complicates mammogram readings, necessitating MRI follow-ups.  She denies smoking, vaping, and regular alcohol use, identifying as a social drinker. She works at a [employer] and has a sedentary job.     Past Medical History:  Diagnosis Date   Abnormal chest x-ray 03/24/2017   Acute hypoxemic respiratory failure (HCC) 10/19/2019   Carpal tunnel syndrome    right   Carpal tunnel syndrome of left wrist 07/2013   COVID-19 10/16/2019   DJD (degenerative joint disease)    Dysmenorrhea    Epistaxis 05/06/2020   Family history of breast cancer    PALB2 Mutation in Mother   Family history of prostate cancer    GERD (gastroesophageal reflux disease)    Headache(784.0)    migraines or sinus   History of vertigo    states comes and goes; no current med.   Morbid obesity with BMI of 45.0-49.9, adult (HCC) 02/07/2014   Obesity    Osteoarthritis    Plantar fasciitis    Pneumonia due to COVID-19 virus 10/16/2019   Formatting of this note might be different from the original. Last Assessment & Plan:  Formatting of this note might be different from the original. Patient seen via telehealth visit for COVID-19 infection. Comorbid obesity and pre-DM. Reports symptoms for the past week, and positive test 3 days ago.  Complains of persistent fever with Tmax of 100.7, chills, mild SOB, body aches which are improving   PONV (postoperative nausea and vomiting)    Rash 08/10/2013   right thigh   Rash of neck 05/06/2020   Sleep apnea    does not wear cpap   Sore in mouth 05/06/2020   Sore throat 05/22/2019   Tenosynovitis     Past Surgical History:  Procedure Laterality Date   CARPAL TUNNEL RELEASE Left 08/18/2013   Procedure: LEFT CARPAL TUNNEL RELEASE;  Surgeon: Franky JONELLE Curia, MD;  Location: [facility];  Service: Orthopedics;  Laterality: Left;   CARPAL TUNNEL RELEASE Right 04/29/2017   Procedure: RIGHT CARPAL TUNNEL RELEASE;  Surgeon: Curia Franky, MD;  Location: [facility];  Service: Orthopedics;   Laterality: Right;   CESAREAN SECTION     CHOLECYSTECTOMY     COLONOSCOPY  01/24/2007   Small internal hemorrhoids (most likey etiology of bright red blood per rectum). Otherwise normal colonoscopy to cecum   HERNIA REPAIR     x 2   HYSTEROPLASTY REPAIR OF UTERINE ANOMALY     HYSTEROSCOPY WITH D & C  01/13/2012   Procedure: DILATATION AND CURETTAGE /HYSTEROSCOPY;  Surgeon: Gloris DELENA Hugger, MD;  Location: [facility];  Service: Gynecology;  Laterality: N/A;  Pap smear   LAPAROSCOPIC GASTRIC SLEEVE RESECTION  2016   TOE SURGERY Left    second toe. rod placed   WISDOM TOOTH EXTRACTION  11/20/2010    Family History  Problem Relation Age of Onset   Hypertension Mother    Breast cancer Mother    Hypertension Father    Diabetes Maternal Grandmother    Diabetes Paternal Grandmother     Social History   Tobacco Use   Smoking status: Former    Types: Cigarettes    Start date: 02/22/2009    Quit date: 02/22/2009    Years since quitting: 14.7   Smokeless tobacco: Never  Vaping Use   Vaping status: Never Used  Substance Use Topics   Alcohol use: Not Currently    Comment: Social   Drug use: No     Allergies  Allergen Reactions   Doxycycline  Nausea And Vomiting    Dizziness    Other Itching and Other (See Comments)    Seasonal allergies- Itchy eyes, runny nose, etc..    Review of Systems NEGATIVE UNLESS OTHERWISE INDICATED IN HPI      Objective:     BP 130/86 (BP Location: Left Arm, Patient Position: Sitting, Cuff Size: Large)   Pulse 83   Temp 98.1 F (36.7 C) (Temporal)   Ht 5' 4.5 (1.638 m)   Wt 296 lb 8 oz (134.5 kg)   SpO2 94%   BMI 50.11 kg/m   Wt Readings from Last 3 Encounters:  11/08/23 296 lb 8 oz (134.5 kg)  01/13/23 (!) 317 lb 1.6 oz (143.8 kg)  10/27/22 (!) 313 lb 1.6 oz (142 kg)    BP Readings from Last 3 Encounters:  11/08/23 130/86  01/13/23 (!) 152/83  10/27/22 (!) 140/81     Physical Exam Vitals and nursing note reviewed.   Constitutional:      Appearance: Normal appearance. She is obese. She is not toxic-appearing.  HENT:     Head: Normocephalic and atraumatic.     Right Ear: Tympanic membrane, ear canal and external ear normal.     Left Ear: Tympanic membrane, ear canal and external ear normal.     Nose: Nose normal.  Mouth/Throat:     Mouth: Mucous membranes are moist.  Eyes:     Extraocular Movements: Extraocular movements intact.     Conjunctiva/sclera: Conjunctivae normal.     Pupils: Pupils are equal, round, and reactive to light.  Cardiovascular:     Rate and Rhythm: Normal rate and regular rhythm.     Pulses: Normal pulses.     Heart sounds: Normal heart sounds.  Pulmonary:     Effort: Pulmonary effort is normal.     Breath sounds: Normal breath sounds.  Abdominal:     General: Abdomen is flat. Bowel sounds are normal.     Palpations: Abdomen is soft.  Musculoskeletal:        General: Normal range of motion.     Cervical back: Normal range of motion and neck supple.     Right lower leg: Edema present.     Left lower leg: Edema present.  Skin:    General: Skin is warm and dry.     Findings: Lesion (skin tag R side of neck) present.  Neurological:     General: No focal deficit present.     Mental Status: She is alert and oriented to person, place, and time.  Psychiatric:        Mood and Affect: Mood normal.        Behavior: Behavior normal.        Thought Content: Thought content normal.        Judgment: Judgment normal.             Nya Monds M Kailia Starry, PA-C

## 2023-11-09 ENCOUNTER — Other Ambulatory Visit: Payer: Self-pay

## 2023-11-09 DIAGNOSIS — Z1159 Encounter for screening for other viral diseases: Secondary | ICD-10-CM

## 2023-11-09 DIAGNOSIS — Z0000 Encounter for general adult medical examination without abnormal findings: Secondary | ICD-10-CM

## 2023-11-09 DIAGNOSIS — R600 Localized edema: Secondary | ICD-10-CM

## 2023-11-10 ENCOUNTER — Telehealth: Payer: Self-pay

## 2023-11-10 DIAGNOSIS — F3340 Major depressive disorder, recurrent, in remission, unspecified: Secondary | ICD-10-CM | POA: Diagnosis not present

## 2023-11-10 DIAGNOSIS — K219 Gastro-esophageal reflux disease without esophagitis: Secondary | ICD-10-CM | POA: Diagnosis not present

## 2023-11-10 DIAGNOSIS — Z6841 Body Mass Index (BMI) 40.0 and over, adult: Secondary | ICD-10-CM | POA: Diagnosis not present

## 2023-11-10 NOTE — Telephone Encounter (Signed)
 Copied from CRM [id number]. Topic: Clinical - Request for Lab/Test Order >> Nov 09, 2023  1:22 PM Chasity T wrote: Reason for CRM: Patient is returning the call from someone in the lab regarding getting labs redone but unsure where to get them done at. Called [facility] but wasn't able to find anyone to assist with advising where to go. Please call patient back to let her know what she needs to do.  Lab tech Fitzgerald advised pt taken care of at [facility] on 11/09/23. Sent direct message to Doctor Phillips to advise if patient indeed needs to be contacted regarding future labs or if they were collected correctly.

## 2023-12-08 DIAGNOSIS — E88819 Insulin resistance, unspecified: Secondary | ICD-10-CM | POA: Diagnosis not present

## 2023-12-08 DIAGNOSIS — K219 Gastro-esophageal reflux disease without esophagitis: Secondary | ICD-10-CM | POA: Diagnosis not present

## 2023-12-08 DIAGNOSIS — Z6841 Body Mass Index (BMI) 40.0 and over, adult: Secondary | ICD-10-CM | POA: Diagnosis not present

## 2023-12-22 DIAGNOSIS — Z6841 Body Mass Index (BMI) 40.0 and over, adult: Secondary | ICD-10-CM | POA: Diagnosis not present

## 2023-12-22 DIAGNOSIS — M2550 Pain in unspecified joint: Secondary | ICD-10-CM | POA: Diagnosis not present

## 2024-01-04 ENCOUNTER — Other Ambulatory Visit: Payer: Self-pay | Admitting: Physician Assistant

## 2024-01-04 DIAGNOSIS — F411 Generalized anxiety disorder: Secondary | ICD-10-CM

## 2024-01-04 DIAGNOSIS — F332 Major depressive disorder, recurrent severe without psychotic features: Secondary | ICD-10-CM

## 2024-01-11 DIAGNOSIS — K219 Gastro-esophageal reflux disease without esophagitis: Secondary | ICD-10-CM | POA: Diagnosis not present

## 2024-01-11 DIAGNOSIS — Z6841 Body Mass Index (BMI) 40.0 and over, adult: Secondary | ICD-10-CM | POA: Diagnosis not present

## 2024-01-12 ENCOUNTER — Telehealth: Payer: Self-pay

## 2024-01-12 ENCOUNTER — Encounter: Payer: Self-pay | Admitting: Hematology and Oncology

## 2024-01-12 NOTE — Telephone Encounter (Signed)
 Called patient per MyChart message to further discuss and get a better idea of what may have happened. Dr Loretha ordered for her to have MRI 01/2023 and when [facility] tried to contact her, she advised she would call back, per their note.  PI  pt said she has left msg for the nurse to call her back she will schedule @ that time. left VM 02/25/23 [facility]--pt called back. I let her know we have a larger breast coil to accomodate her body habitus and that we can have her come an hour early to get IV team down to do her IV. She said she will call back to get sheduled [facility]. PI pt is a hard stick she will not schedule until they have someone in place that can get the IV done.    Pt was speaking with

## 2024-01-12 NOTE — Telephone Encounter (Signed)
 Continued from previous:   Pt was speaking loudly and exclaiming how she has had IV started here at [facility] in the past then was directed to [facility] for her MRI. Attempted to help patient by offering a solution, but pt interrupted and yelled louder about how she needs this done. Kindly asked pt to stop yelling and advised the call would be disconnected if she continued to yel;Dawn Galvan Pt yelled back and was difficult to understand. Call disconnected.   After speaking with upper management, a solution will be offered via MyChart.

## 2024-01-14 ENCOUNTER — Inpatient Hospital Stay: Payer: Medicaid Other | Attending: Hematology and Oncology | Admitting: Hematology and Oncology

## 2024-01-26 DIAGNOSIS — E88819 Insulin resistance, unspecified: Secondary | ICD-10-CM | POA: Diagnosis not present

## 2024-01-26 DIAGNOSIS — R5382 Chronic fatigue, unspecified: Secondary | ICD-10-CM | POA: Diagnosis not present

## 2024-01-26 DIAGNOSIS — Z6841 Body Mass Index (BMI) 40.0 and over, adult: Secondary | ICD-10-CM | POA: Diagnosis not present

## 2024-01-28 ENCOUNTER — Other Ambulatory Visit: Payer: Self-pay | Admitting: *Deleted

## 2024-01-28 ENCOUNTER — Encounter: Payer: Self-pay | Admitting: Physician Assistant

## 2024-01-28 ENCOUNTER — Other Ambulatory Visit: Payer: Self-pay

## 2024-01-28 DIAGNOSIS — Z1501 Genetic susceptibility to malignant neoplasm of breast: Secondary | ICD-10-CM

## 2024-01-28 DIAGNOSIS — Z9189 Other specified personal risk factors, not elsewhere classified: Secondary | ICD-10-CM

## 2024-01-28 NOTE — Telephone Encounter (Signed)
 Please see pt msg and advise on any necessary orders.

## 2024-01-30 ENCOUNTER — Other Ambulatory Visit: Payer: Self-pay | Admitting: Physician Assistant

## 2024-01-30 ENCOUNTER — Other Ambulatory Visit: Payer: Self-pay | Admitting: Adult Health

## 2024-01-30 DIAGNOSIS — K219 Gastro-esophageal reflux disease without esophagitis: Secondary | ICD-10-CM

## 2024-02-01 ENCOUNTER — Other Ambulatory Visit: Payer: Self-pay | Admitting: *Deleted

## 2024-02-01 DIAGNOSIS — Z9189 Other specified personal risk factors, not elsewhere classified: Secondary | ICD-10-CM

## 2024-02-01 DIAGNOSIS — Z1589 Genetic susceptibility to other disease: Secondary | ICD-10-CM

## 2024-02-03 ENCOUNTER — Other Ambulatory Visit: Payer: Self-pay | Admitting: Physician Assistant

## 2024-02-03 DIAGNOSIS — R600 Localized edema: Secondary | ICD-10-CM

## 2024-02-09 DIAGNOSIS — R454 Irritability and anger: Secondary | ICD-10-CM | POA: Diagnosis not present

## 2024-02-09 DIAGNOSIS — E88819 Insulin resistance, unspecified: Secondary | ICD-10-CM | POA: Diagnosis not present

## 2024-02-09 DIAGNOSIS — Z6841 Body Mass Index (BMI) 40.0 and over, adult: Secondary | ICD-10-CM | POA: Diagnosis not present

## 2024-02-21 ENCOUNTER — Telehealth: Payer: Self-pay | Admitting: Diagnostic Neuroimaging

## 2024-02-21 NOTE — Telephone Encounter (Signed)
 Patient called in very upset that she has not heard back from anyone to schedule her sleep consult. Stated she left messages every day last week and never heard back from anyone. I let her know we have been short staffed and out multiple days for the holidays, went to send [place] a message but she is out of office today. Patient asked to speak with the manager. I let her know I will put in a message and have someone give her a call back

## 2024-02-22 NOTE — Telephone Encounter (Signed)
 Patient has been contacted - I did reiterate to patient that our office has has reduced hours due to the holidays last week and this week as well as I was out of the office, I did not previously have any VM from patient prior to being out so I did apologize to patient for delay in response. Ultimately referral was declined due to patient being established with [facility] already and pulm hx. Better for patient to be seen by [facility] for sleep concerns than here at [facility]. This was relayed to patient as well.

## 2024-02-29 ENCOUNTER — Telehealth: Payer: Self-pay

## 2024-02-29 NOTE — Telephone Encounter (Signed)
 Received MyChart response back from patient today regarding message from last week about her referral being declined to our office, patient seemed confused about why referral was declined so I called patient to explain better.   Called was initially answered, I heard mumbling but no response back and then it was hung up. I waited a few minutes before calling back and patient called into office. I answered phone and patient asked if this was [place], I stated yes and she began relaying to me what was sent in the MyChart msg. I let patient finished and then apologized for any confusion and explained to her that her referral was declined for two reasons. First reason being that she is an established patient with [facility] and typically our providers do not see their patients as they have sleep providers in their office and read and order their own studies. Second reason being that her message stated this was for Zepbound and our providers do not provide sleep studies for patients only interested in medication. The patient then became upset and started changing her reasoning for the sleep study referral but would not let me talk and would not let me further explain our office policies. She then stated This is why I'm about to cuss you out, you're gonna piss me off The phone disconnected and I am unsure why. I called patient back and she began YELLING at me through the phone You don't get to hang up on me Tannar Broker, you don't hang up on my face when I ask for a manager with major attitude. (This call was on speaker phone so my co-worker Damien has witnessed all of it) I tried explaining to the patient I was not sure what happened to the call which is why I called back and was rudely interrupted multiple times and unable to complete a sentence. The patient kept repeating that it is none of my business what another doctor wants to prescribe her and she should have kept her mouth shut and she wanted to speak to a  production designer, theatre/television/film. Before I could place her on hold to see if Meagan A. Sleep Lab Manager was available she she stated she was hanging up and calling back for a manager herself. Throughout the entire conversation the patient was very hostile, yelling, did not let me speak, and spoke very rudely to me. I did not egg the patient on or engage in arguing, I simply asked for her to not interrupt me so I could place her on hold to see if Meagan A was available and she would not stop interrupting. I did the best I could to diffuse the situation but ultimately the patient did not like anything I said and continued to be upset before ending the call.  After the call ended, Meagan A. Was notified of the interaction and asked that I make a telephone note explaining how the conversation went.

## 2024-03-01 ENCOUNTER — Telehealth: Payer: Self-pay

## 2024-03-01 NOTE — Telephone Encounter (Signed)
 Copied from CRM [id number]. Topic: Referral - Question >> Feb 29, 2024  5:07 PM Charolett L wrote: Reason for CRM: Meagan from Sleep lab manager from [facility] and stated that she needs to discuss the patients referral that was sent to them CB# [phone]  Returned the call from [facility] in regards to sleep study. Received vm and left vm with office callback number asking her to return my call. Referral appears to have been denied by their office stating pt is established with [facility] and should follow up with them for sleep concerns/testing.

## 2024-03-01 NOTE — Telephone Encounter (Signed)
 Megan office manager with [facility] returned my call and advised the referral was denied for two reasons. Patient has not had office visit with notes with PCP for referral to be placed, and also Pt is being seen by [facility]. Patient spoke with Megan and very insistent on having sleep study completed in their office not with pulmonology, and [place] states they are willing to see the patient with new referral placed and appropriate office notes/encounter to justify the referral. Pt can also reach out to her Pulmonologist to complete visit and place orders for sleep study as well. Please advise recommendations for next steps with patient and if you would like patient to schedule an OV to discuss.

## 2024-03-02 ENCOUNTER — Ambulatory Visit: Admitting: Pulmonary Disease

## 2024-03-02 NOTE — Telephone Encounter (Signed)
 Please see patient reply regarding sleep study requested by patient and referral placed and advise

## 2024-03-02 NOTE — Telephone Encounter (Signed)
 Patient scheduled office visit to discuss with PCP

## 2024-03-03 ENCOUNTER — Other Ambulatory Visit: Payer: Self-pay

## 2024-03-05 ENCOUNTER — Ambulatory Visit
Admission: RE | Admit: 2024-03-05 | Discharge: 2024-03-05 | Disposition: A | Source: Ambulatory Visit | Attending: Hematology and Oncology

## 2024-03-05 DIAGNOSIS — Z1509 Genetic susceptibility to other malignant neoplasm: Secondary | ICD-10-CM

## 2024-03-05 DIAGNOSIS — Z9189 Other specified personal risk factors, not elsewhere classified: Secondary | ICD-10-CM

## 2024-03-05 MED ORDER — GADOPICLENOL 0.5 MMOL/ML IV SOLN
10.0000 mL | Freq: Once | INTRAVENOUS | Status: AC | PRN
  Administered 2024-03-05: 10 mL via INTRAVENOUS

## 2024-03-06 ENCOUNTER — Ambulatory Visit: Admitting: Physician Assistant

## 2024-03-07 ENCOUNTER — Encounter: Payer: Self-pay | Admitting: Physician Assistant

## 2024-03-07 ENCOUNTER — Telehealth (INDEPENDENT_AMBULATORY_CARE_PROVIDER_SITE_OTHER): Admitting: Physician Assistant

## 2024-03-07 DIAGNOSIS — Z8669 Personal history of other diseases of the nervous system and sense organs: Secondary | ICD-10-CM | POA: Diagnosis not present

## 2024-03-07 DIAGNOSIS — F411 Generalized anxiety disorder: Secondary | ICD-10-CM | POA: Diagnosis not present

## 2024-03-07 MED ORDER — ESCITALOPRAM OXALATE 20 MG PO TABS: 20.0000 mg | ORAL_TABLET | Freq: Every day | ORAL | 1 refills | Status: AC

## 2024-03-07 NOTE — Progress Notes (Signed)
 "  Virtual Visit via Video Note  I connected with  Dawn Galvan  on 03/07/2024 at  1:00 PM EST by a video enabled telemedicine application and verified that I am speaking with the correct person using two identifiers.  Location: Patient: home Provider: Nature Conservation Officer at [employer] Persons present: Patient and myself   I discussed the limitations of evaluation and management by telemedicine and the availability of in person appointments. The patient expressed understanding and agreed to proceed.   History of Present Illness:  Discussed the use of AI scribe software for clinical note transcription with the patient, who gave verbal consent to proceed.  History of Present Illness Dawn Galvan is a 49 year old female with a history of sleep apnea who presents for a referral for a sleep study.  She has a history of sleep apnea prior to undergoing a sleeve gastrectomy in 2016. Despite being provided with a CPAP machine, she did not use it due to discomfort with facial coverage. She reports poor sleep quality, with frequent awakenings and feeling very tired during the day. She can easily fall asleep if left alone in a quiet room.  She has been under the care of a weight management doctor at [facility] for one to two years. Her current medications include Phentermine , which has variable effectiveness. She previously used Wegovy, which was discontinued due to insurance issues and lack of efficacy. Her last recorded weight was 298 pounds.  Her current medications also include Lexapro  10 mg, which she feels may need to be increased due to heightened emotionality and anxiety, particularly at work. She is also taking omeprazole . She reports significant stress and anxiety at her workplace.  She has a history of asthma and was referred to a pulmonologist after having COVID pneumonia. However, her pulmonologist does not manage her sleep issues.     Observations/Objective:   Gen:  Awake, alert, no acute distress Resp: Breathing is even and non-labored Psych: calm/pleasant demeanor Neuro: Alert and Oriented x 3, + facial symmetry, speech is clear.   Assessment and Plan:  Assessment and Plan Assessment & Plan Morbid obesity Ongoing weight management challenges. Previous sleeve gastrectomy in 2016. Current weight is 298 lbs. Insurance issues with Wegovy, and Phentermine  has variable efficacy. Potential for Zepbound approval if sleep apnea is confirmed. - Continue weight management with [facility]. - Referred for sleep study to evaluate for sleep apnea, which may influence insurance approval for Zepbound  Obstructive sleep apnea Previous CPAP use, which was not tolerated. Symptoms include poor sleep quality, frequent awakenings, and daytime fatigue. Confusion regarding referral process for sleep study with pulmonary group. - Sent referral to [facility] for sleep study.  Generalized anxiety disorder Current treatment of Lexapro  10 mg. Reports high anxiety levels, especially at work, and emotional distress. Discussed potential increase in Lexapro  dosage to 20 mg to better manage symptoms. - Increased Lexapro  to 20 mg daily. - Advised to monitor for excessive sedation and adjust dosage if necessary. - Referral for counseling placed   General health maintenance Routine follow-up and medication management discussed. - Scheduled follow-up appointment in summer or fall for routine check-up and prescription review.    Follow Up Instructions:    I discussed the assessment and treatment plan with the patient. The patient was provided an opportunity to ask questions and all were answered. The patient agreed with the plan and demonstrated an understanding of the instructions.   The patient was advised to call  back or seek an in-person evaluation if the symptoms worsen or if the condition fails to improve as anticipated.  Erle Guster M Girolamo Lortie, PA-C  "

## 2024-03-10 ENCOUNTER — Telehealth: Payer: Self-pay | Admitting: Hematology and Oncology

## 2024-03-10 NOTE — Telephone Encounter (Signed)
 I left voicemail for patient regarding MD visit on 04/13/2024.

## 2024-03-10 NOTE — Telephone Encounter (Signed)
 Patient called in to reschedule 04/13/2024 MD visit to 04/26/2024.

## 2024-03-14 ENCOUNTER — Ambulatory Visit: Admitting: Physician Assistant

## 2024-03-16 ENCOUNTER — Encounter: Payer: Self-pay | Admitting: Adult Health

## 2024-03-16 ENCOUNTER — Ambulatory Visit: Admitting: Adult Health

## 2024-03-16 VITALS — BP 152/93 | HR 76 | Temp 98.1°F | Ht 64.5 in | Wt 311.8 lb

## 2024-03-16 DIAGNOSIS — Z87891 Personal history of nicotine dependence: Secondary | ICD-10-CM

## 2024-03-16 DIAGNOSIS — R0683 Snoring: Secondary | ICD-10-CM

## 2024-03-16 DIAGNOSIS — J069 Acute upper respiratory infection, unspecified: Secondary | ICD-10-CM

## 2024-03-16 DIAGNOSIS — J4530 Mild persistent asthma, uncomplicated: Secondary | ICD-10-CM

## 2024-03-16 DIAGNOSIS — G4719 Other hypersomnia: Secondary | ICD-10-CM

## 2024-03-16 DIAGNOSIS — J45909 Unspecified asthma, uncomplicated: Secondary | ICD-10-CM

## 2024-03-16 DIAGNOSIS — J309 Allergic rhinitis, unspecified: Secondary | ICD-10-CM

## 2024-03-16 DIAGNOSIS — G4733 Obstructive sleep apnea (adult) (pediatric): Secondary | ICD-10-CM | POA: Diagnosis not present

## 2024-03-16 MED ORDER — ALBUTEROL SULFATE HFA 108 (90 BASE) MCG/ACT IN AERS: 1.0000 | INHALATION_SPRAY | Freq: Four times a day (QID) | RESPIRATORY_TRACT | 2 refills | Status: AC | PRN

## 2024-03-16 NOTE — Patient Instructions (Addendum)
 Continue on Wixela inhaler 1 puff twice daily, rinse after use Albuterol  inhaler as needed for wheezing Continue on Zyrtec 10 mg daily in am .  Chlorpheniramine 4mg  At bedtime  As needed  drainage (Chlor tabs )  Continue on Flonase  daily   Set up for home sleep study.  Work on healthy weight loss  Do not drive if sleepy   Follow up in 6 weeks and As needed  -Virtual Friday clinic

## 2024-03-16 NOTE — Progress Notes (Signed)
 "  @Patient  ID: Dawn Galvan, female    DOB: Jun 26, 1975, 49 y.o.   MRN: [id number]  Chief Complaint  Patient presents with   Medical Management of Chronic Issues    Referring provider: Allwardt, Mardy Dawn Galvan  HPI: 49 yo female minimum smoking history seen for initial pulmonary consult October 2021 for post-COVID shortness of breath felt to have underlying asthma COVID-19 infection in August 2021 with COVID-pneumonia requiring hospitalization treated with remdesivir , Decadron  and Baricitinib  Medical history significant for obesity status post bariatric surgery, prediabetes, sleep apnea CPAP intolerant    TEST/EVENTS : Reviewed 03/16/2024  High-resolution CT chest December 25, 2019 showed minimal subpleural groundglass in the lower lobes, tiny pulmonary nodules maximum 3 mm.   Chest x-ray August 05, 2021 showed clear lungs   PFTs January 31, 2020-FEV1 83%, ratio 86, FVC 79%, no significant bronchodilator response, DLCO 83%   FENO today in office is 11 ppb .    Discussed the use of AI scribe software for clinical note transcription with the patient, who gave verbal consent to proceed.  History of Present Illness Dawn Galvan is a 49 year old female with severe sleep apnea who presents for follow-up and evaluation of her sleep apnea. She was referred by her primary doctor for evaluation of her sleep apnea.  She was diagnosed with severe sleep apnea in 2016, experiencing more than thirty episodes per hour. Despite being prescribed a CPAP machine, she did not use it due to claustrophobia, as she cannot tolerate having her nose and mouth covered simultaneously. She experiences symptoms of insufficient sleep, waking up twice a night, feeling very tired, and having choking and coughing episodes during sleep. Daytime sleepiness is significant, with near episodes of falling asleep at work. She uses Benadryl  nightly to aid sleep onset. Her weight fluctuates significantly, and there is  suspicion of a link between her weight and sleep apnea severity.  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 Symptoms worsen with colds, which she currently has, and she manages these with Nyquil, Benadryl , and Vicks. Her current medications include Flonase  nightly and at work, Zyrtec daily, and Wixela twice daily. She does not currently have an albuterol  inhaler but has used it in the past.  She has struggled with weight management, having used phentermine  and Wegovy. Dawn made her sluggish and did not aid in weight loss, while phentermine  provided energy. She is followed by a weight management team at [facility].  She has a history of TMJ and experiences severe headaches that sometimes progress to migraines. She also experiences vertigo.  Her family history includes emphysema in her father and uncle, both of whom smoked. She smoked over thirty years ago but has not since. She had COVID pneumonia in 2021,      Allergies[1]  Immunization History  Administered Date(s) Administered   PFIZER(Purple Top)SARS-COV-2 Vaccination 01/28/2020, 02/18/2020   Tdap 03/16/2019    Past Medical History:  Diagnosis Date   Abnormal chest x-ray 03/24/2017   Acute hypoxemic respiratory failure (HCC) 10/19/2019   Carpal tunnel syndrome    right   Carpal tunnel syndrome of left wrist 07/2013   COVID-19 10/16/2019   DJD (degenerative joint disease)    Dysmenorrhea    Epistaxis 05/06/2020   Family history of breast cancer    PALB2 Mutation in Mother   Family history of prostate cancer    GERD (gastroesophageal reflux disease)    Headache(784.0)    migraines or  sinus   History of vertigo    states comes and goes; no current med.   Morbid obesity with BMI of 45.0-49.9, adult (HCC) 02/07/2014   Obesity    Osteoarthritis    Plantar fasciitis    Pneumonia due to COVID-19 virus 10/16/2019   Formatting of this note might be different from the original. Last  Assessment & Plan:  Formatting of this note might be different from the original. Patient seen via telehealth visit for COVID-19 infection. Comorbid obesity and pre-DM. Reports symptoms for the past week, and positive test 3 days ago. Complains of persistent fever with Tmax of 100.7, chills, mild SOB, body aches which are improving   PONV (postoperative nausea and vomiting)    Rash 08/10/2013   right thigh   Rash of neck 05/06/2020   Sleep apnea    does not wear cpap   Sore in mouth 05/06/2020   Sore throat 05/22/2019   Tenosynovitis     Tobacco History: Tobacco Use History[2] Counseling given: Not Answered   Outpatient Medications Prior to Visit  Medication Sig Dispense Refill   acetaminophen  (TYLENOL  8 HOUR) 650 MG CR tablet Take 1 tablet (650 mg total) by mouth every 8 (eight) hours as needed for pain. 30 tablet 0   Diclofenac  Sodium 3 % GEL Apply 1 Application topically every 4 (four) hours as needed (Apply topically every 4 hours as needed for pain). 60 g 3   escitalopram  (LEXAPRO ) 20 MG tablet Take 1 tablet (20 mg total) by mouth daily. 90 tablet 1   etonogestrel  (NEXPLANON ) 68 MG IMPL implant 1 each by Subdermal route once.     fluticasone  (FLONASE ) 50 MCG/ACT nasal spray INSTILL 1 SPRAY INTO BOTH NOSTRILS DAILY 48 mL 3   fluticasone -salmeterol (WIXELA INHUB) 250-50 MCG/ACT AEPB INHALE 1 PUFF INTO THE LUNGS TWICE A DAY 180 each 3   meloxicam  (MOBIC ) 15 MG tablet TAKE 1 TABLET (15 MG TOTAL) BY MOUTH DAILY. (Patient taking differently: Take 15 mg by mouth daily. Pt report PRN only) 30 tablet 0   omeprazole  (PRILOSEC) 20 MG capsule TAKE 1 CAPSULE BY MOUTH EVERY DAY 90 capsule 1   phentermine  30 MG capsule Take 30 mg by mouth daily.     Multiple Vitamin (MULTI-VITAMIN) tablet Take 1 tablet by mouth daily. (Patient not taking: Reported on 03/16/2024)     diclofenac  (VOLTAREN ) 75 MG EC tablet Take 1 tablet (75 mg total) by mouth 2 (two) times daily. 50 tablet 2   No  facility-administered medications prior to visit.     Review of Systems:   Constitutional:   No  weight loss, night sweats,  Fevers, chills, +fatigue, or  lassitude.  HEENT:   No headaches,  Difficulty swallowing,  Tooth/dental problems, or  Sore throat,                No sneezing, itching, ear ache, +nasal congestion, post nasal drip,   CV:  No chest pain,  Orthopnea, PND, swelling in lower extremities, anasarca, dizziness, palpitations, syncope.   GI  No heartburn, indigestion, abdominal pain, nausea, vomiting, diarrhea, change in bowel habits, loss of appetite, bloody stools.   Resp:   No chest wall deformity  Skin: no rash or lesions.  GU: no dysuria, change in color of urine, no urgency or frequency.  No flank pain, no hematuria   MS:  No joint pain or swelling.  No decreased range of motion.  No back pain.    Physical Exam  BP ROLLEN)  134/90   Pulse 76   Temp 98.1 F (36.7 C)   Ht 5' 4.5 (1.638 m) Comment: per pt  Wt (!) 311 lb 12.8 oz (141.4 kg)   SpO2 100% Comment: RA  BMI 52.69 kg/m   GEN: A/Ox3; pleasant , NAD, well nourished    HEENT:  [place]/AT, NOSE-clear, THROAT-clear, no lesions, no postnasal drip or exudate noted.   NECK:  Supple w/ fair ROM; no JVD; normal carotid impulses w/o bruits; no thyromegaly or nodules palpated; no lymphadenopathy.    RESP  Clear  P & A; w/o, wheezes/ rales/ or rhonchi. no accessory muscle use, no dullness to percussion  CARD:  RRR, no m/r/g, no peripheral edema, pulses intact, no cyanosis or clubbing.  GI:   Soft & nt; nml bowel sounds; no organomegaly or masses detected.   Musco: Warm bil, no deformities or joint swelling noted.   Neuro: alert, no focal deficits noted.    Skin: Warm, no lesions or rashes    Lab Results:Reviewed 03/16/2024   CBC     BNP No results found for: BNP  ProBNP No results found for: PROBNP  Imaging:   Administration History     None          Latest Ref Rng & Units  01/31/2020   12:04 PM  PFT Results  FVC-Pre L 2.43   FVC-Predicted Pre % 79   FVC-Post L 2.29   FVC-Predicted Post % 75   Pre FEV1/FVC % % 86   Post FEV1/FCV % % 87   FEV1-Pre L 2.08   FEV1-Predicted Pre % 83   FEV1-Post L 1.99   DLCO uncorrected ml/min/mmHg 18.00   DLCO UNC% % 83   DLCO corrected ml/min/mmHg 18.00   DLCO COR %Predicted % 83   DLVA Predicted % 125   TLC L 3.95   TLC % Predicted % 78   RV % Predicted % 99     No results found for: NITRICOXIDE      No data to display              Assessment & Plan:   Assessment and Plan Assessment & Plan Obstructive sleep apnea  -ongoing daily symptom burden Severe obstructive sleep apnea was diagnosed in 2016 with over 30 episodes per hour. Symptoms include fatigue, daytime sleepiness, nocturnal choking, and coughing. Weight fluctuations may contribute to the condition. Claustrophobia prevents CPAP use. She has no history of congestive heart failure or stroke. Benadryl  is used nightly for sleep initiation. Alternative CPAP masks were discussed, including a smaller nasal mask and one that covers the nose and mouth but not the top of the nose. A home sleep study is ordered to reassess severity and guide treatment options.  A follow-up appointment is scheduled in six weeks to discuss sleep study results.  Asthma-controlled  Asthma is exacerbated by work-related dust exposure and a current upper respiratory infection, causing increased wheezing and throat clearing, especially at night and during work hours. She uses a Wixela inhaler twice daily and Flonase  nightly, with Zyrtec taken daily at work. Albuterol  is prescribed as a rescue inhaler for acute symptoms, and chlorpheniramine is considered for postnasal drip.control for triggers   Upper respiratory infection  -resolving  A current upper respiratory infection presents with nasal congestion and throat clearing, likely worsened by the work environment and a recent cold.  Symptoms improve with Nyquil, Benadryl , and Vicks. Chlorpheniramine is considered for additional symptom relief.        Correne Lalani  Freemon Binford, NP 03/16/2024     [1]  Allergies Allergen Reactions   Doxycycline  Nausea And Vomiting    Dizziness    Other Itching and Other (See Comments)    Seasonal allergies- Itchy eyes, runny nose, etc..  [2]  Social History Tobacco Use  Smoking Status Former   Types: Cigarettes   Start date: 02/22/2009  Smokeless Tobacco Never   "

## 2024-03-21 ENCOUNTER — Ambulatory Visit: Admitting: Physician Assistant

## 2024-03-22 ENCOUNTER — Telehealth: Payer: Self-pay

## 2024-03-22 DIAGNOSIS — Z7189 Other specified counseling: Secondary | ICD-10-CM

## 2024-03-22 NOTE — Progress Notes (Signed)
 Complex Care Management Note  Care Guide Note 03/22/2024 Name: Dawn Galvan MRN: [id number] DOB: 1976/01/04  [name] is a 49 y.o. year old female who sees Allwardt, Mardy HERO, PA-C for primary care. I reached out to [employer] by phone today to offer complex care management services.  [name] was given information about Complex Care Management services today including:   The Complex Care Management services include support from the care team which includes your Nurse Care Manager, Clinical Social Worker, or Pharmacist.  The Complex Care Management team is here to help remove barriers to the health concerns and goals most important to you. Complex Care Management services are voluntary, and the patient may decline or stop services at any time by request to their care team member.   Complex Care Management Consent Status: Patient agreed to services and verbal consent obtained.   Follow up plan:  Telephone appointment with complex care management team member scheduled for:  03/31/2024  Encounter Outcome:  Patient Scheduled  Jeoffrey Buffalo , RMA     [place]  [facility], [facility] Guide  Direct Dial: [phone]  Website: [email or web address]

## 2024-03-23 ENCOUNTER — Encounter: Payer: Self-pay | Admitting: Physician Assistant

## 2024-03-30 ENCOUNTER — Telehealth: Payer: Self-pay | Admitting: *Deleted

## 2024-03-30 NOTE — Progress Notes (Unsigned)
 Complex Care Management Care Guide Note  03/30/2024 Name: Makinzy Cleere MRN: [id number] DOB: 08/14/1975  Lonell Rawland Pence is a 49 y.o. year old female who is a primary care patient of Allwardt, Alyssa M, PA-C and is actively engaged with the care management team. I reached out to [employer] by phone today to assist with canceling  with the RN Case Manager.  Follow up plan: Patient request to call back at a later date to reschedule.    Harlene Satterfield  [facility]  [facility], [facility] Guide  Direct Dial: [phone]  Fax [phone]

## 2024-03-31 ENCOUNTER — Other Ambulatory Visit: Payer: Self-pay

## 2024-03-31 NOTE — Patient Outreach (Signed)
 03/31/2024  Contacted patient who is a new referral for CCM program admission. Reached patient by phone and spoke briefly with her - patient had cancelled our telephone appointment which I had failed to notice (cancellation was a choice patient made prior to my calling). Nevertheless, the patient was extremely gracious in accepting my call and politely sharing her decision not to participate in a Care management program at this time.   Sashay Felling A. Gordy RN, BA, [facility], CRRN [place]  [facility] Population Health RN Care Manager Direct Dial: [phone]  Fax: [phone]

## 2024-04-12 ENCOUNTER — Encounter

## 2024-04-13 ENCOUNTER — Inpatient Hospital Stay: Admitting: Hematology and Oncology

## 2024-04-26 ENCOUNTER — Inpatient Hospital Stay: Attending: Hematology and Oncology | Admitting: Hematology and Oncology

## 2024-05-05 ENCOUNTER — Ambulatory Visit: Admitting: Adult Health
